# Patient Record
Sex: Male | Born: 1980 | Race: White | Hispanic: No | Smoking: Former smoker
Health system: Southern US, Community
[De-identification: ages and names within clinical notes are randomized; demographics above are authoritative.]

## PROBLEM LIST (undated history)

## (undated) ENCOUNTER — Ambulatory Visit (HOSPITAL_COMMUNITY): Admission: EM | Payer: BC Managed Care – PPO | Source: Home / Self Care

## (undated) ENCOUNTER — Ambulatory Visit (HOSPITAL_COMMUNITY): Admission: EM | Payer: BC Managed Care – PPO

## (undated) DIAGNOSIS — I10 Essential (primary) hypertension: Secondary | ICD-10-CM

## (undated) DIAGNOSIS — K259 Gastric ulcer, unspecified as acute or chronic, without hemorrhage or perforation: Secondary | ICD-10-CM

## (undated) DIAGNOSIS — J45909 Unspecified asthma, uncomplicated: Secondary | ICD-10-CM

## (undated) DIAGNOSIS — B1920 Unspecified viral hepatitis C without hepatic coma: Secondary | ICD-10-CM

---

## 2002-08-26 ENCOUNTER — Emergency Department (HOSPITAL_COMMUNITY): Admission: EM | Admit: 2002-08-26 | Discharge: 2002-08-26 | Payer: Self-pay | Admitting: Emergency Medicine

## 2003-06-06 ENCOUNTER — Emergency Department (HOSPITAL_COMMUNITY): Admission: AD | Admit: 2003-06-06 | Discharge: 2003-06-06 | Payer: Self-pay | Admitting: Family Medicine

## 2003-12-02 ENCOUNTER — Emergency Department (HOSPITAL_COMMUNITY): Admission: EM | Admit: 2003-12-02 | Discharge: 2003-12-02 | Payer: Self-pay | Admitting: *Deleted

## 2004-01-09 ENCOUNTER — Emergency Department (HOSPITAL_COMMUNITY): Admission: EM | Admit: 2004-01-09 | Discharge: 2004-01-10 | Payer: Self-pay

## 2004-01-12 ENCOUNTER — Emergency Department (HOSPITAL_COMMUNITY): Admission: EM | Admit: 2004-01-12 | Discharge: 2004-01-12 | Payer: Self-pay | Admitting: Emergency Medicine
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
## 2007-02-13 ENCOUNTER — Emergency Department (HOSPITAL_COMMUNITY): Admission: EM | Admit: 2007-02-13 | Discharge: 2007-02-13 | Payer: Self-pay | Admitting: Emergency Medicine

## 2009-10-03 ENCOUNTER — Emergency Department (HOSPITAL_BASED_OUTPATIENT_CLINIC_OR_DEPARTMENT_OTHER): Admission: EM | Admit: 2009-10-03 | Discharge: 2009-10-03 | Payer: Self-pay | Admitting: Emergency Medicine

## 2010-04-16 ENCOUNTER — Encounter: Payer: Self-pay | Admitting: Gastroenterology

## 2010-06-11 LAB — DIFFERENTIAL
Basophils Absolute: 0 10*3/uL (ref 0.0–0.1)
Basophils Relative: 0 % (ref 0–1)
Eosinophils Absolute: 0.1 10*3/uL (ref 0.0–0.7)
Eosinophils Relative: 1 % (ref 0–5)
Lymphocytes Relative: 13 % (ref 12–46)
Lymphs Abs: 1.5 10*3/uL (ref 0.7–4.0)
Monocytes Absolute: 0.7 10*3/uL (ref 0.1–1.0)
Monocytes Relative: 5 % (ref 3–12)
Neutro Abs: 9.9 10*3/uL — ABNORMAL HIGH (ref 1.7–7.7)
Neutrophils Relative %: 81 % — ABNORMAL HIGH (ref 43–77)

## 2010-06-11 LAB — COMPREHENSIVE METABOLIC PANEL
ALT: 10 U/L (ref 0–53)
AST: 19 U/L (ref 0–37)
Albumin: 4.2 g/dL (ref 3.5–5.2)
Alkaline Phosphatase: 76 U/L (ref 39–117)
BUN: 13 mg/dL (ref 6–23)
CO2: 28 mEq/L (ref 19–32)
Calcium: 9.1 mg/dL (ref 8.4–10.5)
Chloride: 106 mEq/L (ref 96–112)
Creatinine, Ser: 0.9 mg/dL (ref 0.4–1.5)
GFR calc Af Amer: >60 mL/min (ref >60)
GFR calc non Af Amer: >60 mL/min (ref >60)
Glucose, Bld: 108 mg/dL — ABNORMAL HIGH (ref 70–99)
Potassium: 4.5 mEq/L (ref 3.5–5.1)
Sodium: 143 mEq/L (ref 135–145)
Total Bilirubin: 0.9 mg/dL (ref 0.3–1.2)
Total Protein: 7.6 g/dL (ref 6.0–8.3)

## 2010-06-11 LAB — CBC
HCT: 47.3 % (ref 39.0–52.0)
Hemoglobin: 15.8 g/dL (ref 13.0–17.0)
MCH: 30.7 pg (ref 26.0–34.0)
MCHC: 33.4 g/dL (ref 30.0–36.0)
MCV: 91.9 fL (ref 78.0–100.0)
Platelets: 266 10*3/uL (ref 150–400)
RBC: 5.15 MIL/uL (ref 4.22–5.81)
RDW: 12.9 % (ref 11.5–15.5)
WBC: 12.2 10*3/uL — ABNORMAL HIGH (ref 4.0–10.5)

## 2010-06-11 LAB — URINALYSIS, ROUTINE W REFLEX MICROSCOPIC
Bilirubin Urine: NEGATIVE
Glucose, UA: NEGATIVE mg/dL
Hgb urine dipstick: NEGATIVE
Ketones, ur: 15 mg/dL — AB
Leukocytes, UA: NEGATIVE
Nitrite: NEGATIVE
Protein, ur: 30 mg/dL — AB
Specific Gravity, Urine: 1.028 (ref 1.005–1.030)
Urobilinogen, UA: 0.2 mg/dL (ref 0.0–1.0)
pH: 6 (ref 5.0–8.0)

## 2010-06-11 LAB — URINE MICROSCOPIC-ADD ON

## 2010-06-11 LAB — LIPASE, BLOOD: Lipase: 113 U/L (ref 23–300)

## 2011-01-02 LAB — COMPREHENSIVE METABOLIC PANEL
ALT: 16
AST: 33
Albumin: 4.4
Alkaline Phosphatase: 52
BUN: 10
CO2: 24
Calcium: 9.3
Chloride: 102
Creatinine, Ser: 0.84
GFR calc Af Amer: >60
GFR calc non Af Amer: >60
Glucose, Bld: 95
Potassium: 4
Sodium: 135
Total Bilirubin: 1.3 — ABNORMAL HIGH
Total Protein: 7.4

## 2011-01-02 LAB — POCT CARDIAC MARKERS
CKMB, poc: <1 — ABNORMAL LOW
Myoglobin, poc: 81.5
Operator id: 146091
Troponin i, poc: <0.05

## 2011-01-02 LAB — DIFFERENTIAL
Basophils Absolute: 0.1
Basophils Relative: 1
Eosinophils Absolute: 0.1 — ABNORMAL LOW
Eosinophils Relative: 1
Lymphocytes Relative: 17
Lymphs Abs: 1.7
Monocytes Absolute: 0.7
Monocytes Relative: 7
Neutro Abs: 7.5
Neutrophils Relative %: 75

## 2011-01-02 LAB — CBC
HCT: 44.2
Hemoglobin: 15.6
MCHC: 35.3
MCV: 94.1
Platelets: 294
RBC: 4.69
RDW: 12.2
WBC: 10

## 2011-01-02 LAB — LIPASE, BLOOD: Lipase: 22

## 2011-01-02 LAB — ETHANOL: Alcohol, Ethyl (B): <5

## 2012-08-17 ENCOUNTER — Emergency Department (HOSPITAL_COMMUNITY): Payer: Self-pay

## 2012-08-17 ENCOUNTER — Encounter (HOSPITAL_COMMUNITY): Payer: Self-pay | Admitting: *Deleted

## 2012-08-17 ENCOUNTER — Emergency Department (HOSPITAL_COMMUNITY)
Admission: EM | Admit: 2012-08-17 | Discharge: 2012-08-17 | Disposition: A | Discharge status: Home / Self Care | Payer: Self-pay | Attending: Emergency Medicine | Admitting: Emergency Medicine

## 2012-08-17 DIAGNOSIS — Y939 Activity, unspecified: Secondary | ICD-10-CM | POA: Insufficient documentation

## 2012-08-17 DIAGNOSIS — F17200 Nicotine dependence, unspecified, uncomplicated: Secondary | ICD-10-CM | POA: Insufficient documentation

## 2012-08-17 DIAGNOSIS — S8990XA Unspecified injury of unspecified lower leg, initial encounter: Secondary | ICD-10-CM | POA: Insufficient documentation

## 2012-08-17 DIAGNOSIS — Y929 Unspecified place or not applicable: Secondary | ICD-10-CM | POA: Insufficient documentation

## 2012-08-17 DIAGNOSIS — M25571 Pain in right ankle and joints of right foot: Secondary | ICD-10-CM

## 2012-08-17 MED ORDER — HYDROCODONE-ACETAMINOPHEN 5-325 MG PO TABS: 1.0000 | ORAL_TABLET | ORAL | Status: DC | PRN

## 2012-08-17 MED ORDER — OXYCODONE-ACETAMINOPHEN 5-325 MG PO TABS
2.0000 | ORAL_TABLET | Freq: Once | ORAL | Status: AC
  Administered 2012-08-17: 2 via ORAL
  Filled 2012-08-17: qty 2
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
## 2012-08-17 NOTE — Progress Notes (Signed)
Orthopedic Tech Progress Note Patient Details:  Jared [name] 01-22-1981 [id number] Ace wrap applied to Right ankle. Crutches fit for patient height and comfort.  Ortho Devices Type of Ortho Device: Ace wrap;Crutches Ortho Device/Splint Interventions: Application   Asia R Thompson 08/17/2012, 12:34 PM

## 2012-08-20 NOTE — ED Provider Notes (Signed)
Medical screening examination/treatment/procedure(s) were performed by non-physician practitioner and as supervising physician I was immediately available for consultation/collaboration.   Carleene Cooper III, MD 08/20/12 [phone]

## 2013-12-10 ENCOUNTER — Emergency Department (HOSPITAL_COMMUNITY)
Admission: EM | Admit: 2013-12-10 | Discharge: 2013-12-10 | Disposition: A | Discharge status: Home / Self Care | Payer: No Typology Code available for payment source | Attending: Emergency Medicine | Admitting: Emergency Medicine

## 2013-12-10 ENCOUNTER — Encounter (HOSPITAL_COMMUNITY): Payer: Self-pay | Admitting: Emergency Medicine

## 2013-12-10 DIAGNOSIS — K089 Disorder of teeth and supporting structures, unspecified: Secondary | ICD-10-CM | POA: Insufficient documentation

## 2013-12-10 DIAGNOSIS — Z791 Long term (current) use of non-steroidal anti-inflammatories (NSAID): Secondary | ICD-10-CM | POA: Insufficient documentation

## 2013-12-10 DIAGNOSIS — K047 Periapical abscess without sinus: Secondary | ICD-10-CM | POA: Insufficient documentation

## 2013-12-10 DIAGNOSIS — F17200 Nicotine dependence, unspecified, uncomplicated: Secondary | ICD-10-CM | POA: Insufficient documentation

## 2013-12-10 DIAGNOSIS — R110 Nausea: Secondary | ICD-10-CM | POA: Insufficient documentation

## 2013-12-10 HISTORY — DX: Unspecified viral hepatitis C without hepatic coma: B19.20

## 2013-12-10 HISTORY — DX: Unspecified asthma, uncomplicated: J45.909

## 2013-12-10 MED ORDER — ONDANSETRON 4 MG PO TBDP
8.0000 mg | ORAL_TABLET | Freq: Once | ORAL | Status: AC
  Administered 2013-12-10: 8 mg via ORAL
  Filled 2013-12-10: qty 2

## 2013-12-10 MED ORDER — OXYCODONE HCL 5 MG PO TABS: 5.0000 mg | ORAL_TABLET | Freq: Four times a day (QID) | ORAL | Status: DC | PRN

## 2013-12-10 MED ORDER — LORAZEPAM 2 MG/ML IJ SOLN
1.0000 mg | Freq: Once | INTRAMUSCULAR | Status: AC
  Administered 2013-12-10: 1 mg via INTRAMUSCULAR
  Filled 2013-12-10: qty 1

## 2013-12-10 MED ORDER — CLINDAMYCIN HCL 150 MG PO CAPS: 300.0000 mg | ORAL_CAPSULE | Freq: Three times a day (TID) | ORAL | Status: DC

## 2013-12-10 MED ORDER — HYDROMORPHONE HCL 1 MG/ML IJ SOLN
1.0000 mg | Freq: Once | INTRAMUSCULAR | Status: AC
Start: 2013-12-10 — End: 2013-12-10
  Administered 2013-12-10: 1 mg via INTRAMUSCULAR
  Filled 2013-12-10: qty 1

## 2013-12-10 MED ORDER — OXYCODONE-ACETAMINOPHEN 5-325 MG PO TABS: 1.0000 | ORAL_TABLET | Freq: Four times a day (QID) | ORAL | Status: DC | PRN
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
## 2013-12-10 NOTE — Discharge Instructions (Signed)
Dental Abscess A dental abscess is a collection of infected fluid (pus) from a bacterial infection in the inner part of the tooth (pulp). It usually occurs at the end of the tooth's root.  CAUSES   Severe tooth decay.  Trauma to the tooth that allows bacteria to enter into the pulp, such as a broken or chipped tooth. SYMPTOMS   Severe pain in and around the infected tooth.  Swelling and redness around the abscessed tooth or in the mouth or face.  Tenderness.  Pus drainage.  Bad breath.  Bitter taste in the mouth.  Difficulty swallowing.  Difficulty opening the mouth.  Nausea.  Vomiting.  Chills.  Swollen neck glands. DIAGNOSIS   A medical and dental history will be taken.  An examination will be performed by tapping on the abscessed tooth.  X-rays may be taken of the tooth to identify the abscess. TREATMENT The goal of treatment is to eliminate the infection. You may be prescribed antibiotic medicine to stop the infection from spreading. A root canal may be performed to save the tooth. If the tooth cannot be saved, it may be pulled (extracted) and the abscess may be drained.  HOME CARE INSTRUCTIONS  Only take over-the-counter or prescription medicines for pain, fever, or discomfort as directed by your caregiver.  Rinse your mouth (gargle) often with salt water ( tsp salt in 8 oz [250 ml] of warm water) to relieve pain or swelling.  Do not drive after taking pain medicine (narcotics).  Do not apply heat to the outside of your face.  Return to your dentist for further treatment as directed. SEEK MEDICAL CARE IF:  Your pain is not helped by medicine.  Your pain is getting worse instead of better. SEEK IMMEDIATE MEDICAL CARE IF:  You have a fever or persistent symptoms for more than 2-3 days.  You have a fever and your symptoms suddenly get worse.  You have chills or a very bad headache.  You have problems breathing or swallowing.  You have trouble  opening your mouth.  You have swelling in the neck or around the eye. Document Released: 03/12/2005 Document Revised: 12/05/2011 Document Reviewed: 06/20/2010 [facility] Patient Information 2015 [place], [place]. This information is not intended to replace advice given to you by your health care provider. Make sure you discuss any questions you have with your health care provider.  Return to the emergency room for fever, change in vision, redness to the face that rapidly spreads towards the eye, nausea or vomiting, difficulty swallowing or shortness of breath.  You may follow with the dentist listed above. Alternatively,  you may also contact Onalee Hua and Nuala Alpha who run a low-cost dental clinic at their phone number is [phone]. You may also call [phone]  Dental Assistance If the dentist on-call cannot see you, please use the resources below:   Patients with Medicaid: [facility] [phone] [address], [phone] [address] [phone]  If unable to pay, or uninsured, contact [facility] [phone]) or [facility] [phone] in [place], [phone] in [facility]) to become qualified for the adult dental clinic  Other Low-Cost Community Dental Services: [facility]- [address] Natasha Bence [place], [place], [address]    [phone], Ext. [phone]    2nd and 4th Thursday of the month at 6:30am    10 clients each day by appointment, can sometimes see walk-in     patients if someone does not show for an appointment [facility]  Center- [address] Ether Griffins [place], [place], [address]    [phone] [facility] [address], [place], [place], [address]    [phone]  [facility] Department- [phone] [facility] Department- [phone] [facility] [phone]

## 2013-12-10 NOTE — ED Notes (Signed)
Patient states abscessed tooth left lower broken tooth.   Patient states he has been doubling up on the pain medication he got from [facility] on 12/06/2013.

## 2013-12-11 NOTE — ED Provider Notes (Signed)
Medical screening examination/treatment/procedure(s) were performed by non-physician practitioner and as supervising physician I was immediately available for consultation/collaboration.  Khadeeja Elden, MD 12/11/13 0711 

## 2013-12-12 ENCOUNTER — Emergency Department (HOSPITAL_COMMUNITY)
Admission: EM | Admit: 2013-12-12 | Discharge: 2013-12-12 | Disposition: A | Discharge status: Home / Self Care | Payer: No Typology Code available for payment source | Attending: Emergency Medicine | Admitting: Emergency Medicine

## 2013-12-12 ENCOUNTER — Encounter (HOSPITAL_COMMUNITY): Payer: Self-pay | Admitting: Emergency Medicine

## 2013-12-12 DIAGNOSIS — F17200 Nicotine dependence, unspecified, uncomplicated: Secondary | ICD-10-CM | POA: Insufficient documentation

## 2013-12-12 DIAGNOSIS — Z8619 Personal history of other infectious and parasitic diseases: Secondary | ICD-10-CM | POA: Insufficient documentation

## 2013-12-12 DIAGNOSIS — M5430 Sciatica, unspecified side: Secondary | ICD-10-CM | POA: Insufficient documentation

## 2013-12-12 DIAGNOSIS — J45909 Unspecified asthma, uncomplicated: Secondary | ICD-10-CM | POA: Insufficient documentation

## 2013-12-12 DIAGNOSIS — M549 Dorsalgia, unspecified: Secondary | ICD-10-CM | POA: Insufficient documentation

## 2013-12-12 DIAGNOSIS — Z792 Long term (current) use of antibiotics: Secondary | ICD-10-CM | POA: Insufficient documentation

## 2013-12-12 DIAGNOSIS — M5440 Lumbago with sciatica, unspecified side: Secondary | ICD-10-CM

## 2013-12-12 DIAGNOSIS — Z79899 Other long term (current) drug therapy: Secondary | ICD-10-CM | POA: Insufficient documentation

## 2013-12-12 DIAGNOSIS — Z87828 Personal history of other (healed) physical injury and trauma: Secondary | ICD-10-CM | POA: Insufficient documentation

## 2013-12-12 MED ORDER — IBUPROFEN 400 MG PO TABS: 800.0000 mg | ORAL_TABLET | Freq: Once | ORAL | Status: DC

## 2013-12-12 MED ORDER — KETOROLAC TROMETHAMINE 60 MG/2ML IM SOLN
60.0000 mg | Freq: Once | INTRAMUSCULAR | Status: AC
  Administered 2013-12-12: 60 mg via INTRAMUSCULAR
  Filled 2013-12-12: qty 2

## 2013-12-12 MED ORDER — METHOCARBAMOL 500 MG PO TABS: 500.0000 mg | ORAL_TABLET | Freq: Two times a day (BID) | ORAL | Status: DC

## 2013-12-12 MED ORDER — IBUPROFEN 800 MG PO TABS: 800.0000 mg | ORAL_TABLET | Freq: Three times a day (TID) | ORAL | Status: DC

## 2013-12-12 NOTE — Discharge Instructions (Signed)
Followup with your primary care physician for your back pain. Refer to resource guide below to find primary care physician. Followup with orthopedics.  Sciatica Sciatica is pain, weakness, numbness, or tingling along the path of the sciatic nerve. The nerve starts in the lower back and runs down the back of each leg. The nerve controls the muscles in the lower leg and in the back of the knee, while also providing sensation to the back of the thigh, lower leg, and the sole of your foot. Sciatica is a symptom of another medical condition. For instance, nerve damage or certain conditions, such as a herniated disk or bone spur on the spine, pinch or put pressure on the sciatic nerve. This causes the pain, weakness, or other sensations normally associated with sciatica. Generally, sciatica only affects one side of the body. CAUSES   Herniated or slipped disc.  Degenerative disk disease.  A pain disorder involving the narrow muscle in the buttocks (piriformis syndrome).  Pelvic injury or fracture.  Pregnancy.  Tumor (rare). SYMPTOMS  Symptoms can vary from mild to very severe. The symptoms usually travel from the low back to the buttocks and down the back of the leg. Symptoms can include:  Mild tingling or dull aches in the lower back, leg, or hip.  Numbness in the back of the calf or sole of the foot.  Burning sensations in the lower back, leg, or hip.  Sharp pains in the lower back, leg, or hip.  Leg weakness.  Severe back pain inhibiting movement. These symptoms may get worse with coughing, sneezing, laughing, or prolonged sitting or standing. Also, being overweight may worsen symptoms. DIAGNOSIS  Your caregiver will perform a physical exam to look for common symptoms of sciatica. He or she may ask you to do certain movements or activities that would trigger sciatic nerve pain. Other tests may be performed to find the cause of the sciatica. These may include:  Blood  tests.  X-rays.  Imaging tests, such as an MRI or CT scan. TREATMENT  Treatment is directed at the cause of the sciatic pain. Sometimes, treatment is not necessary and the pain and discomfort goes away on its own. If treatment is needed, your caregiver may suggest:  Over-the-counter medicines to relieve pain.  Prescription medicines, such as anti-inflammatory medicine, muscle relaxants, or narcotics.  Applying heat or ice to the painful area.  Steroid injections to lessen pain, irritation, and inflammation around the nerve.  Reducing activity during periods of pain.  Exercising and stretching to strengthen your abdomen and improve flexibility of your spine. Your caregiver may suggest losing weight if the extra weight makes the back pain worse.  Physical therapy.  Surgery to eliminate what is pressing or pinching the nerve, such as a bone spur or part of a herniated disk. HOME CARE INSTRUCTIONS   Only take over-the-counter or prescription medicines for pain or discomfort as directed by your caregiver.  Apply ice to the affected area for 20 minutes, 3-4 times a day for the first 48-72 hours. Then try heat in the same way.  Exercise, stretch, or perform your usual activities if these do not aggravate your pain.  Attend physical therapy sessions as directed by your caregiver.  Keep all follow-up appointments as directed by your caregiver.  Do not wear high heels or shoes that do not provide proper support.  Check your mattress to see if it is too soft. A firm mattress may lessen your pain and discomfort. SEEK IMMEDIATE MEDICAL  CARE IF:   You lose control of your bowel or bladder (incontinence).  You have increasing weakness in the lower back, pelvis, buttocks, or legs.  You have redness or swelling of your back.  You have a burning sensation when you urinate.  You have pain that gets worse when you lie down or awakens you at night.  Your pain is worse than you have  experienced in the past.  Your pain is lasting longer than 4 weeks.  You are suddenly losing weight without reason. MAKE SURE YOU:  Understand these instructions.  Will watch your condition.  Will get help right away if you are not doing well or get worse. Document Released: 03/06/2001 Document Revised: 09/11/2011 Document Reviewed: 07/22/2011 [facility] Patient Information 2015 [place], [place]. This information is not intended to replace advice given to you by your health care provider. Make sure you discuss any questions you have with your health care provider.   Emergency Department Resource Guide 1) Find a Doctor and Pay Out of Pocket Although you won't have to find out who is covered by your insurance plan, it is a good idea to ask around and get recommendations. You will then need to call the office and see if the doctor you have chosen will accept you as a new patient and what types of options they offer for patients who are self-pay. Some doctors offer discounts or will set up payment plans for their patients who do not have insurance, but you will need to ask so you aren't surprised when you get to your appointment.  2) Contact Your Local Health Department Not all health departments have doctors that can see patients for sick visits, but many do, so it is worth a call to see if yours does. If you don't know where your local health department is, you can check in your phone book. The CDC also has a tool to help you locate your state's health department, and many state websites also have listings of all of their local health departments.  3) Find a Walk-in Clinic If your illness is not likely to be very severe or complicated, you may want to try a walk in clinic. These are popping up all over the country in pharmacies, drugstores, and shopping centers. They're usually staffed by nurse practitioners or physician assistants that have been trained to treat common illnesses and complaints.  They're usually fairly quick and inexpensive. However, if you have serious medical issues or chronic medical problems, these are probably not your best option.  No Primary Care Doctor: - Call Health Connect at  [phone] - they can help you locate a primary care doctor that  accepts your insurance, provides certain services, etc. - Physician Referral Service- [phone]  Chronic Pain Problems: Organization         Address  Phone   Notes  [facility]  [phone] Patients need to be referred by their primary care doctor.   Medication Assistance: Organization         Address  Phone   Notes  [facility] Medication [facility] [address] [place] [place], [place] [address] [phone] --Must be a resident of [facility] -- Must have NO insurance coverage whatsoever (no Medicaid/ Medicare, etc.) -- The pt. MUST have a primary care doctor that directs their care regularly and follows them in the community   [facility]  [phone]   [employer]  [phone]    Agencies that  provide inexpensive medical care: Organization         Address  Phone   Notes  [facility]  [phone]   [facility]    [phone]   [facility] [address] [place], [place] [address] [phone]   [facility] [address] [place] [phone]   Planned Parenthood    [phone]   [facility]    [phone]   [facility]  [address], [place] Phone:  [phone], Fax:  [phone] Hours of Operation:  9 am - 6 pm, M-F.  Also accepts Medicaid/Medicare and self-pay.  [facility]  [address], [place] Phone: [phone], Fax: [phone]. Hours of Operation:  8:30 am - 5:30 pm, M-F.  Also accepts Medicaid and self-pay.  [facility] [place] [address] [place]  Phone: [phone]   [facility] [address] Natasha Bence [place], [place] [phone], Ext. [phone] Mondays & Thursdays: 7-9 AM.  First 15 patients are seen on a first come, first serve basis.    Medicaid-accepting [facility] Providers:  Organization         Address  Phone   Notes  [facility] [address],  [phone] Also accepts self-pay patients.  [facility] [address] Laurell Josephs [place], [place]  [phone]   [facility] [address], [place] [phone]   [facility] [address], [place] [phone]   Renaye Rakers [address], [place]   [phone] Only accepts [place] Access [place] patients after they have their name applied to their card.   Self-Pay (no insurance) in [facility]:  Organization         Address  Phone   Notes  Sickle Cell Patients, [facility] Internal Medicine [address] [place], [place] [phone]   [facility] [address] [place], [place] [phone]   [facility] [place]  [address], [place] [phone]   [facility]/Dr. Osei-Bonsu  [address] [place] or [address], [place] [phone] Phone number for both [place] and [place] locations is the same.  Urgent Medical and [facility] [address] [place] [phone]   [facility] [address], [place] or [address] [phone]   [facility] [address], [place] [phone], phone; [phone], fax Sees patients 1st and 3rd Saturday of every month.  Must not qualify for public or private insurance (i.e. Medicaid, Medicare, [place] Health Choice, Veterans' Benefits)  Household income should be no more than 200% of the poverty level The clinic cannot  treat you if you are pregnant or think you are pregnant  Sexually transmitted diseases are not treated at the clinic.    Dental Care: Organization         Address  Phone  Notes  [facility] Department of [facility] [address] [place], [place] [phone] Accepts children up to age 73 who are enrolled in [place] or [place] Health Choice; pregnant women with a Medicaid card; and children who have applied for Medicaid or [place] Health  Choice, but were declined, whose parents can pay a reduced fee at time of service.  [facility] Department of [facility]  [address], [place] [phone] Accepts children up to age 65 who are enrolled in [place] or [place] Health Choice; pregnant women with a Medicaid card; and children who have applied for Medicaid or [place] Health Choice, but were declined, whose parents can pay a reduced fee at time of service.  [facility]  [address] [place], [place] [phone] Patients are seen by appointment only. Walk-ins are not accepted. [facility] will see patients 72 years of age and older. Monday - Tuesday (8am-5pm) Most Wednesdays (8:30-5pm) $30 per visit, cash only  [facility] Access [facility]  [address], [facility] [phone] Patients are seen by appointment only. Walk-ins are not accepted. [facility] will see patients 34 years of age and older. One Wednesday Evening (Monthly: Volunteer Based).  $30 per visit, cash only  [employer]  [phone] for adults; Children under age 39, call Graduate Pediatric Dentistry at [phone]. Children aged 56-14, please call [phone] to request a pediatric application.  Dental services are provided in all areas of dental care including fillings, crowns and bridges, complete and partial dentures, implants, gum treatment, root canals, and extractions. Preventive care is also provided.  Treatment is provided to both adults and children. Patients are selected via a lottery and there is often a waiting list.   [facility] [address], [place]  [phone] [email or web address]   [facility] [address] [place], [place] [phone], Ext. [phone] Second and Fourth Thursday of each month, opens at 6:30 AM; Clinic ends at 9 AM.  Patients are seen on a first-come first-served basis, and a limited number are seen during each clinic.   [facility]  [address] Ether Griffins [place], [place] [phone]   Eligibility Requirements You must have lived in [place], [place], or [place] counties for at least the last three months.   You cannot be eligible for state or federal sponsored [place], including [employer], [place], or [employer].   You generally cannot be eligible for healthcare insurance through your employer.    How to apply: Eligibility screenings are held every Tuesday and Wednesday afternoon from 1:00 pm until 4:00 pm. You do not need an appointment for the interview!  [facility] [address], [place], [place] [phone]   [facility] Health Department  [phone]   [facility] Health Department  [phone]   [facility]  [phone]    Behavioral Health Resources in the Community: Intensive Outpatient Programs Organization         Address  Phone  Notes  [facility] [address] [place], [place] [phone]   [facility] Outpatient [address] [place], [place] [phone]   [facility] [address], [place], [place]  [phone]   [facility] Mental Health [address]  [place], [place] [phone] or [phone]   Substance Abuse Resources Organization         Address  Phone  Notes  [facility]  [phone]   [facility]   [phone]   [facility] [place]  [phone]   Floydene Flock  [phone]   Residential & Outpatient Substance Abuse Program  [phone]  Psychological Services Organization         Address  Phone  Notes  [facility] Health  [phone]   [facility]  [phone]   [facility] Mental Health [phone], [place] [phone] or [phone]    Mobile Crisis Teams Organization         Address  Phone  Notes  [facility], Mobile Crisis Care Unit  [phone]   [facility]  [address] [place], [place] [phone]   Doristine Locks [address] [place] [phone]    Self-Help/Support Groups Organization         Address  Phone             Notes  [facility] [place] - variety of support groups  336- [phone] Call for more information  Narcotics Anonymous (NA), [facility] [address], [employer] [place]  2 meetings at this location   Statistician         Address  Phone  Notes  [facility] [address],    [place]  [phone]   [facility]  [address] [place] [address], [place], [place] [phone]   [facility] [address] [place], [place] [phone] Admissions: 8am-3pm M-F  [facility] [address]    [place], [place] [phone]   [facility] [address] [place], [place], [place] [phone]   [facility] [address]  [place], [place] [phone]   [facility] - Intensive Outpatient [address] Laurell Josephs [address], [place], [place] [phone]   [facility] ([facility] [address] [place]  [place], [place] [phone] or [phone]   [facility] ([facility]) [address] [place], [place] [phone] Accepts Medicaid  [facility] [address],  [place] [phone] Substance  Abuse/Addiction Treatment   [facility] Organization         Address  Phone  Notes  [facility]  [phone]   Angie Fava, PhD [address] Ervin Knack [place], [place]   [phone] or [phone]   [facility] Behavioral   [address] [place], [place] [phone]   [facility] [address], [place], [place] [phone] Insurance/Medicaid/sponsorship through [facility] and Families [address]                                    [place], [place] [phone] Therapy/tele-psych/case  [facility] [address], [place] [phone]    Dr. Lolly Mustache  [phone]   [facility]  [employer] [facility] Dept. 1) [address], [place] 2) [address] [place] 3)  [address], [place] [phone]  [phone]   [facility] Child Abuse Hotline (321)082-4447 or [phone] (After Hours)
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
## 2013-12-12 NOTE — ED Notes (Signed)
Case manager at bed side to give PT letter for match program. Pt instructed to go to [facility] on [address].

## 2013-12-13 NOTE — ED Provider Notes (Signed)
Medical screening examination/treatment/procedure(s) were performed by non-physician practitioner and as supervising physician I was immediately available for consultation/collaboration.   EKG Interpretation None        Candyce Churn III, MD 12/13/13 1535

## 2013-12-18 ENCOUNTER — Emergency Department (HOSPITAL_COMMUNITY)
Admission: EM | Admit: 2013-12-18 | Discharge: 2013-12-18 | Disposition: A | Discharge status: Home / Self Care | Payer: Self-pay | Attending: Emergency Medicine | Admitting: Emergency Medicine

## 2013-12-18 ENCOUNTER — Emergency Department (HOSPITAL_COMMUNITY): Payer: No Typology Code available for payment source

## 2013-12-18 ENCOUNTER — Encounter (HOSPITAL_COMMUNITY): Payer: Self-pay | Admitting: Emergency Medicine

## 2013-12-18 DIAGNOSIS — J45909 Unspecified asthma, uncomplicated: Secondary | ICD-10-CM | POA: Insufficient documentation

## 2013-12-18 DIAGNOSIS — R748 Abnormal levels of other serum enzymes: Secondary | ICD-10-CM | POA: Insufficient documentation

## 2013-12-18 DIAGNOSIS — R1084 Generalized abdominal pain: Secondary | ICD-10-CM | POA: Insufficient documentation

## 2013-12-18 DIAGNOSIS — Z8619 Personal history of other infectious and parasitic diseases: Secondary | ICD-10-CM | POA: Insufficient documentation

## 2013-12-18 DIAGNOSIS — R112 Nausea with vomiting, unspecified: Secondary | ICD-10-CM | POA: Insufficient documentation

## 2013-12-18 DIAGNOSIS — Z791 Long term (current) use of non-steroidal anti-inflammatories (NSAID): Secondary | ICD-10-CM | POA: Insufficient documentation

## 2013-12-18 DIAGNOSIS — R197 Diarrhea, unspecified: Secondary | ICD-10-CM | POA: Insufficient documentation

## 2013-12-18 DIAGNOSIS — Z87891 Personal history of nicotine dependence: Secondary | ICD-10-CM | POA: Insufficient documentation

## 2013-12-18 DIAGNOSIS — Z792 Long term (current) use of antibiotics: Secondary | ICD-10-CM | POA: Insufficient documentation

## 2013-12-18 DIAGNOSIS — Z79899 Other long term (current) drug therapy: Secondary | ICD-10-CM | POA: Insufficient documentation

## 2013-12-18 DIAGNOSIS — K044 Acute apical periodontitis of pulpal origin: Secondary | ICD-10-CM | POA: Insufficient documentation

## 2013-12-18 HISTORY — DX: Gastric ulcer, unspecified as acute or chronic, without hemorrhage or perforation: K25.9

## 2013-12-18 LAB — COMPREHENSIVE METABOLIC PANEL
ALT: 48 U/L (ref 0–53)
AST: 32 U/L (ref 0–37)
Albumin: 4.5 g/dL (ref 3.5–5.2)
Alkaline Phosphatase: 71 U/L (ref 39–117)
Anion gap: 13 (ref 5–15)
BUN: 15 mg/dL (ref 6–23)
CALCIUM: 9.8 mg/dL (ref 8.4–10.5)
CO2: 27 mEq/L (ref 19–32)
Chloride: 102 mEq/L (ref 96–112)
Creatinine, Ser: 0.81 mg/dL (ref 0.50–1.35)
GFR calc Af Amer: >90 mL/min (ref >90)
GFR calc non Af Amer: >90 mL/min (ref >90)
Glucose, Bld: 103 mg/dL — ABNORMAL HIGH (ref 70–99)
Potassium: 4.5 mEq/L (ref 3.7–5.3)
SODIUM: 142 meq/L (ref 137–147)
TOTAL PROTEIN: 8.3 g/dL (ref 6.0–8.3)
Total Bilirubin: 0.5 mg/dL (ref 0.3–1.2)

## 2013-12-18 LAB — CBC WITH DIFFERENTIAL/PLATELET
BASOS ABS: 0 10*3/uL (ref 0.0–0.1)
BASOS PCT: 0 % (ref 0–1)
EOS ABS: 0.1 10*3/uL (ref 0.0–0.7)
Eosinophils Relative: 1 % (ref 0–5)
HCT: 45.4 % (ref 39.0–52.0)
Hemoglobin: 16.2 g/dL (ref 13.0–17.0)
Lymphocytes Relative: 20 % (ref 12–46)
Lymphs Abs: 1.9 10*3/uL (ref 0.7–4.0)
MCH: 31 pg (ref 26.0–34.0)
MCHC: 35.7 g/dL (ref 30.0–36.0)
MCV: 87 fL (ref 78.0–100.0)
Monocytes Absolute: 0.6 10*3/uL (ref 0.1–1.0)
Monocytes Relative: 6 % (ref 3–12)
Neutro Abs: 7.1 10*3/uL (ref 1.7–7.7)
Neutrophils Relative %: 73 % (ref 43–77)
PLATELETS: 281 10*3/uL (ref 150–400)
RBC: 5.22 MIL/uL (ref 4.22–5.81)
RDW: 12.2 % (ref 11.5–15.5)
WBC: 9.7 10*3/uL (ref 4.0–10.5)

## 2013-12-18 LAB — URINALYSIS, ROUTINE W REFLEX MICROSCOPIC
Bilirubin Urine: NEGATIVE
GLUCOSE, UA: NEGATIVE mg/dL
Hgb urine dipstick: NEGATIVE
Ketones, ur: 40 mg/dL — AB
LEUKOCYTES UA: NEGATIVE
Nitrite: NEGATIVE
PROTEIN: NEGATIVE mg/dL
Specific Gravity, Urine: 1.02 (ref 1.005–1.030)
Urobilinogen, UA: 0.2 mg/dL (ref 0.0–1.0)
pH: 7.5 (ref 5.0–8.0)

## 2013-12-18 LAB — LIPASE, BLOOD: Lipase: 62 U/L — ABNORMAL HIGH (ref 11–59)

## 2013-12-18 MED ORDER — IOHEXOL 300 MG/ML  SOLN
100.0000 mL | Freq: Once | INTRAMUSCULAR | Status: AC | PRN
  Administered 2013-12-18: 100 mL via INTRAVENOUS

## 2013-12-18 MED ORDER — ONDANSETRON 8 MG PO TBDP: 8.0000 mg | ORAL_TABLET | Freq: Three times a day (TID) | ORAL | Status: DC | PRN

## 2013-12-18 MED ORDER — METOCLOPRAMIDE HCL 10 MG PO TABS: 10.0000 mg | ORAL_TABLET | Freq: Four times a day (QID) | ORAL | Status: DC

## 2013-12-18 MED ORDER — HYDROMORPHONE HCL 1 MG/ML IJ SOLN
1.0000 mg | Freq: Once | INTRAMUSCULAR | Status: AC
  Administered 2013-12-18: 1 mg via INTRAVENOUS
  Filled 2013-12-18: qty 1

## 2013-12-18 MED ORDER — ONDANSETRON HCL 4 MG/2ML IJ SOLN
4.0000 mg | Freq: Once | INTRAMUSCULAR | Status: AC
Start: 2013-12-18 — End: 2013-12-18
  Administered 2013-12-18: 4 mg via INTRAVENOUS
  Filled 2013-12-18: qty 2

## 2013-12-18 MED ORDER — SODIUM CHLORIDE 0.9 % IV BOLUS (SEPSIS)
1000.0000 mL | Freq: Once | INTRAVENOUS | Status: AC
  Administered 2013-12-18: 1000 mL via INTRAVENOUS

## 2013-12-18 MED ORDER — PROMETHAZINE HCL 25 MG/ML IJ SOLN
25.0000 mg | Freq: Once | INTRAMUSCULAR | Status: AC
  Administered 2013-12-18: 25 mg via INTRAVENOUS
  Filled 2013-12-18: qty 1

## 2013-12-18 MED ORDER — IOHEXOL 300 MG/ML  SOLN
50.0000 mL | Freq: Once | INTRAMUSCULAR | Status: AC | PRN
  Administered 2013-12-18: 50 mL via ORAL

## 2013-12-18 NOTE — ED Notes (Signed)
MD at bedside. 

## 2013-12-18 NOTE — Progress Notes (Signed)
[facility] [employer],   Provided pt with a list of primary care resources and [facility] Card application to help patient establish primary care.

## 2013-12-18 NOTE — ED Notes (Signed)
Pt c/o generalized abdominal pain x 4 days and n/v/d x 2 days.  Pain score 9/10.  Pt reports he has had the pain previously and was told it was "Hep C flaring up or a liver issue."  Pt sts he has not taken anything to relieve pain.

## 2013-12-18 NOTE — ED Notes (Signed)
Patient transported to CT 

## 2013-12-18 NOTE — Discharge Instructions (Signed)
Abdominal Pain Many things can cause abdominal pain. Usually, abdominal pain is not caused by a disease and will improve without treatment. It can often be observed and treated at home. Your health care provider will do a physical exam and possibly order blood tests and X-rays to help determine the seriousness of your pain. However, in many cases, more time must pass before a clear cause of the pain can be found. Before that point, your health care provider may not know if you need more testing or further treatment. HOME CARE INSTRUCTIONS  Monitor your abdominal pain for any changes. The following actions may help to alleviate any discomfort you are experiencing:  Only take over-the-counter or prescription medicines as directed by your health care provider.  Do not take laxatives unless directed to do so by your health care provider.  Try a clear liquid diet (broth, tea, or water) as directed by your health care provider. Slowly move to a bland diet as tolerated. SEEK MEDICAL CARE IF:  You have unexplained abdominal pain.  You have abdominal pain associated with nausea or diarrhea.  You have pain when you urinate or have a bowel movement.  You experience abdominal pain that wakes you in the night.  You have abdominal pain that is worsened or improved by eating food.  You have abdominal pain that is worsened with eating fatty foods.  You have a fever. SEEK IMMEDIATE MEDICAL CARE IF:   Your pain does not go away within 2 hours.  You keep throwing up (vomiting).  Your pain is felt only in portions of the abdomen, such as the right side or the left lower portion of the abdomen.  You pass bloody or black tarry stools. MAKE SURE YOU:  Understand these instructions.   Will watch your condition.   Will get help right away if you are not doing well or get worse.  Document Released: 12/20/2004 Document Revised: 03/17/2013 Document Reviewed: 11/19/2012 [facility] Patient Information  2015 [place], [place]. This information is not intended to replace advice given to you by your health care provider. Make sure you discuss any questions you have with your health care provider. Clear Liquid Diet A clear liquid diet is a short-term diet. It is made up of liquids or solids that become liquids at room temperature. You should be able to see through the liquid. WHAT CAN I HAVE? You may have any of the following if your doctor says they are okay:  Vegetable juices or fruit juices that do not have pulp.  Coffee.  Tea.  Soda.  Clear broth or strained broth-based soups.  High-protein gelatins and flavored gelatins.  Sugar and honey.  Ices or frozen ice pops that do not have milk. If you are not sure whether you can have a certain liquid, ask your doctor. You may also ask your doctor about other clear liquid options. Document Released: 02/23/2008 Document Revised: 03/17/2013 Document Reviewed: 02/06/2013 [facility] Patient Information 2015 [place], [place]. This information is not intended to replace advice given to you by your health care provider. Make sure you discuss any questions you have with your health care provider.

## 2013-12-18 NOTE — ED Provider Notes (Addendum)
CSN: [id number]     Arrival date & time 12/18/13  1191 History   First MD Initiated Contact with Patient 12/18/13 [phone]     Chief Complaint  Patient presents with  . Abdominal Pain  . Emesis  . Diarrhea     (Consider location/radiation/quality/duration/timing/severity/associated sxs/prior Treatment) HPI 33 y.o. Male with abdominal pain, nausea, vomiting diarrhea for four days.  S/p root canal 4 days and on clindamycin.  States pain started first and points to diffuse upper abdomen.  States vomiting everything he puts in his mouth for two days.  Emesis is food or clear.  Loose stools,non bloody.  Denies fever, chills, or painful urination but endorses frequent urination. Patient denies any previous surgeryies or similar symptoms.  Denies alcohol or tobacco use.  Past Medical History  Diagnosis Date  . Asthma   . Hepatitis C    History reviewed. No pertinent past surgical history. History reviewed. No pertinent family history. History  Substance Use Topics  . Smoking status: Former Smoker    Types: Cigarettes  . Smokeless tobacco: Not on file  . Alcohol Use: No    Review of Systems  All other systems reviewed and are negative.     Allergies  Tylenol  Home Medications   Prior to Admission medications   Medication Sig Start Date End Date Taking? Authorizing Provider  clindamycin (CLEOCIN) 150 MG capsule Take 2 capsules (300 mg total) by mouth 3 (three) times daily. May dispense as  capsules 12/10/13   Mora Bellman, PA-C  ibuprofen (ADVIL,MOTRIN) 800 MG tablet Take 1 tablet (800 mg total) by mouth 3 (three) times daily. 12/12/13   Monte Fantasia, PA-C  methocarbamol (ROBAXIN) 500 MG tablet Take 1 tablet (500 mg total) by mouth 2 (two) times daily. 12/12/13   Monte Fantasia, PA-C  oxyCODONE (ROXICODONE) 5 MG immediate release tablet Take 1 tablet (5 mg total) by mouth every 6 (six) hours as needed for severe pain. 12/10/13   Ramon Dredge Merrell, PA-C   BP 141/79  Pulse 60   Temp(Src) 97.9 F (36.6 C) (Oral)  Resp 18  SpO2 98% Physical Exam  Nursing note and vitals reviewed. Constitutional: He is oriented to person, place, and time. He appears well-developed and well-nourished.  HENT:  Head: Normocephalic and atraumatic.  Right Ear: External ear normal.  Left Ear: External ear normal.  Nose: Nose normal.  Mouth/Throat: Oropharynx is clear and moist.  Eyes: Conjunctivae and EOM are normal. Pupils are equal, round, and reactive to light.  Neck: Normal range of motion. Neck supple.  Cardiovascular: Normal rate, regular rhythm, normal heart sounds and intact distal pulses.   Pulmonary/Chest: Effort normal and breath sounds normal. No respiratory distress. He has no wheezes. He exhibits no tenderness.  Abdominal: Soft. Bowel sounds are normal. He exhibits no distension and no mass. There is tenderness. There is no guarding.    Musculoskeletal: Normal range of motion.  Neurological: He is alert and oriented to person, place, and time. He has normal reflexes. He exhibits normal muscle tone. Coordination normal.  Skin: Skin is warm and dry.  Psychiatric: He has a normal mood and affect. His behavior is normal. Judgment and thought content normal.    ED Course  Procedures (including critical care time) Labs Review Labs Reviewed  CBC WITH DIFFERENTIAL  COMPREHENSIVE METABOLIC PANEL  LIPASE, BLOOD  URINALYSIS, ROUTINE W REFLEX MICROSCOPIC    Imaging Review No results found.   EKG Interpretation   Date/Time:  Friday December 18 2013 08:41:58 EDT Ventricular Rate:  62 PR Interval:  115 QRS Duration: 88 QT Interval:  402 QTC Calculation: 408 R Axis:   79 Text Interpretation:  Normal sinus rhythm No significant change since last  tracing 13 February 2007 Confirmed by Nolton Denis MD, Duwayne Heck [phone]) on  12/18/2013 9:02:44 AM      MDM   Final diagnoses:  Generalized abdominal pain    33 y.o. Male with diffuse abdominal pain nausea vomiting and  diarrhea.  Patient with multiple recent clindamycin rounds for dental infection and percocet prescriptions.  Patient taking fluids well here without vomiting.  Labs normal except mildly elevated lipase.  Patient with ct without acute changes.  Discussed findings with patient and family.  Plan reglan, zofran and referral for follow up with gi.   The patient appears reasonably screened and/or stabilized for discharge and I doubt any other medical condition or other [facility] requiring further screening, evaluation, or treatment in the ED at this time prior to discharge.   Hilario Quarry, MD 12/18/13 1143  Hilario Quarry, MD 12/18/13 1146

## 2014-01-20 ENCOUNTER — Ambulatory Visit: Payer: Self-pay | Admitting: Internal Medicine

## 2014-06-04 ENCOUNTER — Ambulatory Visit (HOSPITAL_COMMUNITY): Admission: AD | Admit: 2014-06-04 | Payer: Self-pay | Source: Home / Self Care | Admitting: Psychiatry

## 2014-06-04 ENCOUNTER — Emergency Department (HOSPITAL_COMMUNITY)
Admission: EM | Admit: 2014-06-04 | Discharge: 2014-06-05 | Disposition: A | Discharge status: Home / Self Care | Payer: Self-pay | Attending: Emergency Medicine | Admitting: Emergency Medicine

## 2014-06-04 ENCOUNTER — Encounter (HOSPITAL_COMMUNITY): Payer: Self-pay | Admitting: Emergency Medicine

## 2014-06-04 DIAGNOSIS — F1310 Sedative, hypnotic or anxiolytic abuse, uncomplicated: Secondary | ICD-10-CM | POA: Insufficient documentation

## 2014-06-04 DIAGNOSIS — Z79899 Other long term (current) drug therapy: Secondary | ICD-10-CM | POA: Insufficient documentation

## 2014-06-04 DIAGNOSIS — Z8619 Personal history of other infectious and parasitic diseases: Secondary | ICD-10-CM | POA: Insufficient documentation

## 2014-06-04 DIAGNOSIS — M791 Myalgia: Secondary | ICD-10-CM | POA: Insufficient documentation

## 2014-06-04 DIAGNOSIS — F1110 Opioid abuse, uncomplicated: Secondary | ICD-10-CM | POA: Insufficient documentation

## 2014-06-04 DIAGNOSIS — Z8719 Personal history of other diseases of the digestive system: Secondary | ICD-10-CM | POA: Insufficient documentation

## 2014-06-04 DIAGNOSIS — J45909 Unspecified asthma, uncomplicated: Secondary | ICD-10-CM | POA: Insufficient documentation

## 2014-06-04 DIAGNOSIS — Z792 Long term (current) use of antibiotics: Secondary | ICD-10-CM | POA: Insufficient documentation

## 2014-06-04 DIAGNOSIS — Z87891 Personal history of nicotine dependence: Secondary | ICD-10-CM | POA: Insufficient documentation

## 2014-06-04 DIAGNOSIS — F1210 Cannabis abuse, uncomplicated: Secondary | ICD-10-CM | POA: Insufficient documentation

## 2014-06-04 DIAGNOSIS — F1510 Other stimulant abuse, uncomplicated: Secondary | ICD-10-CM | POA: Insufficient documentation

## 2014-06-04 DIAGNOSIS — F419 Anxiety disorder, unspecified: Secondary | ICD-10-CM | POA: Insufficient documentation

## 2014-06-04 NOTE — ED Notes (Addendum)
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 Last used earlier yesterday.

## 2014-06-05 LAB — RAPID URINE DRUG SCREEN, HOSP PERFORMED
Amphetamines: POSITIVE — AB
Barbiturates: NOT DETECTED
Benzodiazepines: POSITIVE — AB
Cocaine: NOT DETECTED
OPIATES: NOT DETECTED
Tetrahydrocannabinol: POSITIVE — AB

## 2014-06-05 LAB — CBC
HEMATOCRIT: 39.4 % (ref 39.0–52.0)
HEMOGLOBIN: 14 g/dL (ref 13.0–17.0)
MCH: 30.9 pg (ref 26.0–34.0)
MCHC: 35.5 g/dL (ref 30.0–36.0)
MCV: 87 fL (ref 78.0–100.0)
PLATELETS: 268 10*3/uL (ref 150–400)
RBC: 4.53 MIL/uL (ref 4.22–5.81)
RDW: 12.6 % (ref 11.5–15.5)
WBC: 10.5 10*3/uL (ref 4.0–10.5)

## 2014-06-05 LAB — COMPREHENSIVE METABOLIC PANEL
ALBUMIN: 4.7 g/dL (ref 3.5–5.2)
ALK PHOS: 56 U/L (ref 39–117)
ALT: 19 U/L (ref 0–53)
ANION GAP: 7 (ref 5–15)
AST: 20 U/L (ref 0–37)
BUN: 15 mg/dL (ref 6–23)
CHLORIDE: 101 mmol/L (ref 96–112)
CO2: 30 mmol/L (ref 19–32)
Calcium: 9.8 mg/dL (ref 8.4–10.5)
Creatinine, Ser: 0.64 mg/dL (ref 0.50–1.35)
GFR calc Af Amer: >90 mL/min (ref >90)
GFR calc non Af Amer: >90 mL/min (ref >90)
Glucose, Bld: 102 mg/dL — ABNORMAL HIGH (ref 70–99)
POTASSIUM: 3.9 mmol/L (ref 3.5–5.1)
Sodium: 138 mmol/L (ref 135–145)
Total Bilirubin: 0.3 mg/dL (ref 0.3–1.2)
Total Protein: 8.3 g/dL (ref 6.0–8.3)

## 2014-06-05 LAB — ETHANOL

## 2014-06-05 LAB — SALICYLATE LEVEL: Salicylate Lvl: <4 mg/dL (ref 2.8–20.0)

## 2014-06-05 LAB — ACETAMINOPHEN LEVEL

## 2014-06-05 MED ORDER — NICOTINE 21 MG/24HR TD PT24: 21.0000 mg | MEDICATED_PATCH | Freq: Every day | TRANSDERMAL | Status: DC

## 2014-06-05 MED ORDER — LORAZEPAM 2 MG/ML IJ SOLN
1.0000 mg | Freq: Once | INTRAMUSCULAR | Status: AC
  Administered 2014-06-05: 1 mg via INTRAVENOUS
  Filled 2014-06-05: qty 1

## 2014-06-05 MED ORDER — ZOLPIDEM TARTRATE 5 MG PO TABS: 5.0000 mg | ORAL_TABLET | Freq: Every evening | ORAL | Status: DC | PRN

## 2014-06-05 MED ORDER — IBUPROFEN 200 MG PO TABS
600.0000 mg | ORAL_TABLET | Freq: Once | ORAL | Status: AC
  Administered 2014-06-05: 600 mg via ORAL
  Filled 2014-06-05: qty 3

## 2014-06-05 MED ORDER — ONDANSETRON HCL 4 MG PO TABS: 4.0000 mg | ORAL_TABLET | Freq: Three times a day (TID) | ORAL | Status: DC | PRN

## 2014-06-05 MED ORDER — LORAZEPAM 1 MG PO TABS
0.0000 mg | ORAL_TABLET | Freq: Four times a day (QID) | ORAL | Status: DC
  Administered 2014-06-05: 1 mg via ORAL
  Filled 2014-06-05: qty 1

## 2014-06-05 MED ORDER — LORAZEPAM 1 MG PO TABS: 0.0000 mg | ORAL_TABLET | Freq: Two times a day (BID) | ORAL | Status: DC

## 2014-06-05 MED ORDER — ONDANSETRON 4 MG PO TBDP
4.0000 mg | ORAL_TABLET | Freq: Once | ORAL | Status: AC
  Administered 2014-06-05: 4 mg via ORAL
  Filled 2014-06-05: qty 1

## 2014-06-05 NOTE — ED Notes (Addendum)
Pt. Is accepted at [facility], tel. [phone]. Advised to be transported after 8am today, per Ala Dach of [facility], Dr. Read Drivers aware.

## 2014-06-05 NOTE — Discharge Instructions (Signed)
Polysubstance Abuse °When people abuse more than one drug or type of drug it is called polysubstance or polydrug abuse. For example, many smokers also drink alcohol. This is one form of polydrug abuse. Polydrug abuse also refers to the use of a drug to counteract an unpleasant effect produced by another drug. It may also be used to help with withdrawal from another drug. People who take stimulants may become agitated. Sometimes this agitation is countered with a tranquilizer. This helps protect against the unpleasant side effects. Polydrug abuse also refers to the use of different drugs at the same time.  °Anytime drug use is interfering with normal living activities, it has become abuse. This includes problems with family and friends. Psychological dependence has developed when your mind tells you that the drug is needed. This is usually followed by physical dependence which has developed when continuing increases of drug are required to get the same feeling or "high". This is known as addiction or chemical dependency. A person's risk is much higher if there is a history of chemical dependency in the family. °SIGNS OF CHEMICAL DEPENDENCY °· You have been told by friends or family that drugs have become a problem. °· You fight when using drugs. °· You are having blackouts (not remembering what you do while using). °· You feel sick from using drugs but continue using. °· You lie about use or amounts of drugs (chemicals) used. °· You need chemicals to get you going. °· You are suffering in work performance or in school because of drug use. °· You get sick from use of drugs but continue to use anyway. °· You need drugs to relate to people or feel comfortable in social situations. °· You use drugs to forget problems. °"Yes" answered to any of the above signs of chemical dependency indicates there are problems. The longer the use of drugs continues, the greater the problems will become. °If there is a family history of  drug or alcohol use, it is best not to experiment with these drugs. Continual use leads to tolerance. After tolerance develops more of the drug is needed to get the same feeling. This is followed by addiction. With addiction, drugs become the most important part of life. It becomes more important to take drugs than participate in the other usual activities of life. This includes relating to friends and family. Addiction is followed by dependency. Dependency is a condition where drugs are now needed not just to get high, but to feel normal. °Addiction cannot be cured but it can be stopped. This often requires outside help and the care of professionals. Treatment centers are listed in the yellow pages under: Cocaine, Narcotics, and Alcoholics Anonymous. Most hospitals and clinics can refer you to a specialized care center. Talk to your caregiver if you need help. °Document Released: 11/01/2004 Document Revised: 06/04/2011 Document Reviewed: 03/12/2005 °ExitCare® Patient Information ©2015 [employer]. This information is not intended to replace advice given to you by your health care provider. Make sure you discuss any questions you have with your health care provider. ° ° ° °Emergency Department Resource Guide °1) Find a Doctor and Pay Out of Pocket °Although you won't have to find out who is covered by your insurance plan, it is a good idea to ask around and get recommendations. You will then need to call the office and see if the doctor you have chosen will accept you as a new patient and what types of options they offer for patients who   are self-pay. Some doctors offer discounts or will set up payment plans for their patients who do not have insurance, but you will need to ask so you aren't surprised when you get to your appointment. ° °2) Contact Your Local Health Department °Not all health departments have doctors that can see patients for sick visits, but many do, so it is worth a call to see if yours does. If  you don't know where your local health department is, you can check in your phone book. The CDC also has a tool to help you locate your state's health department, and many state websites also have listings of all of their local health departments. ° °3) Find a Walk-in Clinic °If your illness is not likely to be very severe or complicated, you may want to try a walk in clinic. These are popping up all over the country in pharmacies, drugstores, and shopping centers. They're usually staffed by nurse practitioners or physician assistants that have been trained to treat common illnesses and complaints. They're usually fairly quick and inexpensive. However, if you have serious medical issues or chronic medical problems, these are probably not your best option. ° °No Primary Care Doctor: °- Call [facility] at  832-8000 - they can help you locate a primary care doctor that  accepts your insurance, provides certain services, etc. °- Physician Referral Service- 1-800-533-3463 ° °Chronic Pain Problems: °Organization         Address  Phone   Notes  °[facility]  (336) 297-2271 Patients need to be referred by their primary care doctor.  ° °Medication Assistance: °Organization         Address  Phone   Notes  °[place] [facility] [address] °[place], [place] [address] (336) 641-8030 --Must be a resident of [place] °-- Must have NO insurance coverage whatsoever (no Medicaid/ Medicare, etc.) °-- The pt. MUST have a primary care doctor that directs their care regularly and follows them in the community °  °[facility]  (866) 331-1348   °[employer]  (888) 892-1162   ° °Agencies that provide inexpensive medical care: °Organization         Address  Phone   Notes  °[facility]  (336) 832-8035   °[facility]    (336) 832-7272   °[facility] [address] °[place], [place] [address] (336) 832-4777   °[facility] [place] [address], °[place] (336) 271-4999   °[facility]    (336) 373-0678   °[facility]    (336) 272-1050   °[facility] ° [address], [place] Phone:  (336) 832-4444, Fax:  (336) 832-4440 Hours of Operation:  9 am - 6 pm, M-F.  Also accepts Medicaid/Medicare and self-pay.  °[facility] ° [address], [place] Phone: (336) 832-3150, Fax: (336) 832-3151. Hours of Operation:  8:30 am - 5:30 pm, M-F.  Also accepts Medicaid and self-pay.  °[facility] [place] [address], [place] Phone: (336) 878-6027   °[facility] [address], [place], [place] (336)723-1848, Ext. 123 Mondays & Thursdays: 7-9 AM.  First 15 patients are seen on a first come, first serve basis. °  ° °Medicaid-accepting [place] Providers: ° °Organization         Address  Phone   Notes  °[facility] [address], [place] (336) 641-2100 Also accepts self-pay patients.  °[facility] [address], [place] ° (336) 856-9996   °[facility] [address], [place] (336) 288-8857   °[facility] [address] [place] [address], [place] (336) 299-7000   °Veita Bland [address], [place]  ° (336) 373-1557 Only accepts [place] Access Medicaid patients after they have their name applied to their card.  ° °Self-Pay (no insurance) in [place]: ° °Organization         Address  Phone   Notes  °Sickle Cell Patients, [facility] [address], [place] (336) 832-1970   °[facility] [address], [place] (336) 832-4400   °[facility] ° [address], [place] (336) 992-4800   °[facility]/Dr. Osei-Bonsu ° [address] [place] [address], [place] or [address], [place] (336) 841-8500  Phone number for both [place] and [place] locations is the same.  °[facility] [address], [place] (336) 299-0000   °[facility] [address] [place] [address], [place] or [address] (336) 852-7530 °(336) 878-2260   °[facility] [address], [place] (336) 350-1642, phone; (336) 294-5005, fax Sees patients 1st and 3rd Saturday of every month.  Must not qualify for public or private insurance (i.e. Medicaid, Medicare, [place] Health Choice, Veterans' Benefits) • Household income should be no more than 200% of the poverty level •The clinic cannot treat you if you are pregnant or think you are pregnant • Sexually transmitted diseases are not treated at the clinic.  ° ° °Dental Care: °Organization         Address  Phone  Notes  °[place] [facility] [address], [place] (336) 641-6152 Accepts children up to age 21 who are enrolled in Medicaid or [place] Health Choice; pregnant women with a Medicaid card; and children who have applied for Medicaid or [place] Health Choice, but were declined, whose parents can pay a reduced fee at time of service.  °[facility] [place]  [address], [place] (336) 641-7733 Accepts children up to age 21 who are enrolled in Medicaid or [place] Health Choice; pregnant women with a Medicaid card; and children who have applied for Medicaid or [place] Health Choice, but were declined, whose parents can pay a reduced fee at time of service.  °[facility] ° [address],  (336) 641-4533 Patients are seen by appointment only. Walk-ins are not accepted. [facility] will see patients 18 years of age and older. °Monday - Tuesday (8am-5pm) °Most Wednesdays (8:30-5pm) °$30 per visit, cash only  °[facility] ° [address], [place] (336) 641-4533 Patients are seen by appointment  only. Walk-ins are not accepted. [facility] will see patients 18 years of age and older. °One Wednesday Evening (Monthly: Volunteer Based).  $30 per visit, cash only  °[facility]  (919) 537-3737 for adults; Children under age 4, call Graduate Pediatric Dentistry at (919) [phone]. Children aged 4-14, please call (919) [phone] to request a pediatric application. ° Dental services are provided in all areas of dental care including fillings, crowns and bridges, complete and partial dentures, implants, gum treatment, root canals,   and extractions. Preventive care is also provided. Treatment is provided to both adults and children. °Patients are selected via a lottery and there is often a waiting list. °  °[facility] [address], °[place] ° (336) 763-8833 [email or web address] °  °[facility] [address], [place], [place] (336)723-1848, Ext. 123 Second and Fourth Thursday of each month, opens at 6:30 AM; Clinic ends at 9 AM.  Patients are seen on a first-come first-served basis, and a limited number are seen during each clinic.  ° °[facility] ° [address], [place], [place] (336) 723-7904   Eligibility Requirements °You must have lived in [place], [place], or [place] counties for at least the last three months. °  You cannot be eligible for state or federal sponsored healthcare insurance, including Veterans Administration, Medicaid, or Medicare. °  You generally cannot be eligible for healthcare insurance through your employer.  °  How to apply: °Eligibility screenings are held every Tuesday and Wednesday afternoon from 1:00 pm until 4:00 pm. You do not need an appointment for the interview!  °[facility] [address], [place], [place] 336-631-2330   °[facility] [facility]  336-342-8273   °[place]  336-703-3100   ° [facility]  336-570-6415   ° °Behavioral Health  Resources in the Community: °Intensive Outpatient Programs °Organization         Address  Phone  Notes  °[facility] [address], [place], [place] 336-878-6098   °[facility] [address], [place], [place] 336-832-9800   °[facility] [address], [place], [place] ° 336-882-2125   °[facility] [address],  °[place], [place] 1-800-853-5163 or 336-641-4981   °Substance Abuse Resources °Organization         Address  Phone  Notes  °[facility]  336-882-2125   °[facility]  336-784-9470   °[facility]  336-285-9073   °[facility]  336-845-3988   °Residential & Outpatient Substance Abuse Program  1-800-659-3381   °Psychological Services °Organization         Address  Phone  Notes  °[facility]  336- 832-9600   °[facility]  336- 378-7881   °[facility] [address], [place] 1-800-853-5163 or 336-641-4981   ° °Mobile Crisis Teams °Organization         Address  Phone  Notes  °[facility], Mobile Crisis Care Unit  1-877-626-1772   °Assertive °[facility] ° [address] [place], [place] 336-834-9664   °Sharon DeEsch [address] °[place] 336-554-5454   ° °Self-Help/Support Groups °Organization         Address  Phone             Notes  °[facility] [place] - variety of support groups  336- 373-1402 Call for more information  °Narcotics Anonymous (NA), [facility] [address], °[place]  2 meetings at this location  ° °Residential Treatment Programs °Organization         Address  Phone  Notes  °[facility] [address],    °[place]   1-866-801-8205   °[facility] ° [address], [place],  704-293-8524   °[facility] [address], [place] 336-845-3988 Admissions: 8am-3pm M-F  °[facility] [address]    °[place], [place] 336-841-1104   °[facility] [address], [place], [place] 336-379-7146   °[facility] [address]  °[place], [place] 336-285-9073   °[facility] Intensive Outpatient [address], [place], [place] 336-852-3033   °[facility] [address]  °[place], [place] 1-877-615-2722 or 336-784-9470   °[facility] ([facility] [address] [place], [place] 336-227-7417 Accepts Medicaid  °[facility] [address]  °[place] 1-800-659-3381 Substance Abuse/Addiction Treatment  ° °[place] Behavioral Health Resources °Organization         Address  Phone  Notes  °[facility]  (888) 581-9988   °Julie Brannon, PhD [address] [place], [place]   (336) 349-5553 or (336) 951-0000   °[facility]   [address] °[place], [place] (336) 349-4454   °[facility] [address], [place], [place] (336) 342-8316 Insurance/Medicaid/sponsorship through [facility]  °[facility] [address]                                    [place], [place] (336) 342-8316 Therapy/tele-psych/case  °[facility] [address]  ° [place], [place] (336) 349-2233    °Dr. Arfeen  (336) 349-4544   °[facility] [place]  [employer] [place] [facility] 1) [address], [place] °2) [address], [place] °3)  [address], [place] (336) 349-3220 °(336) 342-7768 ° °(336) 342-8140   °[place] [facility] (336) 342-1394 or (336) 342-3537 (After Hours)    ° ° ° °

## 2014-06-05 NOTE — BH Assessment (Signed)
Received call from [name] at [facility] who said Pt has been accepted. He can be transported by [facility] after 0800. Please call RN report when Pt is ready to leave at [phone]. Notified Dr. Read Drivers and Merlene Morse of acceptance.  Harlin Rain Ria Comment, [facility] Triage Specialist [phone]

## 2014-06-05 NOTE — BHH Counselor (Signed)
Contacted the following facilities for placement:  [facility]- per [place], bed available. Faxed clinical information [facility]- per Misty [place], check back later today for bed availability   Harlin Rain Ria Comment, [facility] Triage Specialist [phone]

## 2014-06-05 NOTE — ED Notes (Addendum)
Pt. In burgundy scrubs. Pt. Has 2 belongings bags. Pt. Has gray nike air snickers, 2 pairs white socks, 1 blue jean, 1 brown belt, 1 wallet(no money, platinum debit card, ID card, 1 beige long johns, 1 key chain, 1 white t-shirt. No cell phone. Pt. Belongings locked up at the nurses station in the yellow zone.

## 2014-06-05 NOTE — ED Provider Notes (Signed)
CSN: [id number]     Arrival date & time 06/04/14  2239 History   First MD Initiated Contact with Patient 06/05/14 [phone]     Chief Complaint  Patient presents with  . detox     (Consider location/radiation/quality/duration/timing/severity/associated sxs/prior Treatment) HPI Comments: Patient is a 34 year old male with a history of gastric ulcer and hepatitis C who presents to the emergency department for psychiatric evaluation for detox. Patient states that he was arrested today on drug charges for paraphernalia. He was also questioned by police about some inappropriate text messages to the 82 year old daughter of his wife which patient states that he never sent. Patient states that his arrest was a wake up call for him needing to get help. He states he has never sought help for his drug abuse. He has been using Suboxone on and off for 3 years. He also reports use of IV heroin. Patient is a frequent Xanax user and reports taking 5 2mg  bars of Xanax per day. He had 2 2mg  bars of Xanax earlier yesterday. He denies any SI/HI. He does have a history of accidental overdose on heroin. He denies ever using amphetamines.  The history is provided by the patient. No language interpreter was used.    Past Medical History  Diagnosis Date  . Asthma   . Hepatitis C   . Gastric ulcer    History reviewed. No pertinent past surgical history. History reviewed. No pertinent family history. History  Substance Use Topics  . Smoking status: Former Smoker    Types: Cigarettes  . Smokeless tobacco: Not on file  . Alcohol Use: No    Review of Systems  Musculoskeletal: Positive for myalgias.  Psychiatric/Behavioral: Positive for behavioral problems. The patient is nervous/anxious.   All other systems reviewed and are negative.   Allergies  Tylenol  Home Medications   Prior to Admission medications   Medication Sig Start Date End Date Taking? Authorizing Provider  ALPRAZolam (XANAX XR) 2 MG 24 hr  tablet Take 2 mg by mouth 2 (two) times daily.   Yes Historical Provider, MD  buprenorphine-naloxone (SUBOXONE) 2-0.5 MG SUBL SL tablet Place 1 tablet under the tongue daily.   Yes Historical Provider, MD  clindamycin (CLEOCIN) 150 MG capsule Take 300 mg by mouth 3 (three) times daily.    Historical Provider, MD  ibuprofen (ADVIL,MOTRIN) 800 MG tablet Take 1 tablet (800 mg total) by mouth 3 (three) times daily. 12/12/13   Ladona Mow, PA-C  methocarbamol (ROBAXIN) 500 MG tablet Take 1 tablet (500 mg total) by mouth 2 (two) times daily. 12/12/13   Ladona Mow, PA-C  metoCLOPramide (REGLAN) 10 MG tablet Take 1 tablet (10 mg total) by mouth every 6 (six) hours. Patient not taking: Reported on 06/05/2014 12/18/13   Margarita Grizzle, MD  ondansetron (ZOFRAN ODT) 8 MG disintegrating tablet Take 1 tablet (8 mg total) by mouth every 8 (eight) hours as needed for nausea or vomiting. Patient not taking: Reported on 06/05/2014 12/18/13   Margarita Grizzle, MD  oxyCODONE (OXY IR/ROXICODONE) 5 MG immediate release tablet Take 5 mg by mouth every 6 (six) hours as needed for severe pain.    Historical Provider, MD   BP 146/93 mmHg  Pulse 54  Temp(Src) 97.7 F (36.5 C) (Oral)  Resp 14  SpO2 100%   Physical Exam  Constitutional: He is oriented to person, place, and time. He appears well-developed and well-nourished. No distress.  Nontoxic/nonseptic appearing  HENT:  Head: Normocephalic and atraumatic.  Eyes: Conjunctivae and  EOM are normal. No scleral icterus.  Pupils equal round and reactive to direct and consensual light; approximately 4mm in diameter  Neck: Normal range of motion.  Cardiovascular: Normal rate, regular rhythm and intact distal pulses.   Pulmonary/Chest: Effort normal and breath sounds normal. No respiratory distress. He has no wheezes. He has no rales.  Respirations even and unlabored  Musculoskeletal: Normal range of motion.  Neurological: He is alert and oriented to person, place, and time. No cranial  nerve deficit. He exhibits normal muscle tone. Coordination normal.  GCS 15. Speech is goal oriented. No focal neurologic deficits appreciated.  Skin: Skin is warm and dry. No rash noted. He is not diaphoretic. No erythema. No pallor.  Psychiatric: His mood appears anxious. His speech is rapid and/or pressured. He is agitated. Cognition and memory are normal. He expresses no homicidal and no suicidal ideation. He expresses no suicidal plans and no homicidal plans.  Patient tearful  Nursing note and vitals reviewed.   ED Course  Procedures (including critical care time) Labs Review Labs Reviewed  ACETAMINOPHEN LEVEL - Abnormal; Notable for the following:    Acetaminophen (Tylenol), Serum <10.0 (*)    All other components within normal limits  COMPREHENSIVE METABOLIC PANEL - Abnormal; Notable for the following:    Glucose, Bld 102 (*)    All other components within normal limits  URINE RAPID DRUG SCREEN (HOSP PERFORMED) - Abnormal; Notable for the following:    Benzodiazepines POSITIVE (*)    Amphetamines POSITIVE (*)    Tetrahydrocannabinol POSITIVE (*)    All other components within normal limits  CBC  ETHANOL  SALICYLATE LEVEL    Imaging Review No results found.   EKG Interpretation None      MDM   Final diagnoses:  Opioid abuse  Benzodiazepine abuse    34 year old male presents to the emergency department for psychiatric evaluation. Patient requesting detox from Xanax and Suboxone. He also has a history of IV heroin use. Patient denies use of amphetamines despite his positive amphetamine findings on UDS. Patient was hydrated in the ED with fluids after becoming pale with dilated pupils in the waiting room. Patient has a normal physical examination on my encounter. He has been hemodynamically stable over ED course. Patient placed on CIWA protocol. No seizure activity observed. Patient has been medically cleared. [facility] and inpatient treatment with  transfer after 8 AM.    Filed Vitals:   06/05/14 0300 06/05/14 0358 06/05/14 0400 06/05/14 0500  BP: 110/93 133/85 141/89 146/93  Pulse: 62 66 63 54  Temp:      TempSrc:      Resp: SpO2:  100%         Antony Madura, PA-C 06/05/14 0521  Paula Libra, MD 06/05/14 [phone]
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
## 2014-06-05 NOTE — ED Notes (Signed)
Pt became pale and pupils dilated while in waiting room.

## 2014-06-05 NOTE — ED Notes (Signed)
TelePsych on going.

## 2014-06-05 NOTE — BH Assessment (Signed)
Received notification of TTS consult request. Spoke with Antony Madura, PA-C who said Pt has a history of polysubstance abuse and is abusing Xanax. He was arrested recently on a drug paraphernalia charge. Tele-assessment will be initiated.  Harlin Rain Ria Comment, [facility] Triage Specialist [phone]

## 2015-05-28 ENCOUNTER — Emergency Department (HOSPITAL_COMMUNITY)
Admission: EM | Admit: 2015-05-28 | Discharge: 2015-05-28 | Disposition: A | Discharge status: Home / Self Care | Payer: No Typology Code available for payment source | Attending: Emergency Medicine | Admitting: Emergency Medicine

## 2015-05-28 ENCOUNTER — Encounter (HOSPITAL_COMMUNITY): Payer: Self-pay | Admitting: *Deleted

## 2015-05-28 DIAGNOSIS — T402X1A Poisoning by other opioids, accidental (unintentional), initial encounter: Secondary | ICD-10-CM | POA: Insufficient documentation

## 2015-05-28 DIAGNOSIS — Z5181 Encounter for therapeutic drug level monitoring: Secondary | ICD-10-CM

## 2015-05-28 DIAGNOSIS — Z87891 Personal history of nicotine dependence: Secondary | ICD-10-CM | POA: Insufficient documentation

## 2015-05-28 DIAGNOSIS — T40601A Poisoning by unspecified narcotics, accidental (unintentional), initial encounter: Secondary | ICD-10-CM

## 2015-05-28 DIAGNOSIS — Z79899 Other long term (current) drug therapy: Secondary | ICD-10-CM | POA: Insufficient documentation

## 2015-05-28 DIAGNOSIS — Y998 Other external cause status: Secondary | ICD-10-CM | POA: Insufficient documentation

## 2015-05-28 DIAGNOSIS — J45909 Unspecified asthma, uncomplicated: Secondary | ICD-10-CM | POA: Insufficient documentation

## 2015-05-28 DIAGNOSIS — Y9389 Activity, other specified: Secondary | ICD-10-CM | POA: Insufficient documentation

## 2015-05-28 DIAGNOSIS — R7889 Finding of other specified substances, not normally found in blood: Secondary | ICD-10-CM | POA: Insufficient documentation

## 2015-05-28 DIAGNOSIS — Y9289 Other specified places as the place of occurrence of the external cause: Secondary | ICD-10-CM | POA: Insufficient documentation

## 2015-05-28 DIAGNOSIS — Z8619 Personal history of other infectious and parasitic diseases: Secondary | ICD-10-CM | POA: Insufficient documentation

## 2015-05-28 DIAGNOSIS — Z8719 Personal history of other diseases of the digestive system: Secondary | ICD-10-CM | POA: Insufficient documentation

## 2015-05-28 MED ORDER — NALOXONE HCL 0.4 MG/ML IJ SOLN: 0.4000 mg | INTRAMUSCULAR | Status: DC | PRN

## 2015-05-28 MED ORDER — NALOXONE HCL 2 MG/2ML IJ SOSY
2.0000 mg | PREFILLED_SYRINGE | Freq: Once | INTRAMUSCULAR | Status: AC
  Administered 2015-05-28: 2 mg via INTRAVENOUS
  Filled 2015-05-28: qty 2

## 2015-05-28 MED ORDER — IBUPROFEN 800 MG PO TABS
800.0000 mg | ORAL_TABLET | Freq: Once | ORAL | Status: AC
  Administered 2015-05-28: 800 mg via ORAL
  Filled 2015-05-28: qty 1

## 2015-05-28 NOTE — ED Notes (Signed)
Pt recently released from prison.  Pt staying with mom.  Pt took took 2  of suboxine today but was supposed to take much less.  Pt reports took too much and didn't fully read the directions.  Pt denies SI or HI.  Pt states it was a mistake.  Pt reports just having gotten the new medication yesterday.  Pt alert but very groggy.  Periods of apnea.

## 2015-05-28 NOTE — ED Provider Notes (Signed)
CSN: [id number]     Arrival date & time 05/28/15  1826 History   First MD Initiated Contact with Patient 05/28/15 1842     Chief Complaint  Patient presents with  . Drug Overdose    accidental     (Consider location/radiation/quality/duration/timing/severity/associated sxs/prior Treatment) Patient is a 35 y.o. male presenting with Overdose. The history is provided by the patient.  Drug Overdose This is a new problem. The current episode started 12 to 24 hours ago. The problem occurs constantly. The problem has not changed since onset.Pertinent negatives include no chest pain, no abdominal pain and no shortness of breath. Nothing aggravates the symptoms. Nothing relieves the symptoms. He has tried nothing for the symptoms.    Past Medical History  Diagnosis Date  . Asthma   . Hepatitis C   . Gastric ulcer    History reviewed. No pertinent past surgical history. No family history on file. Social History  Substance Use Topics  . Smoking status: Former Smoker    Types: Cigarettes  . Smokeless tobacco: None  . Alcohol Use: No    Review of Systems  Respiratory: Negative for shortness of breath.   Cardiovascular: Negative for chest pain.  Gastrointestinal: Negative for abdominal pain.  All other systems reviewed and are negative.     Allergies  Tylenol  Home Medications   Prior to Admission medications   Medication Sig Start Date End Date Taking? Authorizing Provider  buprenorphine-naloxone (SUBOXONE) 8-2 MG SUBL SL tablet Place 0.5 tablets under the tongue 3 (three) times daily.   Yes Historical Provider, MD  Multiple Vitamin (MULTIVITAMIN WITH MINERALS) TABS tablet Take 1 tablet by mouth daily.   Yes Historical Provider, MD  Omega-3 Fatty Acids (OMEGA 3 PO) Take 1 capsule by mouth daily.   Yes Historical Provider, MD  QUEtiapine (SEROQUEL) 50 MG tablet Take 50 mg by mouth at bedtime.   Yes Historical Provider, MD  venlafaxine XR (EFFEXOR-XR) 37.5 MG 24 hr capsule Take  37.5 mg by mouth daily with breakfast.   Yes Historical Provider, MD  metoCLOPramide (REGLAN) 10 MG tablet Take 1 tablet (10 mg total) by mouth every 6 (six) hours. Patient not taking: Reported on 06/05/2014 12/18/13   Margarita Grizzle, MD  ondansetron (ZOFRAN ODT) 8 MG disintegrating tablet Take 1 tablet (8 mg total) by mouth every 8 (eight) hours as needed for nausea or vomiting. Patient not taking: Reported on 06/05/2014 12/18/13   Margarita Grizzle, MD   BP 128/76 mmHg  Pulse 92  Temp(Src) 98.2 F (36.8 C) (Oral)  Resp 18  Ht  (1.854 m)  Wt 240 lb (108.863 kg)  BMI 31.67 kg/m2  SpO2 100% Physical Exam  Constitutional: He is oriented to person, place, and time. He appears well-developed and well-nourished. No distress.  HENT:  Head: Normocephalic and atraumatic.  Eyes: Conjunctivae are normal.  Neck: Neck supple. No tracheal deviation present.  Cardiovascular: Normal rate, regular rhythm and normal heart sounds.   No murmur heard. Pulmonary/Chest: Effort normal and breath sounds normal. No respiratory distress. He has no wheezes. He has no rales.  Abdominal: Soft. He exhibits no distension.  Neurological: He is alert and oriented to person, place, and time. GCS eye subscore is 3. GCS verbal subscore is 5. GCS motor subscore is 6.  Skin: Skin is warm and dry.  Psychiatric: His affect is blunt. His speech is slurred. He is slowed and withdrawn.  Appears sleepy    ED Course  Procedures (including critical care time)  Labs Review Labs Reviewed - No data to display  Imaging Review No results found. I have personally reviewed and evaluated these images and lab results as part of my medical decision-making.   EKG Interpretation None      MDM   Final diagnoses:  Encounter for monitoring Suboxone maintenance therapy  Opiate overdose, accidental or unintentional, initial encounter    35 y.o. male presents with Increased sleepiness and some mildly depressed breathing from home  after starting Suboxone therapy today. He had recently been incarcerated and had been clean but has been using heroin for 16 years and was going to use yesterday so he got established at a drug abuse clinic. He was instructed to take 1.5 tablets but instead took 2 today. He was due to go up to 3 tablets tomorrow. In the afternoon his mother noticed that he was breathing shallow, falling asleep very easily and difficulty with speech occurred. EMS saw him and gave him 2 mg of Narcan intranasally with minimal response, he was brought here was given 2 doses of 2 mg IV Narcan and after a period of observation he maintained his airway appropriately, had no further slurring of speech, and was off of oxygen completely without desaturation. He was instructed to stop Suboxone completely and avoid other sedating medications until able to follow-up with his primary prescriber for medication reconciliation.    Lyndal Pulley, MD 05/28/15 2142

## 2015-05-28 NOTE — Discharge Instructions (Signed)
Drug Overdose °Drug overdose happens when you take too much of a drug. An overdose can occur with illegal drugs, prescription drugs, or over-the-counter (OTC) drugs. °The effects of drug overdose can be mild, dangerous, or even deadly. °CAUSES °Drug overdose may be caused by: °· Taking too much of a drug on purpose. °· Taking too much of a drug by accident. °· An error made by a health care provider who prescribes a drug. °· An error made by a pharmacist who fills the prescription order. °Drugs that commonly cause overdose include: °· Mental health drugs. °· Pain medicines. °· Illegal drugs. °· OTC cough and cold medicines. °· Heart medicines. °· Seizure medicines. °RISK FACTORS °Drug overdose is more likely in: °· Children. They may be attracted to colorful pills. Because of children's small size, even a small amount of a drug can be dangerous. °· Elderly people. They may be taking many different drugs. Elderly people may have difficulty reading labels or remembering when they last took their medicine. °The risk of drug overdose is also higher for someone who: °· Takes illegal drugs. °· Takes a drug and drinks alcohol. °· Has a mental health condition. °SYMPTOMS °Signs and symptoms of drug overdose depend on the drug and the amount that was taken. Common danger signs include: °· Behavior changes. °· Sleepiness. °· Slowed breathing. °· Nausea and vomiting. °· Seizures. °· Changes in eye pupil size (very large or very small). °If there are signs of very low blood pressure from a drug overdose (shock), emergency treatment is required. These signs include: °· Cold and clammy skin. °· Pale skin. °· Blue lips. °· Very slow breathing. °· Extreme sleepiness. °· Loss of consciousness. °DIAGNOSIS °Drug overdose may be diagnosed based on your symptoms. It is important that you tell your health care provider: °· All of the drugs that you have taken. °· When you took the drugs. °· Whether you were drinking alcohol. °Your health  care provider will do a physical exam. This exam may include: °· Checking and monitoring your heart rate and rhythm, your temperature, and your blood pressure (vital signs). °· Checking your breathing and oxygen level. °You may also have tests, including:  °· Urine tests to check for drugs in your system. °· Blood tests to check for: °¨ Drugs in your system. °¨ Signs of an imbalance of your blood minerals (electrolytes). °¨ Liver damage. °¨ Kidney damage. °TREATMENT °Supporting your vital signs and your breathing is the first step in treating a drug overdose. Treatment may also include: °· Receiving fluids and electrolytes through an IV tube. °· Having a breathing tube (endotracheal tube) inserted in your airway to help you breathe. °· Having a tube passed through your nose and into your stomach (nasogastric tube) to wash out your stomach. °· Medicines. You may get medicines to: °¨ Make you vomit. °¨ Absorb any medicine that is left in your digestive system (activated charcoal). °¨ Block or reverse the effect of the drug that caused the overdose. °· Having your blood filtered through an artificial kidney machine (hemodialysis). You may need this if your overdose is severe or if you have kidney failure. °· Having ongoing counseling and mental health support if you intentionally overdosed or used an illegal drug. °HOME CARE INSTRUCTIONS °· Take medicines only as directed by your health care provider. Always ask your health care provider to discuss the possible side effects of any new drug that you start taking. °· Keep a list of all of the drugs   that you take, including over-the-counter medicines. Bring this list with you to all of your medical visits.  Read the drug inserts that come with your medicines.  Do not use illegal drugs.  Do not drink alcohol when taking drugs.  Store all medicines in safety containers that are out of the reach of children.  Keep the phone number of your local poison control  center near your phone or on your cell phone.  Get help if you are struggling with alcohol or drug use.  Get help if you are struggling with depression or another mental health problem.  Keep all follow-up visits as directed by your health care provider. This is important. SEEK MEDICAL CARE IF:  Your symptoms return.  You develop any new signs or symptoms when you are taking medicines. SEEK IMMEDIATE MEDICAL CARE IF:  You think that you or someone else may have taken too much of a drug. The hotline of the [facility] is [phone].  You or someone else is having symptoms of a drug overdose.  You have serious thoughts about hurting yourself or others.  You have chest pain.  You have difficulty breathing.  You have a loss of consciousness. Drug overdose is an emergency. Do not wait to see if the symptoms will go away. Get medical help right away. Call your local emergency services (911 in the [place] Do not drive yourself to the hospital.   This information is not intended to replace advice given to you by your health care provider. Make sure you discuss any questions you have with your health care provider.   Document Released: 07/27/2014 Document Reviewed: 07/27/2014 Elsevier Interactive Patient Education 2016 [employer].  Accidental Overdose A drug overdose occurs when a chemical substance (drug or medication) is used in amounts large enough to overcome a person. This may result in severe illness or death. This is a type of poisoning. Accidental overdoses of medications or other substances come from a variety of reasons. When this happens accidentally, it is often because the person taking the substance does not know enough about what they have taken. Drugs which commonly cause overdose deaths are alcohol, psychotropic medications (medications which affect the mind), pain medications, illegal drugs (street drugs) such as cocaine and heroin, and multiple  drugs taken at the same time. It may result from careless behavior (such as over-indulging at a party). Other causes of overdose may include multiple drug use, a lapse in memory, or drug use after a period of no drug use.  Sometimes overdosing occurs because a person cannot remember if they have taken their medication.  A common unintentional overdose in young children involves multi-vitamins containing iron. Iron is a part of the hemoglobin molecule in blood. It is used to transport oxygen to living cells. When taken in small amounts, iron allows the body to restock hemoglobin. In large amounts, it causes problems in the body. If this overdose is not treated, it can lead to death. Never take medicines that show signs of tampering or do not seem quite right. Never take medicines in the dark or in poor lighting. Read the label and check each dose of medicine before you take it. When adults are poisoned, it happens most often through carelessness or lack of information. Taking medicines in the dark or taking medicine prescribed for someone else to treat the same type of problem is a dangerous practice. SYMPTOMS  Symptoms of overdose depend on the medication and amount taken. They  can vary from over-activity with stimulant over-dosage, to sleepiness from depressants such as alcohol, narcotics and tranquilizers. Confusion, dizziness, nausea and vomiting may be present. If problems are severe enough coma and death may result. DIAGNOSIS  Diagnosis and management are generally straightforward if the drug is known. Otherwise it is more difficult. At times, certain symptoms and signs exhibited by the patient, or blood tests, can reveal the drug in question.  TREATMENT  In an emergency department, most patients can be treated with supportive measures. Antidotes may be available if there has been an overdose of opioids or benzodiazepines. A rapid improvement will often occur if this is the cause of overdose. At home  or away from medical care:  There may be no immediate problems or warning signs in children.  Not everything works well in all cases of poisoning.  Take immediate action. Poisons may act quickly.  If you think someone has swallowed medicine or a household product, and the person is unconscious, having seizures (convulsions), or is not breathing, immediately call for an ambulance. IF a person is conscious and appears to be doing OK but has swallowed a poison:  Do not wait to see what effect the poison will have. Immediately call a poison control center (listed in the white pages of your telephone book under "Poison Control" or inside the front cover with other emergency numbers). Some poison control centers have TTY capability for the deaf. Check with your local center if you or someone in your family requires this service.  Keep the container so you can read the label on the product for ingredients.  Describe what, when, and how much was taken and the age and condition of the person poisoned. Inform them if the person is vomiting, choking, drowsy, shows a change in color or temperature of skin, is conscious or unconscious, or is convulsing.  Do not cause vomiting unless instructed by medical personnel. Do not induce vomiting or force liquids into a person who is convulsing, unconscious, or very drowsy. Stay calm and in control.   Activated charcoal also is sometimes used in certain types of poisoning and you may wish to add a supply to your emergency medicines. It is available without a prescription. Call a poison control center before using this medication. PREVENTION  Thousands of children die every year from unintentional poisoning. This may be from household chemicals, poisoning from carbon monoxide in a car, taking their parent's medications, or simply taking a few iron pills or vitamins with iron. Poisoning comes from unexpected sources.  Store medicines out of the sight and reach of  children, preferably in a locked cabinet. Do not keep medications in a food cabinet. Always store your medicines in a secure place. Get rid of expired medications.  If you have children living with you or have them as occasional guests, you should have child-resistant caps on your medicine containers. Keep everything out of reach. Child proof your home.  If you are called to the telephone or to answer the door while you are taking a medicine, take the container with you or put the medicine out of the reach of small children.  Do not take your medication in front of children. Do not tell your child how good a medication is and how good it is for them. They may get the idea it is more of a treat.  If you are an adult and have accidentally taken an overdose, you need to consider how this happened and what can  be done to prevent it from happening again. If this was from a street drug or alcohol, determine if there is a problem that needs addressing. If you are not sure a problems exists, it is easy to talk to a professional and ask them if they think you have a problem. It is better to handle this problem in this way before it happens again and has a much worse consequence.   This information is not intended to replace advice given to you by your health care provider. Make sure you discuss any questions you have with your health care provider.   Document Released: 05/26/2004 Document Revised: 04/02/2014 Document Reviewed: 08/30/2014 Elsevier Interactive Patient Education [employer]

## 2015-06-26 ENCOUNTER — Emergency Department (HOSPITAL_COMMUNITY)
Admission: EM | Admit: 2015-06-26 | Discharge: 2015-06-26 | Disposition: A | Discharge status: Home / Self Care | Payer: No Typology Code available for payment source | Attending: Emergency Medicine | Admitting: Emergency Medicine

## 2015-06-26 ENCOUNTER — Encounter (HOSPITAL_COMMUNITY): Payer: Self-pay

## 2015-06-26 DIAGNOSIS — J45909 Unspecified asthma, uncomplicated: Secondary | ICD-10-CM | POA: Insufficient documentation

## 2015-06-26 DIAGNOSIS — Z79899 Other long term (current) drug therapy: Secondary | ICD-10-CM | POA: Insufficient documentation

## 2015-06-26 DIAGNOSIS — B349 Viral infection, unspecified: Secondary | ICD-10-CM | POA: Insufficient documentation

## 2015-06-26 DIAGNOSIS — Z87891 Personal history of nicotine dependence: Secondary | ICD-10-CM | POA: Insufficient documentation

## 2015-06-26 DIAGNOSIS — Z8719 Personal history of other diseases of the digestive system: Secondary | ICD-10-CM | POA: Insufficient documentation

## 2015-06-26 LAB — URINALYSIS, ROUTINE W REFLEX MICROSCOPIC
Bilirubin Urine: NEGATIVE
Glucose, UA: NEGATIVE mg/dL
Hgb urine dipstick: NEGATIVE
KETONES UR: NEGATIVE mg/dL
LEUKOCYTES UA: NEGATIVE
NITRITE: NEGATIVE
PROTEIN: NEGATIVE mg/dL
Specific Gravity, Urine: 1.03 (ref 1.005–1.030)
pH: 6.5 (ref 5.0–8.0)

## 2015-06-26 LAB — COMPREHENSIVE METABOLIC PANEL
ALK PHOS: 60 U/L (ref 38–126)
ALT: 60 U/L (ref 17–63)
ANION GAP: 8 (ref 5–15)
AST: 47 U/L — ABNORMAL HIGH (ref 15–41)
Albumin: 4.6 g/dL (ref 3.5–5.0)
BILIRUBIN TOTAL: 0.9 mg/dL (ref 0.3–1.2)
BUN: 16 mg/dL (ref 6–20)
CALCIUM: 9.6 mg/dL (ref 8.9–10.3)
CO2: 27 mmol/L (ref 22–32)
Chloride: 105 mmol/L (ref 101–111)
Creatinine, Ser: 0.82 mg/dL (ref 0.61–1.24)
GFR calc Af Amer: >60 mL/min (ref >60)
Glucose, Bld: 111 mg/dL — ABNORMAL HIGH (ref 65–99)
Potassium: 3.7 mmol/L (ref 3.5–5.1)
Sodium: 140 mmol/L (ref 135–145)
TOTAL PROTEIN: 8.2 g/dL — AB (ref 6.5–8.1)

## 2015-06-26 LAB — CBC WITH DIFFERENTIAL/PLATELET
Basophils Absolute: 0 10*3/uL (ref 0.0–0.1)
Basophils Relative: 0 %
Eosinophils Absolute: 0.1 10*3/uL (ref 0.0–0.7)
Eosinophils Relative: 1 %
HEMATOCRIT: 43.6 % (ref 39.0–52.0)
HEMOGLOBIN: 15.8 g/dL (ref 13.0–17.0)
Lymphocytes Relative: 29 %
Lymphs Abs: 2.7 10*3/uL (ref 0.7–4.0)
MCH: 30.6 pg (ref 26.0–34.0)
MCHC: 36.2 g/dL — AB (ref 30.0–36.0)
MCV: 84.3 fL (ref 78.0–100.0)
MONOS PCT: 6 %
Monocytes Absolute: 0.6 10*3/uL (ref 0.1–1.0)
NEUTROS ABS: 5.8 10*3/uL (ref 1.7–7.7)
NEUTROS PCT: 64 %
Platelets: 314 10*3/uL (ref 150–400)
RBC: 5.17 MIL/uL (ref 4.22–5.81)
RDW: 12.7 % (ref 11.5–15.5)
WBC: 9.3 10*3/uL (ref 4.0–10.5)

## 2015-06-26 MED ORDER — ONDANSETRON 4 MG PO TBDP: 4.0000 mg | ORAL_TABLET | Freq: Three times a day (TID) | ORAL | Status: DC | PRN

## 2015-06-26 MED ORDER — DIPHENHYDRAMINE HCL 50 MG/ML IJ SOLN
12.5000 mg | Freq: Once | INTRAMUSCULAR | Status: AC
  Administered 2015-06-26: 12.5 mg via INTRAVENOUS
  Filled 2015-06-26: qty 1

## 2015-06-26 MED ORDER — METOCLOPRAMIDE HCL 5 MG/ML IJ SOLN
10.0000 mg | Freq: Once | INTRAMUSCULAR | Status: AC
  Administered 2015-06-26: 10 mg via INTRAVENOUS
  Filled 2015-06-26: qty 2

## 2015-06-26 MED ORDER — KETOROLAC TROMETHAMINE 30 MG/ML IJ SOLN
30.0000 mg | Freq: Once | INTRAMUSCULAR | Status: AC
  Administered 2015-06-26: 30 mg via INTRAVENOUS
  Filled 2015-06-26: qty 1

## 2015-06-26 MED ORDER — ONDANSETRON HCL 4 MG/2ML IJ SOLN
4.0000 mg | Freq: Once | INTRAMUSCULAR | Status: AC
  Administered 2015-06-26: 4 mg via INTRAVENOUS
  Filled 2015-06-26: qty 2

## 2015-06-26 MED ORDER — SODIUM CHLORIDE 0.9 % IV BOLUS (SEPSIS)
1000.0000 mL | Freq: Once | INTRAVENOUS | Status: AC
  Administered 2015-06-26: 1000 mL via INTRAVENOUS

## 2015-06-26 MED ORDER — NAPROXEN 500 MG PO TABS: 500.0000 mg | ORAL_TABLET | Freq: Two times a day (BID) | ORAL | Status: DC

## 2015-06-26 NOTE — ED Notes (Signed)
Patient given ginger ale to drink 

## 2015-06-26 NOTE — ED Provider Notes (Signed)
CSN: [id number]     Arrival date & time 06/26/15  0845 History   First MD Initiated Contact with Patient 06/26/15 [phone]     Chief Complaint  Patient presents with  . Dizziness  . Generalized Body Aches     HPI  Patient presents for evaluation of general illness. He reports 3-4 days of symptoms. Body aches, nausea. No vomiting. States she's feels weak and dizzy from not eating much. Has felt cold at times. Has not checked her temperature at home. Was not immunized for influenza. Has a history of hepatitis C. This was found with a follow-up for elevated liver enzymes. He has not undergone treatment. He's not had any recent jaundice. No dark urine or light stools. No abdominal pain. In particular, no right upper quadrant pain. No edema or abdominal distention.  Past Medical History  Diagnosis Date  . Asthma   . Hepatitis C   . Gastric ulcer    No past surgical history on file. No family history on file. Social History  Substance Use Topics  . Smoking status: Former Smoker    Types: Cigarettes  . Smokeless tobacco: None  . Alcohol Use: No    Review of Systems  Constitutional: Positive for chills, activity change, appetite change and fatigue. Negative for fever and diaphoresis.  HENT: Negative for mouth sores, sore throat and trouble swallowing.   Eyes: Negative for visual disturbance.  Respiratory: Negative for cough, chest tightness, shortness of breath and wheezing.   Cardiovascular: Negative for chest pain.  Gastrointestinal: Positive for nausea. Negative for vomiting, abdominal pain, diarrhea and abdominal distention.  Endocrine: Negative for polydipsia, polyphagia and polyuria.  Genitourinary: Negative for dysuria, frequency and hematuria.  Musculoskeletal: Positive for myalgias. Negative for gait problem.  Skin: Negative for color change, pallor and rash.  Neurological: Negative for dizziness, syncope, light-headedness and headaches.  Hematological: Does not bruise/bleed  easily.  Psychiatric/Behavioral: Negative for behavioral problems and confusion.      Allergies  Tylenol  Home Medications   Prior to Admission medications   Medication Sig Start Date End Date Taking? Authorizing Provider  Multiple Vitamin (MULTIVITAMIN WITH MINERALS) TABS tablet Take 1 tablet by mouth daily.   Yes Historical Provider, MD  Omega-3 Fatty Acids (OMEGA 3 PO) Take 1 capsule by mouth daily.   Yes Historical Provider, MD  omeprazole (PRILOSEC) 40 MG capsule TAKE ONE CAPSULE BY MOUTH EVERY MORNING daily 06/07/15  Yes Historical Provider, MD  QUEtiapine (SEROQUEL) 100 MG tablet Take 100 mg by mouth at bedtime.   Yes Historical Provider, MD  SUBOXONE 8-2 MG FILM TAKE ONE film UNDER THE TONGUE THREE DAILY 06/07/15  Yes Historical Provider, MD  naproxen (NAPROSYN) 500 MG tablet Take 1 tablet (500 mg total) by mouth 2 (two) times daily. 06/26/15   Rolland Porter, MD  ondansetron (ZOFRAN ODT) 4 MG disintegrating tablet Take 1 tablet (4 mg total) by mouth every 8 (eight) hours as needed for nausea. 06/26/15   Rolland Porter, MD   BP 155/99 mmHg  Pulse 71  Temp(Src) 97.9 F (36.6 C) (Oral)  Resp 17  SpO2 100% Physical Exam  Constitutional: He is oriented to person, place, and time. He appears well-developed and well-nourished. No distress.  Normal exam. He is not jaundiced. Conjunctiva not pale. No abdominal distention, pain, or ascites. No dependent edema. Her lungs. Afebrile. Well oxygenated.  HENT:  Head: Normocephalic.  Eyes: Conjunctivae are normal. Pupils are equal, round, and reactive to light. No scleral icterus.  Neck: Normal  range of motion. Neck supple. No thyromegaly present.  Cardiovascular: Normal rate and regular rhythm.  Exam reveals no gallop and no friction rub.   No murmur heard. Pulmonary/Chest: Effort normal and breath sounds normal. No respiratory distress. He has no wheezes. He has no rales.  Abdominal: Soft. Bowel sounds are normal. He exhibits no distension. There is  no tenderness. There is no rebound.  Musculoskeletal: Normal range of motion.  Neurological: He is alert and oriented to person, place, and time.  Skin: Skin is warm and dry. No rash noted.  Psychiatric: He has a normal mood and affect. His behavior is normal.    ED Course  Procedures (including critical care time) Labs Review Labs Reviewed  CBC WITH DIFFERENTIAL/PLATELET - Abnormal; Notable for the following:    MCHC 36.2 (*)    All other components within normal limits  COMPREHENSIVE METABOLIC PANEL - Abnormal; Notable for the following:    Glucose, Bld 111 (*)    Total Protein 8.2 (*)    AST 47 (*)    All other components within normal limits  URINALYSIS, ROUTINE W REFLEX MICROSCOPIC (NOT AT [facility]) - Abnormal; Notable for the following:    Color, Urine AMBER (*)    All other components within normal limits    Imaging Review No results found. I have personally reviewed and evaluated these images and lab results as part of my medical decision-making.   EKG Interpretation None      MDM   Final diagnoses:  Viral syndrome   Doubt this related to his hepatitis. We'll run his liver enzymes. Otherwise fluids, anti-inflammatories, antiemetics. We'll reassess.    Rolland Porter, MD 06/26/15 1100

## 2015-06-26 NOTE — Discharge Instructions (Signed)
Rest. Stay hydrated. Push fluids.  Zofran as needed for nausea.  Naproxen as needed for body aches.

## 2015-06-26 NOTE — ED Notes (Signed)
He reports recent dizziness, body aches and occasional fever for past few days of which he is concerned may be related to his dx of hepatitis c.  He is ambulatory and in no distress.

## 2015-06-26 NOTE — ED Notes (Signed)
Discharge instructions, follow up care, and rx x2 reviewed with patient. Patient verbalized understanding. 

## 2015-06-27 ENCOUNTER — Emergency Department (HOSPITAL_COMMUNITY)
Admission: EM | Admit: 2015-06-27 | Discharge: 2015-06-27 | Disposition: A | Discharge status: Home / Self Care | Payer: Self-pay | Attending: Emergency Medicine | Admitting: Emergency Medicine

## 2015-06-27 ENCOUNTER — Encounter (HOSPITAL_COMMUNITY): Payer: Self-pay | Admitting: Emergency Medicine

## 2015-06-27 ENCOUNTER — Emergency Department (HOSPITAL_COMMUNITY): Payer: Self-pay

## 2015-06-27 DIAGNOSIS — Z79899 Other long term (current) drug therapy: Secondary | ICD-10-CM | POA: Insufficient documentation

## 2015-06-27 DIAGNOSIS — Z8619 Personal history of other infectious and parasitic diseases: Secondary | ICD-10-CM | POA: Insufficient documentation

## 2015-06-27 DIAGNOSIS — J45909 Unspecified asthma, uncomplicated: Secondary | ICD-10-CM | POA: Insufficient documentation

## 2015-06-27 DIAGNOSIS — F419 Anxiety disorder, unspecified: Secondary | ICD-10-CM | POA: Insufficient documentation

## 2015-06-27 DIAGNOSIS — R61 Generalized hyperhidrosis: Secondary | ICD-10-CM | POA: Insufficient documentation

## 2015-06-27 DIAGNOSIS — Z8719 Personal history of other diseases of the digestive system: Secondary | ICD-10-CM | POA: Insufficient documentation

## 2015-06-27 DIAGNOSIS — R1084 Generalized abdominal pain: Secondary | ICD-10-CM

## 2015-06-27 DIAGNOSIS — Z791 Long term (current) use of non-steroidal anti-inflammatories (NSAID): Secondary | ICD-10-CM | POA: Insufficient documentation

## 2015-06-27 DIAGNOSIS — R6883 Chills (without fever): Secondary | ICD-10-CM | POA: Insufficient documentation

## 2015-06-27 DIAGNOSIS — R112 Nausea with vomiting, unspecified: Secondary | ICD-10-CM

## 2015-06-27 DIAGNOSIS — Z87891 Personal history of nicotine dependence: Secondary | ICD-10-CM | POA: Insufficient documentation

## 2015-06-27 LAB — COMPREHENSIVE METABOLIC PANEL
ALT: 51 U/L (ref 17–63)
ANION GAP: 10 (ref 5–15)
AST: 39 U/L (ref 15–41)
Albumin: 4.6 g/dL (ref 3.5–5.0)
Alkaline Phosphatase: 58 U/L (ref 38–126)
BILIRUBIN TOTAL: 0.4 mg/dL (ref 0.3–1.2)
BUN: 16 mg/dL (ref 6–20)
CO2: 24 mmol/L (ref 22–32)
Calcium: 9.7 mg/dL (ref 8.9–10.3)
Chloride: 107 mmol/L (ref 101–111)
Creatinine, Ser: 0.95 mg/dL (ref 0.61–1.24)
Glucose, Bld: 120 mg/dL — ABNORMAL HIGH (ref 65–99)
POTASSIUM: 4.2 mmol/L (ref 3.5–5.1)
Sodium: 141 mmol/L (ref 135–145)
TOTAL PROTEIN: 8.2 g/dL — AB (ref 6.5–8.1)

## 2015-06-27 LAB — CBC WITH DIFFERENTIAL/PLATELET
Basophils Absolute: 0 10*3/uL (ref 0.0–0.1)
Basophils Relative: 0 %
Eosinophils Absolute: 0 10*3/uL (ref 0.0–0.7)
Eosinophils Relative: 0 %
HEMATOCRIT: 43.3 % (ref 39.0–52.0)
Hemoglobin: 15.6 g/dL (ref 13.0–17.0)
LYMPHS PCT: 18 %
Lymphs Abs: 2.4 10*3/uL (ref 0.7–4.0)
MCH: 30.2 pg (ref 26.0–34.0)
MCHC: 36 g/dL (ref 30.0–36.0)
MCV: 83.8 fL (ref 78.0–100.0)
MONO ABS: 0.6 10*3/uL (ref 0.1–1.0)
MONOS PCT: 4 %
NEUTROS ABS: 10.1 10*3/uL — AB (ref 1.7–7.7)
Neutrophils Relative %: 78 %
Platelets: 347 10*3/uL (ref 150–400)
RBC: 5.17 MIL/uL (ref 4.22–5.81)
RDW: 12.6 % (ref 11.5–15.5)
WBC: 13.1 10*3/uL — ABNORMAL HIGH (ref 4.0–10.5)

## 2015-06-27 LAB — URINALYSIS, ROUTINE W REFLEX MICROSCOPIC
Bilirubin Urine: NEGATIVE
GLUCOSE, UA: NEGATIVE mg/dL
Hgb urine dipstick: NEGATIVE
KETONES UR: 40 mg/dL — AB
LEUKOCYTES UA: NEGATIVE
NITRITE: NEGATIVE
PROTEIN: NEGATIVE mg/dL
Specific Gravity, Urine: 1.024 (ref 1.005–1.030)
pH: 7.5 (ref 5.0–8.0)

## 2015-06-27 LAB — LIPASE, BLOOD: LIPASE: 34 U/L (ref 11–51)

## 2015-06-27 MED ORDER — SODIUM CHLORIDE 0.9 % IV BOLUS (SEPSIS)
1000.0000 mL | Freq: Once | INTRAVENOUS | Status: AC
  Administered 2015-06-27: 1000 mL via INTRAVENOUS

## 2015-06-27 MED ORDER — PROMETHAZINE HCL 25 MG RE SUPP: 25.0000 mg | Freq: Four times a day (QID) | RECTAL | Status: DC | PRN

## 2015-06-27 MED ORDER — IOHEXOL 300 MG/ML  SOLN
50.0000 mL | Freq: Once | INTRAMUSCULAR | Status: AC | PRN
  Administered 2015-06-27: 50 mL via ORAL

## 2015-06-27 MED ORDER — IOPAMIDOL (ISOVUE-300) INJECTION 61%
100.0000 mL | Freq: Once | INTRAVENOUS | Status: AC | PRN
  Administered 2015-06-27: 100 mL via INTRAVENOUS

## 2015-06-27 MED ORDER — METOCLOPRAMIDE HCL 5 MG/ML IJ SOLN
10.0000 mg | Freq: Once | INTRAMUSCULAR | Status: AC
  Administered 2015-06-27: 10 mg via INTRAVENOUS
  Filled 2015-06-27: qty 2

## 2015-06-27 MED ORDER — METOCLOPRAMIDE HCL 5 MG/ML IJ SOLN
20.0000 mg | Freq: Once | INTRAVENOUS | Status: DC
  Filled 2015-06-27: qty 4

## 2015-06-27 MED ORDER — FAMOTIDINE IN NACL 20-0.9 MG/50ML-% IV SOLN
20.0000 mg | Freq: Once | INTRAVENOUS | Status: AC
  Administered 2015-06-27: 20 mg via INTRAVENOUS
  Filled 2015-06-27: qty 50

## 2015-06-27 MED ORDER — ONDANSETRON 8 MG PO TBDP
8.0000 mg | ORAL_TABLET | Freq: Once | ORAL | Status: AC
  Administered 2015-06-27: 8 mg via ORAL
  Filled 2015-06-27: qty 1

## 2015-06-27 MED ORDER — KETOROLAC TROMETHAMINE 30 MG/ML IJ SOLN
30.0000 mg | Freq: Once | INTRAMUSCULAR | Status: AC
  Administered 2015-06-27: 30 mg via INTRAVENOUS
  Filled 2015-06-27: qty 1

## 2015-06-27 MED ORDER — FAMOTIDINE 20 MG PO TABS: 20.0000 mg | ORAL_TABLET | Freq: Two times a day (BID) | ORAL | Status: DC

## 2015-06-27 MED ORDER — HYDROMORPHONE HCL 1 MG/ML IJ SOLN
1.0000 mg | Freq: Once | INTRAMUSCULAR | Status: AC
  Administered 2015-06-27: 1 mg via INTRAVENOUS
  Filled 2015-06-27: qty 1

## 2015-06-27 NOTE — ED Notes (Signed)
Patient transported to CT 

## 2015-06-27 NOTE — ED Provider Notes (Signed)
CSN: [id number]     Arrival date & time 06/27/15  0802 History   First MD Initiated Contact with Patient 06/27/15 [phone]     Chief Complaint  Patient presents with  . Abdominal Pain  . Emesis  . Nausea   HPI Comments: 35 year old male who presents with N/V for the past 4-5 days. He was seen yesterday and treated for a viral type illness with fluids and anti-emetics due to generalized malaise and nausea. He presents 1 day later due to worsening symptoms. He has been taking Zofran as prescribed without relief. Today he states he has been vomiting all morning along with new abdominal pain. Reports associated chills and inability to tolerate PO. Denies headache, chest pain, SOB, diarrhea, dysuria. He has a hx of heroin abuse, currently on Suboxone. Last dose was this morning. Also has a history of Hep C and gastric ulcers.   Patient is a 35 y.o. male presenting with abdominal pain and vomiting.  Abdominal Pain Associated symptoms: chills, nausea and vomiting   Associated symptoms: no chest pain, no diarrhea, no dysuria, no fever and no shortness of breath   Emesis Associated symptoms: abdominal pain and chills   Associated symptoms: no diarrhea     Past Medical History  Diagnosis Date  . Asthma   . Hepatitis C   . Gastric ulcer    History reviewed. No pertinent past surgical history. History reviewed. No pertinent family history. Social History  Substance Use Topics  . Smoking status: Former Smoker    Types: Cigarettes  . Smokeless tobacco: None  . Alcohol Use: No    Review of Systems  Constitutional: Positive for chills and diaphoresis. Negative for fever.  Respiratory: Negative for shortness of breath.   Cardiovascular: Negative for chest pain.  Gastrointestinal: Positive for nausea, vomiting and abdominal pain. Negative for diarrhea.  Genitourinary: Negative for dysuria.  All other systems reviewed and are negative.  Allergies  Tylenol  Home Medications   Prior to  Admission medications   Medication Sig Start Date End Date Taking? Authorizing Provider  Multiple Vitamin (MULTIVITAMIN WITH MINERALS) TABS tablet Take 1 tablet by mouth daily.    Historical Provider, MD  naproxen (NAPROSYN) 500 MG tablet Take 1 tablet (500 mg total) by mouth 2 (two) times daily. 06/26/15   Rolland Porter, MD  Omega-3 Fatty Acids (OMEGA 3 PO) Take 1 capsule by mouth daily.    Historical Provider, MD  omeprazole (PRILOSEC) 40 MG capsule TAKE ONE CAPSULE BY MOUTH EVERY MORNING daily 06/07/15   Historical Provider, MD  ondansetron (ZOFRAN ODT) 4 MG disintegrating tablet Take 1 tablet (4 mg total) by mouth every 8 (eight) hours as needed for nausea. 06/26/15   Rolland Porter, MD  QUEtiapine (SEROQUEL) 100 MG tablet Take 100 mg by mouth at bedtime.    Historical Provider, MD  SUBOXONE 8-2 MG FILM TAKE ONE film UNDER THE TONGUE THREE DAILY 06/07/15   Historical Provider, MD   BP 157/83 mmHg  Pulse 83  Temp(Src) 98.3 F (36.8 C) (Oral)  Resp 20  SpO2 100%   Physical Exam  Constitutional: He is oriented to person, place, and time. He appears well-developed and well-nourished. He appears distressed.  Shaking in bed  HENT:  Head: Normocephalic and atraumatic.  Eyes: Conjunctivae are normal. Pupils are equal, round, and reactive to light. Right eye exhibits no discharge. Left eye exhibits no discharge. No scleral icterus.  Neck: Normal range of motion.  Cardiovascular: Normal rate and regular rhythm.  Exam reveals no gallop and no friction rub.   No murmur heard. Pulmonary/Chest: Effort normal. No respiratory distress. He has no wheezes. He has no rales. He exhibits no tenderness.  Abdominal: Soft. Bowel sounds are normal. He exhibits no distension and no mass. There is tenderness. There is no rebound and no guarding.  RLQ, RUQ, epigastric pain  Neurological: He is alert and oriented to person, place, and time.  Skin: Skin is warm. He is diaphoretic.  Psychiatric: His mood appears anxious.     ED Course  Procedures (including critical care time) Labs Review Labs Reviewed  CBC WITH DIFFERENTIAL/PLATELET - Abnormal; Notable for the following:    WBC 13.1 (*)    Neutro Abs 10.1 (*)    All other components within normal limits  COMPREHENSIVE METABOLIC PANEL - Abnormal; Notable for the following:    Glucose, Bld 120 (*)    Total Protein 8.2 (*)    All other components within normal limits  URINALYSIS, ROUTINE W REFLEX MICROSCOPIC (NOT AT [facility]) - Abnormal; Notable for the following:    Ketones, ur 40 (*)    All other components within normal limits  LIPASE, BLOOD    Imaging Review Ct Abdomen Pelvis W Contrast  06/27/2015  CLINICAL DATA:  Nausea, vomiting, right-sided abdominal pain for a few days. Right lower quadrant pain. EXAM: CT ABDOMEN AND PELVIS WITH CONTRAST TECHNIQUE: Multidetector CT imaging of the abdomen and pelvis was performed using the standard protocol following bolus administration of intravenous contrast. CONTRAST:  ISOVUE-300 IOPAMIDOL (ISOVUE-300) INJECTION 61% COMPARISON:  12/18/2013 FINDINGS: Lower chest: Lung bases are clear. No effusions. Heart is normal size. Hepatobiliary: No focal hepatic abnormality. Gallbladder unremarkable. Pancreas: Normal appearance Spleen: Normal appearance Adrenals/Urinary Tract: No adrenal abnormality. No focal renal abnormality. No stones or hydronephrosis. Urinary bladder is unremarkable. Stomach/Bowel: Appendix is normal. Stomach, large and small bowel grossly unremarkable. Vascular/Lymphatic: No evidence of aneurysm or adenopathy. Reproductive: Grossly unremarkable. Other: No free fluid or free air. Musculoskeletal: No acute bony abnormality or focal bone lesion. IMPRESSION: No acute findings in the abdomen or pelvis. Electronically Signed   By: Charlett Nose M.D.   On: 06/27/2015 09:48   I have personally reviewed and evaluated these images and lab results as part of my medical decision-making.   EKG Interpretation None       MDM   Final diagnoses:  Generalized abdominal pain  Non-intractable vomiting with nausea, vomiting of unspecified type   35 year old male presents with N/V, and new abdominal pain. Since pt was seen 24 hours ago with worsening symptoms, CT of abdomen was warranted. IVF started, Zofran and Dilaudid given. Pt complaining of some epigastric pain and irritation due to vomiting. Since he has a remote hx of gastric ulcers, will give Pepcid. He also takes a daily prilosec.  Labs show elevated WBC. CT scan of abdomen was unremarkable. After reevaluation, Pt appears much less anxious and diaphoretic but is still nauseous. Although he states he took his Suboxone this morning it's possible he is having some withdrawals as well. Pt informed he will not receive any more opiod pain meds here in the ED, however Toradol offered. Pt still complaining of nausea so Reglan given. Also there are ketones in urine so  will give more IVF.  Pt's mother is concerned that his symptoms may be coming from ulcers however, it's more likely to be gastritis since he has no blood in vomit or stool, and his VSS. Mother also raises concerns of  having to go home and still be vomiting. They are asking for a suppository which is reasonable since this is his 2nd visit in 24 hours. Pt feels improved after observation and/or treatment in ED.Marland Kitchen Patient and mother informed of clinical course, understand medical decision-making process, and agree with plan. Return precautions given.   Bethel Born, PA-C 06/27/15 1521  Raeford Razor, MD 06/29/15 [phone]

## 2015-06-27 NOTE — ED Notes (Addendum)
Was seen yesterday for same complaint of nausea, vomiting, and right sided abdominal pain for a few days. Pt states he's unable to keep down food or fluid. Denies diarrhea, fever. Pt appears pale in triage. Hx of gastric ulcer, denies any blood in vomit

## 2015-06-27 NOTE — ED Notes (Signed)
PA at bedside.

## 2015-06-27 NOTE — Discharge Instructions (Signed)
Drink plenty of fluids!  Insert suppository as needed for nausea and vomiting  Take Ibuprofen for your pain  Take Pepcid along with your prilosec to help with stomach irritation

## 2015-06-27 NOTE — ED Notes (Signed)
Discharge instructions, follow up care, and rx x2 reviewed with patient. Patient verbalized understanding. 

## 2016-02-16 ENCOUNTER — Emergency Department (HOSPITAL_COMMUNITY): Payer: Self-pay

## 2016-02-16 ENCOUNTER — Emergency Department (HOSPITAL_COMMUNITY)
Admission: EM | Admit: 2016-02-16 | Discharge: 2016-02-16 | Disposition: A | Discharge status: Home / Self Care | Payer: Self-pay | Attending: Emergency Medicine | Admitting: Emergency Medicine

## 2016-02-16 ENCOUNTER — Encounter (HOSPITAL_COMMUNITY): Payer: Self-pay | Admitting: Emergency Medicine

## 2016-02-16 DIAGNOSIS — Z23 Encounter for immunization: Secondary | ICD-10-CM | POA: Insufficient documentation

## 2016-02-16 DIAGNOSIS — Z79899 Other long term (current) drug therapy: Secondary | ICD-10-CM | POA: Insufficient documentation

## 2016-02-16 DIAGNOSIS — S61011A Laceration without foreign body of right thumb without damage to nail, initial encounter: Secondary | ICD-10-CM | POA: Insufficient documentation

## 2016-02-16 DIAGNOSIS — Z87891 Personal history of nicotine dependence: Secondary | ICD-10-CM | POA: Insufficient documentation

## 2016-02-16 DIAGNOSIS — Y939 Activity, unspecified: Secondary | ICD-10-CM | POA: Insufficient documentation

## 2016-02-16 DIAGNOSIS — Y929 Unspecified place or not applicable: Secondary | ICD-10-CM | POA: Insufficient documentation

## 2016-02-16 DIAGNOSIS — Y999 Unspecified external cause status: Secondary | ICD-10-CM | POA: Insufficient documentation

## 2016-02-16 DIAGNOSIS — J45909 Unspecified asthma, uncomplicated: Secondary | ICD-10-CM | POA: Insufficient documentation

## 2016-02-16 DIAGNOSIS — T148XXA Other injury of unspecified body region, initial encounter: Secondary | ICD-10-CM

## 2016-02-16 MED ORDER — TETANUS-DIPHTH-ACELL PERTUSSIS 5-2.5-18.5 LF-MCG/0.5 IM SUSP
0.5000 mL | Freq: Once | INTRAMUSCULAR | Status: AC
  Administered 2016-02-16: 0.5 mL via INTRAMUSCULAR
  Filled 2016-02-16: qty 0.5

## 2016-02-16 MED ORDER — LIDOCAINE HCL (PF) 1 % IJ SOLN
5.0000 mL | Freq: Once | INTRAMUSCULAR | Status: AC
  Administered 2016-02-16: 5 mL
  Filled 2016-02-16: qty 30
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
## 2017-01-08 ENCOUNTER — Encounter (HOSPITAL_COMMUNITY): Payer: Self-pay | Admitting: Emergency Medicine

## 2017-01-08 ENCOUNTER — Encounter (HOSPITAL_COMMUNITY): Payer: Self-pay

## 2017-01-08 ENCOUNTER — Emergency Department (HOSPITAL_COMMUNITY)
Admission: EM | Admit: 2017-01-08 | Discharge: 2017-01-08 | Disposition: A | Discharge status: Other Acute Inpatient Hospital | Payer: Self-pay | Attending: Emergency Medicine | Admitting: Emergency Medicine

## 2017-01-08 ENCOUNTER — Inpatient Hospital Stay (HOSPITAL_COMMUNITY)
Admission: AD | Admit: 2017-01-08 | Discharge: 2017-01-11 | DRG: 897 | Disposition: A | Discharge status: Home / Self Care | Payer: Federal, State, Local not specified - Other | Attending: Psychiatry | Admitting: Psychiatry

## 2017-01-08 DIAGNOSIS — F1120 Opioid dependence, uncomplicated: Secondary | ICD-10-CM | POA: Diagnosis not present

## 2017-01-08 DIAGNOSIS — Z79899 Other long term (current) drug therapy: Secondary | ICD-10-CM

## 2017-01-08 DIAGNOSIS — J45909 Unspecified asthma, uncomplicated: Secondary | ICD-10-CM | POA: Diagnosis present

## 2017-01-08 DIAGNOSIS — F1124 Opioid dependence with opioid-induced mood disorder: Principal | ICD-10-CM | POA: Diagnosis present

## 2017-01-08 DIAGNOSIS — T401X2A Poisoning by heroin, intentional self-harm, initial encounter: Secondary | ICD-10-CM

## 2017-01-08 DIAGNOSIS — R45851 Suicidal ideations: Secondary | ICD-10-CM | POA: Insufficient documentation

## 2017-01-08 DIAGNOSIS — G4700 Insomnia, unspecified: Secondary | ICD-10-CM | POA: Diagnosis present

## 2017-01-08 DIAGNOSIS — Z87891 Personal history of nicotine dependence: Secondary | ICD-10-CM

## 2017-01-08 DIAGNOSIS — Z818 Family history of other mental and behavioral disorders: Secondary | ICD-10-CM

## 2017-01-08 DIAGNOSIS — F1994 Other psychoactive substance use, unspecified with psychoactive substance-induced mood disorder: Secondary | ICD-10-CM

## 2017-01-08 DIAGNOSIS — Z886 Allergy status to analgesic agent status: Secondary | ICD-10-CM

## 2017-01-08 DIAGNOSIS — Z915 Personal history of self-harm: Secondary | ICD-10-CM

## 2017-01-08 DIAGNOSIS — F1920 Other psychoactive substance dependence, uncomplicated: Secondary | ICD-10-CM | POA: Insufficient documentation

## 2017-01-08 DIAGNOSIS — Z23 Encounter for immunization: Secondary | ICD-10-CM

## 2017-01-08 DIAGNOSIS — B1920 Unspecified viral hepatitis C without hepatic coma: Secondary | ICD-10-CM | POA: Diagnosis present

## 2017-01-08 DIAGNOSIS — I10 Essential (primary) hypertension: Secondary | ICD-10-CM | POA: Diagnosis present

## 2017-01-08 DIAGNOSIS — F419 Anxiety disorder, unspecified: Secondary | ICD-10-CM | POA: Diagnosis present

## 2017-01-08 DIAGNOSIS — F39 Unspecified mood [affective] disorder: Secondary | ICD-10-CM | POA: Diagnosis not present

## 2017-01-08 DIAGNOSIS — F1590 Other stimulant use, unspecified, uncomplicated: Secondary | ICD-10-CM | POA: Diagnosis present

## 2017-01-08 DIAGNOSIS — Z8711 Personal history of peptic ulcer disease: Secondary | ICD-10-CM

## 2017-01-08 DIAGNOSIS — T1491XA Suicide attempt, initial encounter: Secondary | ICD-10-CM | POA: Diagnosis not present

## 2017-01-08 HISTORY — DX: Essential (primary) hypertension: I10

## 2017-01-08 LAB — COMPREHENSIVE METABOLIC PANEL
ALBUMIN: 4.7 g/dL (ref 3.5–5.0)
ALK PHOS: 64 U/L (ref 38–126)
ALT: 121 U/L — AB (ref 17–63)
AST: 63 U/L — AB (ref 15–41)
Anion gap: 10 (ref 5–15)
BILIRUBIN TOTAL: 0.4 mg/dL (ref 0.3–1.2)
BUN: 14 mg/dL (ref 6–20)
CO2: 31 mmol/L (ref 22–32)
Calcium: 9.5 mg/dL (ref 8.9–10.3)
Chloride: 98 mmol/L — ABNORMAL LOW (ref 101–111)
Creatinine, Ser: 1.05 mg/dL (ref 0.61–1.24)
GFR calc Af Amer: >60 mL/min (ref >60)
GFR calc non Af Amer: >60 mL/min (ref >60)
Glucose, Bld: 89 mg/dL (ref 65–99)
Potassium: 3.7 mmol/L (ref 3.5–5.1)
SODIUM: 139 mmol/L (ref 135–145)
Total Protein: 8.3 g/dL — ABNORMAL HIGH (ref 6.5–8.1)

## 2017-01-08 LAB — RAPID URINE DRUG SCREEN, HOSP PERFORMED
AMPHETAMINES: POSITIVE — AB
BENZODIAZEPINES: POSITIVE — AB
Barbiturates: NOT DETECTED
Cocaine: NOT DETECTED
OPIATES: POSITIVE — AB
Tetrahydrocannabinol: NOT DETECTED

## 2017-01-08 LAB — CBC
HCT: 48.4 % (ref 39.0–52.0)
Hemoglobin: 17 g/dL (ref 13.0–17.0)
MCH: 32.4 pg (ref 26.0–34.0)
MCHC: 35.1 g/dL (ref 30.0–36.0)
MCV: 92.2 fL (ref 78.0–100.0)
Platelets: 263 10*3/uL (ref 150–400)
RBC: 5.25 MIL/uL (ref 4.22–5.81)
RDW: 13.1 % (ref 11.5–15.5)
WBC: 13.7 10*3/uL — AB (ref 4.0–10.5)

## 2017-01-08 LAB — CBG MONITORING, ED: Glucose-Capillary: 103 mg/dL — ABNORMAL HIGH (ref 65–99)

## 2017-01-08 LAB — ACETAMINOPHEN LEVEL: Acetaminophen (Tylenol), Serum: <10 ug/mL — ABNORMAL LOW (ref 10–30)

## 2017-01-08 LAB — SALICYLATE LEVEL: Salicylate Lvl: <7 mg/dL (ref 2.8–30.0)

## 2017-01-08 LAB — ETHANOL

## 2017-01-08 MED ORDER — NAPROXEN 500 MG PO TABS
500.0000 mg | ORAL_TABLET | Freq: Two times a day (BID) | ORAL | Status: DC | PRN
  Administered 2017-01-09 – 2017-01-11 (×2): 500 mg via ORAL
  Filled 2017-01-08 (×2): qty 1

## 2017-01-08 MED ORDER — NAPROXEN 500 MG PO TABS: 500.0000 mg | ORAL_TABLET | Freq: Two times a day (BID) | ORAL | Status: DC | PRN

## 2017-01-08 MED ORDER — METHOCARBAMOL 500 MG PO TABS: 500.0000 mg | ORAL_TABLET | Freq: Three times a day (TID) | ORAL | Status: DC | PRN

## 2017-01-08 MED ORDER — TRAZODONE HCL 50 MG PO TABS
50.0000 mg | ORAL_TABLET | Freq: Every evening | ORAL | Status: DC | PRN
  Administered 2017-01-08: 50 mg via ORAL
  Filled 2017-01-08: qty 1
  Filled 2017-01-08: qty 7
  Filled 2017-01-08: qty 1

## 2017-01-08 MED ORDER — CLONIDINE HCL 0.1 MG PO TABS: 0.1000 mg | ORAL_TABLET | ORAL | Status: DC

## 2017-01-08 MED ORDER — SODIUM CHLORIDE 0.9 % IV BOLUS (SEPSIS)
1000.0000 mL | Freq: Once | INTRAVENOUS | Status: DC
  Administered 2017-01-08: 1000 mL via INTRAVENOUS

## 2017-01-08 MED ORDER — THIAMINE HCL 100 MG/ML IJ SOLN: 100.0000 mg | Freq: Once | INTRAMUSCULAR | Status: DC

## 2017-01-08 MED ORDER — CLONIDINE HCL 0.1 MG PO TABS
0.1000 mg | ORAL_TABLET | Freq: Four times a day (QID) | ORAL | Status: AC
  Administered 2017-01-08 – 2017-01-09 (×4): 0.1 mg via ORAL
  Filled 2017-01-08 (×7): qty 1

## 2017-01-08 MED ORDER — INFLUENZA VAC SPLIT QUAD 0.5 ML IM SUSY
0.5000 mL | PREFILLED_SYRINGE | INTRAMUSCULAR | Status: AC
  Administered 2017-01-09: 0.5 mL via INTRAMUSCULAR
  Filled 2017-01-08: qty 0.5

## 2017-01-08 MED ORDER — HYDROXYZINE HCL 25 MG PO TABS
25.0000 mg | ORAL_TABLET | Freq: Four times a day (QID) | ORAL | Status: DC | PRN
  Administered 2017-01-09 – 2017-01-10 (×2): 25 mg via ORAL
  Filled 2017-01-08: qty 1
  Filled 2017-01-08: qty 10
  Filled 2017-01-08: qty 1

## 2017-01-08 MED ORDER — ONDANSETRON 4 MG PO TBDP: 4.0000 mg | ORAL_TABLET | Freq: Four times a day (QID) | ORAL | Status: DC | PRN

## 2017-01-08 MED ORDER — ADULT MULTIVITAMIN W/MINERALS CH
1.0000 | ORAL_TABLET | Freq: Every day | ORAL | Status: DC
  Administered 2017-01-08: 1 via ORAL
  Filled 2017-01-08: qty 1

## 2017-01-08 MED ORDER — CLONIDINE HCL 0.1 MG PO TABS: 0.1000 mg | ORAL_TABLET | Freq: Every day | ORAL | Status: DC

## 2017-01-08 MED ORDER — CLONIDINE HCL 0.1 MG PO TABS
0.1000 mg | ORAL_TABLET | Freq: Two times a day (BID) | ORAL | Status: DC
  Administered 2017-01-08: 0.1 mg via ORAL
  Filled 2017-01-08: qty 1

## 2017-01-08 MED ORDER — GABAPENTIN 300 MG PO CAPS
300.0000 mg | ORAL_CAPSULE | Freq: Three times a day (TID) | ORAL | Status: DC
  Administered 2017-01-08 – 2017-01-11 (×9): 300 mg via ORAL
  Filled 2017-01-08 (×9): qty 1
  Filled 2017-01-08 (×2): qty 21
  Filled 2017-01-08 (×4): qty 1
  Filled 2017-01-08: qty 21

## 2017-01-08 MED ORDER — VITAMIN B-1 100 MG PO TABS
100.0000 mg | ORAL_TABLET | Freq: Every day | ORAL | Status: DC
  Administered 2017-01-09 – 2017-01-11 (×3): 100 mg via ORAL
  Filled 2017-01-08 (×5): qty 1

## 2017-01-08 MED ORDER — MAGNESIUM HYDROXIDE 400 MG/5ML PO SUSP: 30.0000 mL | Freq: Every day | ORAL | Status: DC | PRN

## 2017-01-08 MED ORDER — SODIUM CHLORIDE 0.9 % IV SOLN: INTRAVENOUS | Status: DC

## 2017-01-08 MED ORDER — HYDROXYZINE HCL 25 MG PO TABS: 25.0000 mg | ORAL_TABLET | Freq: Four times a day (QID) | ORAL | Status: DC | PRN

## 2017-01-08 MED ORDER — ALPRAZOLAM 1 MG PO TABS: 2.0000 mg | ORAL_TABLET | Freq: Three times a day (TID) | ORAL | Status: DC | PRN

## 2017-01-08 MED ORDER — PNEUMOCOCCAL VAC POLYVALENT 25 MCG/0.5ML IJ INJ
0.5000 mL | INJECTION | INTRAMUSCULAR | Status: AC
  Administered 2017-01-09: 0.5 mL via INTRAMUSCULAR

## 2017-01-08 MED ORDER — SODIUM CHLORIDE 0.9 % IV BOLUS (SEPSIS)
1000.0000 mL | Freq: Once | INTRAVENOUS | Status: AC
  Administered 2017-01-08: 1000 mL via INTRAVENOUS

## 2017-01-08 MED ORDER — LORAZEPAM 1 MG PO TABS
1.0000 mg | ORAL_TABLET | Freq: Two times a day (BID) | ORAL | Status: AC
  Administered 2017-01-10 (×2): 1 mg via ORAL
  Filled 2017-01-08 (×2): qty 1

## 2017-01-08 MED ORDER — LOPERAMIDE HCL 2 MG PO CAPS: 2.0000 mg | ORAL_CAPSULE | ORAL | Status: DC | PRN

## 2017-01-08 MED ORDER — CLONIDINE HCL 0.1 MG PO TABS
0.1000 mg | ORAL_TABLET | Freq: Four times a day (QID) | ORAL | Status: DC
  Administered 2017-01-08: 0.1 mg via ORAL
  Filled 2017-01-08: qty 1

## 2017-01-08 MED ORDER — ONDANSETRON HCL 4 MG/2ML IJ SOLN
4.0000 mg | Freq: Once | INTRAMUSCULAR | Status: AC
  Administered 2017-01-08: 4 mg via INTRAVENOUS
  Filled 2017-01-08: qty 2

## 2017-01-08 MED ORDER — LORAZEPAM 1 MG PO TABS
1.0000 mg | ORAL_TABLET | Freq: Four times a day (QID) | ORAL | Status: AC | PRN
  Administered 2017-01-11: 1 mg via ORAL
  Filled 2017-01-08: qty 1

## 2017-01-08 MED ORDER — GABAPENTIN 300 MG PO CAPS
300.0000 mg | ORAL_CAPSULE | Freq: Three times a day (TID) | ORAL | Status: DC
  Administered 2017-01-08: 300 mg via ORAL
  Filled 2017-01-08: qty 1

## 2017-01-08 MED ORDER — DICYCLOMINE HCL 20 MG PO TABS: 20.0000 mg | ORAL_TABLET | Freq: Four times a day (QID) | ORAL | Status: DC | PRN

## 2017-01-08 MED ORDER — CLONIDINE HCL 0.1 MG PO TABS
0.1000 mg | ORAL_TABLET | Freq: Every day | ORAL | Status: DC
  Filled 2017-01-08 (×2): qty 1

## 2017-01-08 MED ORDER — LORAZEPAM 1 MG PO TABS
1.0000 mg | ORAL_TABLET | Freq: Four times a day (QID) | ORAL | Status: AC
  Administered 2017-01-08 (×2): 1 mg via ORAL
  Filled 2017-01-08 (×2): qty 1

## 2017-01-08 MED ORDER — ONDANSETRON 4 MG PO TBDP: 4.0000 mg | ORAL_TABLET | Freq: Three times a day (TID) | ORAL | Status: DC | PRN

## 2017-01-08 MED ORDER — TRAZODONE HCL 50 MG PO TABS: 50.0000 mg | ORAL_TABLET | Freq: Every evening | ORAL | Status: DC | PRN

## 2017-01-08 MED ORDER — LORAZEPAM 1 MG PO TABS
1.0000 mg | ORAL_TABLET | Freq: Three times a day (TID) | ORAL | Status: AC
  Administered 2017-01-09 (×3): 1 mg via ORAL
  Filled 2017-01-08 (×4): qty 1

## 2017-01-08 MED ORDER — CLONIDINE HCL 0.1 MG PO TABS
0.1000 mg | ORAL_TABLET | ORAL | Status: DC
  Administered 2017-01-10 – 2017-01-11 (×2): 0.1 mg via ORAL
  Filled 2017-01-08 (×4): qty 1

## 2017-01-08 MED ORDER — ALUM & MAG HYDROXIDE-SIMETH 200-200-20 MG/5ML PO SUSP: 30.0000 mL | ORAL | Status: DC | PRN

## 2017-01-08 MED ORDER — LORAZEPAM 1 MG PO TABS
1.0000 mg | ORAL_TABLET | Freq: Every day | ORAL | Status: AC
  Administered 2017-01-11: 1 mg via ORAL
  Filled 2017-01-08: qty 1

## 2017-01-08 MED ORDER — PANTOPRAZOLE SODIUM 40 MG PO TBEC
40.0000 mg | DELAYED_RELEASE_TABLET | Freq: Every day | ORAL | Status: DC
  Administered 2017-01-09 – 2017-01-11 (×3): 40 mg via ORAL
  Filled 2017-01-08: qty 7
  Filled 2017-01-08 (×4): qty 1

## 2017-01-08 MED ORDER — LORAZEPAM 1 MG PO TABS
1.0000 mg | ORAL_TABLET | ORAL | Status: DC | PRN
  Administered 2017-01-08: 1 mg via ORAL
  Filled 2017-01-08: qty 1

## 2017-01-08 MED ORDER — CARIPRAZINE HCL 1.5 MG PO CAPS
1.5000 mg | ORAL_CAPSULE | Freq: Two times a day (BID) | ORAL | Status: DC | PRN
  Filled 2017-01-08: qty 1

## 2017-01-08 MED ORDER — PANTOPRAZOLE SODIUM 40 MG PO TBEC
40.0000 mg | DELAYED_RELEASE_TABLET | Freq: Every day | ORAL | Status: DC
  Administered 2017-01-08: 40 mg via ORAL
  Filled 2017-01-08: qty 1

## 2017-01-08 MED ORDER — ADULT MULTIVITAMIN W/MINERALS CH
1.0000 | ORAL_TABLET | Freq: Every day | ORAL | Status: DC
  Administered 2017-01-09 – 2017-01-11 (×3): 1 via ORAL
  Filled 2017-01-08 (×3): qty 1
  Filled 2017-01-08: qty 7
  Filled 2017-01-08: qty 1

## 2017-01-08 NOTE — ED Provider Notes (Signed)
TIME SEEN: 3:46 AM  CHIEF COMPLAINT: intentional overdose  HPI: Pt is a 36 y.o. male with history of hepatitis C and previous IV drug abuse who presents to the emergency department after an intentional overdose tonight. Patient reports that he injected an unknown amount of heroin at an unknown time tonight in an attempt to kill himself.  He states that his girlfriend recently "lost her baby" and he was worried that she was going to leave him. He denies ever having a suicide attempt before but does state that this was a suicide attempt tonight. No HI or hallucinations. He denies any other coingestions other than alcohol today. He did report taking 2 Aleve earlier today. He has no current medical complaints other than nausea. EMS reports on first responders arrival patient was cyanotic and had agonal respirations. He was given 2 mg of intranasal Narcan. On EMS arrival, they placed an IV and gave him 2 mg of IV Narcan and patient became hemodynamically stable.  ROS: See HPI Constitutional: no fever  Eyes: no drainage  ENT: no runny nose   Cardiovascular:  no chest pain  Resp: no SOB  GI: no vomiting GU: no dysuria Integumentary: no rash  Allergy: no hives  Musculoskeletal: no leg swelling  Neurological: no slurred speech ROS otherwise negative  PAST MEDICAL HISTORY/PAST SURGICAL HISTORY:  Past Medical History:  Diagnosis Date  . Asthma   . Gastric ulcer   . Hepatitis C     MEDICATIONS:  Prior to Admission medications   Medication Sig Start Date End Date Taking? Authorizing Provider  famotidine (PEPCID) 20 MG tablet Take 1 tablet (20 mg total) by mouth 2 (two) times daily. 06/27/15   Bethel Born, PA-C  Multiple Vitamin (MULTIVITAMIN WITH MINERALS) TABS tablet Take 1 tablet by mouth daily.    [provider]  naproxen (NAPROSYN) 500 MG tablet Take 1 tablet (500 mg total) by mouth 2 (two) times daily. 06/26/15   Rolland Porter, MD  Omega-3 Fatty Acids (OMEGA 3 PO) Take 1 capsule  by mouth daily.    [provider]  omeprazole (PRILOSEC) 40 MG capsule TAKE ONE CAPSULE BY MOUTH EVERY MORNING daily 06/07/15   [provider]  ondansetron (ZOFRAN ODT) 4 MG disintegrating tablet Take 1 tablet (4 mg total) by mouth every 8 (eight) hours as needed for nausea. 06/26/15   Rolland Porter, MD  promethazine (PHENERGAN) 25 MG suppository Place 1 suppository (25 mg total) rectally every 6 (six) hours as needed for nausea or vomiting. 06/27/15   Bethel Born, PA-C  QUEtiapine (SEROQUEL) 100 MG tablet Take 100 mg by mouth at bedtime.    [provider]  SUBOXONE 8-2 MG FILM TAKE ONE film UNDER THE TONGUE THREE DAILY 06/07/15   [provider]    ALLERGIES:  Allergies  Allergen Reactions  . Tylenol [Acetaminophen] Rash    SOCIAL HISTORY:  Social History  Substance Use Topics  . Smoking status: Former Smoker    Types: Cigarettes  . Smokeless tobacco: Never Used  . Alcohol use No    FAMILY HISTORY: No family history on file.  EXAM: BP (!) 129/91 (BP Location: Left Arm)   Pulse 84   Temp 97.6 F (36.4 C) (Oral)   Resp 18   SpO2 98%  CONSTITUTIONAL: Alert and oriented and responds appropriately to questions. Well-appearing; well-nourished HEAD: Normocephalic EYES: Conjunctivae clear, pupils appear equal, EOMI ENT: normal nose; moist mucous membranes NECK: Supple, no meningismus, no nuchal rigidity, no LAD  CARD: RRR; S1 and S2 appreciated; no murmurs, no clicks, no rubs, no gallops RESP: Normal chest excursion without splinting or tachypnea; breath sounds clear and equal bilaterally; no wheezes, no rhonchi, no rales, no hypoxia or respiratory distress, speaking full sentences ABD/GI: Normal bowel sounds; non-distended; soft, non-tender, no rebound, no guarding, no peritoneal signs, no hepatosplenomegaly BACK:  The back appears normal and is non-tender to palpation, there is no CVA tenderness EXT: Normal ROM in all joints; non-tender to  palpation; no edema; normal capillary refill; no cyanosis, no calf tenderness or swelling    SKIN: Normal color for age and race; warm; no rash, diaphoretic NEURO: Moves all extremities equally PSYCH: Endorses suicidal ideation. No HI or hallucinations. Tearful.  MEDICAL DECISION MAKING: Pt here with intentional overdose. He agrees to stay voluntarily at this time. We will monitor him closely.  ED PROGRESS: Pt's labs have mild leukocytosis which is likely reactive. Mild elevation of AST and ALT in the setting of hepatitis C. Tylenol, salicylate, ethanol levels negative. Drug screen pending.  6:00 AM  Pt remains hemodynamically stable and not on oxygen. At this time he is medically cleared. Awaiting TTS evaluation and disposition. I feel he needs inpatient psychiatric treatment. This time he is here voluntarily. Initial IVC paperwork has been taken out by [facility] police department.  I have completed my portion of the IVC papers.   6:20 AM  D/w Elmore Guise with behavioral health. They have recommended inpatient treatment and I agree. No current beds available at [facility]. They will seek placement. Patient is under IVC.  6:40 AM  Pt's drug screen positive for opiates, benzodiazepines and amphetamines. This time he has been medically cleared. Home medications have been reordered. Patient awaiting psych placement.  I reviewed all nursing notes, vitals, pertinent previous records, EKGs, lab and urine results, imaging (as available).   EKG Interpretation  Date/Time:  Tuesday January 08 2017 03:47:47 EDT Ventricular Rate:  80 PR Interval:    QRS Duration: 110 QT Interval:  392 QTC Calculation: 453 R Axis:   57 Text Interpretation:  Sinus rhythm Borderline short PR interval Probable left atrial enlargement Left ventricular hypertrophy No significant change since last tracing Confirmed by Elba Schaber, Baxter Hire [phone]) on 01/08/2017 4:09:41 AM          [name], Layla Maw, DO 01/08/17 6213

## 2017-01-08 NOTE — Progress Notes (Signed)
Recreation Therapy Notes  Animal-Assisted Activity (AAA) Program Checklist/Progress Notes Patient Eligibility Criteria Checklist & Daily Group note for Rec TxIntervention  Date: 10.16.2018 Time: 2:45pm Location: [address]   AAA/T Program Assumption of Risk Form signed by Patient/ or Parent Legal Guardian Yes  Patient is free of allergies or sever asthma Yes  Patient reports no fear of animals Yes  Patient reports no history of cruelty to animals Yes  Patient understands his/her participation is voluntary Yes  Behavioral Response: Did not attend.   Marykay Lex Gizell Danser, LRT/CTRS        Charnae Lill L 01/08/2017 2:58 PM
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
## 2017-01-08 NOTE — ED Triage Notes (Signed)
Pt coming from home with intentional heroin overdose. 2mg  Narcan nasal and 2mg  Narcan IV given by EMS prior to arrival. Pt also reports drinking alcohol.   HR 104 Resp 18 100% RA 138/90 CBG 190

## 2017-01-08 NOTE — BH Assessment (Signed)
[facility] Assessment Progress Note  Per Thedore Mins, MD, this pt requires psychiatric hospitalization.  Malva Limes, RN, [facility] has assigned pt to [facility] Rm 301-2; they will be ready to receive pt at 13:00.  Pt presents under IVC initiated by pt's mother, and upheld by [facility], and IVC documents have been faxed to [facility].  Pt's nurse, Morrie Sheldon, has been notified, and agrees to call report to [phone].  Pt is to be transported via [employer].   Doylene Canning, MA Triage Specialist [phone]

## 2017-01-08 NOTE — Progress Notes (Signed)
Assumed care of patient from admitting nurse Heather RN. Spoke with patient 1:1 who is anxious, jittery, slightly tremulous and noticeably sweaty. CIWA is a "7" and COWS is a "6." VS obtained - 100/52, pulse 50 - and reviewed with [place], NP. Clonidine held, ativan and neurontin given per orders. Patient cooperative, receptive to care. Denies Si and remain safe at present. Currently in dining room with peers.

## 2017-01-08 NOTE — ED Notes (Signed)
Peer support at bedside 
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
## 2017-01-08 NOTE — B[facility] Counselor (Signed)
Per Donell Sievert, PA-C: Patient meets criteria for inpatient treatment.   Per [facility] Tori, RN: no available beds at this time.  TTS to seek placement.    [facility] EDP, Ward, MD notified at [phone] and Charge RN [place], [phone] .

## 2017-01-08 NOTE — ED Notes (Signed)
Patient denies pain and is resting comfortably.  

## 2017-01-08 NOTE — BHH Group Notes (Signed)
Adult Psychoeducational Group Note  Date:  01/08/2017 Time:  9:10 PM  Group Topic/Focus:  Wrap-Up Group:   The focus of this group is to help patients review their daily goal of treatment and discuss progress on daily workbooks.  Participation Level:  Active  Participation Quality:  Appropriate  Affect:  Appropriate  Cognitive:  Alert and Appropriate  Insight: Appropriate and Good  Engagement in Group:  Defensive and Distracting  Modes of Intervention:  Discussion  Additional Comments:  Pt stated that he would rate his day a 2 because he felt like the nurse at [facility] lied to him. Pt stated that one positive trait about himself is he is strong willed and determined. No SI/HI.    Berlin Hun A 01/08/2017, 9:10 PM
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
## 2017-01-08 NOTE — Progress Notes (Signed)
Jared [name] is a 36 year male being admitted involuntarily to [facility] from [facility].  He came in via EMS for intentional OD on heroin.  He reported drinking 12 pack of beer per day and smoking meth daily.  He reported stressors such as conflict with ex-wife, girlfriend lost baby 2 days ago, substance and ongoing anxiety.  He denied HI or A/V hallucinations.  He has medical history of HTN, Hep C, ulcers and asthma.  During [facility] admission, he denies SI/HI or A/V hallucinations.  He will contract for safety on the unit.  He was very drowsy and had to be woken up multiple times to finish admission process.  He denies any pain or discomfort and appears to be in no physical distress.  Oriented him to the unit.  Admission paperwork completed and signed.  Belongings searched and secured in locker # 37.  Skin assessment completed and noted abrasion to left wrist and puncture areas on L/R antecubital area.  Q 15 minute checks initiated for safety.  We will monitor the progress towards his goals.

## 2017-01-08 NOTE — ED Notes (Signed)
[employer] on unit to transport patient to [facility] per MD order. Patient ambulatory off unit in police custody. Patient belongings returned to patient.

## 2017-01-08 NOTE — ED Notes (Signed)
Visitor at bedside.

## 2017-01-08 NOTE — Tx Team (Signed)
Initial Treatment Plan 01/08/2017 3:11 PM [name] [id number]    PATIENT STRESSORS: Financial difficulties Legal issue Substance abuse Other: Girlfriend lost baby 2 days ago (at 5 months)   PATIENT STRENGTHS: Wellsite geologist fund of knowledge Work skills   PATIENT IDENTIFIED PROBLEMS: Depression  Suicidal ideation  Substance abuse  "Thinking get like it should be"  "Desire to keep being the person I was and not use drugs"             DISCHARGE CRITERIA:  Improved stabilization in mood, thinking, and/or behavior Verbal commitment to aftercare and medication compliance Withdrawal symptoms are absent or subacute and managed without 24-hour nursing intervention  PRELIMINARY DISCHARGE PLAN: Outpatient therapy Medication management  PATIENT/FAMILY INVOLVEMENT: This treatment plan has been presented to and reviewed with the patient, Jared [name].  The patient and family have been given the opportunity to ask questions and make suggestions.  Levin Bacon, RN 01/08/2017, 3:11 PM

## 2017-01-08 NOTE — Patient Outreach (Signed)
ED Peer Support Specialist Patient Intake (Complete at intake & 30-60 Day Follow-up)  Name: Jared [name]  MRN: [id number]  Age: 36 y.o.   Date of Admission: 01/08/2017  Intake: Initial Comments:      Primary Reason Admitted: Heroin overdose, suicidal ideations, Methamphetamine use   Lab values: Alcohol/ETOH: Negative Positive UDS? Yes Amphetamines: Yes Barbiturates: No Benzodiazepines: Yes Cocaine: No Opiates: Yes Cannabinoids: No  Demographic information: Gender: Male Ethnicity: White Marital Status: Separated Insurance Status: Uninsured/Self-pay Control and instrumentation engineer (Work Engineer, agricultural, Sales executive, etc.: No Lives with: Parent Living situation: House/Apartment  Reported Patient History: Patient reported health conditions: Depression, Anxiety disorders, Bipolar disorder (suicidal ideations) Patient aware of HIV and hepatitis status: Yes (comment) (Postive Hepatitis C)  In past year, has patient visited ED for any reason? Yes  Number of ED visits: 1  Reason(s) for visit: Hand issues  In past year, has patient been hospitalized for any reason? No  Number of hospitalizations:    Reason(s) for hospitalization:    In past year, has patient been arrested? Yes  Number of arrests: 2  Reason(s) for arrest: shoplifting, failing to appear  In past year, has patient been incarcerated? Yes  Number of incarcerations: 1  Reason(s) for incarceration: incarerated for 5 days  In past year, has patient received medication-assisted treatment? Yes, Office-based Opioid Treatment (OBOT) (Subxone from [facility] in [place])  In past year, patient received the following treatments:    In past year, has patient received any harm reduction services? No  Did this include any of the following?    In past year, has patient received care from a mental health provider for diagnosis other than SUD? Yes (Medication treatment with a doctor for major  depressive disorder order)  In past year, is this first time patient has overdosed? Yes  Number of past overdoses: 1  In past year, is this first time patient has been hospitalized for an overdose? Yes  Number of hospitalizations for overdose(s):    Is patient currently receiving treatment for a mental health diagnosis? Yes  Patient reports experiencing difficulty participating in SUD treatment: No    Most important reason(s) for this difficulty?    Has patient received prior services for treatment? Yes ([facility] for detox/long-term treatment back in 2016)  In past, patient has received services from following agencies:    Plan of Care:  Suggested follow up at these agencies/treatment centers:  (Patient is being transferred to Inpatient [facility]. Plan to help patient with long-term substance use treatment after he is discharged from [facility] inpatient. )  Other information:    Bartholomew Boards, CPSS  01/08/2017 11:41 AM

## 2017-01-09 DIAGNOSIS — R5383 Other fatigue: Secondary | ICD-10-CM

## 2017-01-09 DIAGNOSIS — T1491XA Suicide attempt, initial encounter: Secondary | ICD-10-CM

## 2017-01-09 DIAGNOSIS — R4587 Impulsiveness: Secondary | ICD-10-CM

## 2017-01-09 DIAGNOSIS — R4582 Worries: Secondary | ICD-10-CM

## 2017-01-09 DIAGNOSIS — R450 Nervousness: Secondary | ICD-10-CM

## 2017-01-09 DIAGNOSIS — F419 Anxiety disorder, unspecified: Secondary | ICD-10-CM

## 2017-01-09 DIAGNOSIS — F1120 Opioid dependence, uncomplicated: Secondary | ICD-10-CM

## 2017-01-09 DIAGNOSIS — G4700 Insomnia, unspecified: Secondary | ICD-10-CM

## 2017-01-09 DIAGNOSIS — T401X2A Poisoning by heroin, intentional self-harm, initial encounter: Secondary | ICD-10-CM

## 2017-01-09 DIAGNOSIS — R5381 Other malaise: Secondary | ICD-10-CM

## 2017-01-09 DIAGNOSIS — F1994 Other psychoactive substance use, unspecified with psychoactive substance-induced mood disorder: Secondary | ICD-10-CM

## 2017-01-09 MED ORDER — CARIPRAZINE HCL 1.5 MG PO CAPS
1.0000 | ORAL_CAPSULE | Freq: Two times a day (BID) | ORAL | Status: DC
  Administered 2017-01-09 – 2017-01-11 (×4): 1.5 mg via ORAL
  Filled 2017-01-09: qty 14
  Filled 2017-01-09 (×4): qty 1
  Filled 2017-01-09: qty 14
  Filled 2017-01-09 (×2): qty 1

## 2017-01-09 NOTE — Progress Notes (Signed)
Patient ID: Jared [name], male   DOB: 11-23-1980, 36 y.o.   MRN: [id number]  Pt currently presents with a flat affect and guarded, superficial behavior. Pt remains in bed tonight. Responds to Clinical research associate after multiple prompts. Forwards little. Pt states "I just want to sleep." Refuses medication.   Pt's labs and vitals were monitored throughout the night. Pt given a 1:1 about emotional and mental status. Pt supported and encouraged to express concerns and questions.   Pt's safety ensured with 15 minute and environmental checks. Pt currently denies SI/HI and A/V hallucinations. Pt verbally agrees to seek staff if SI/HI or A/VH occurs and to consult with staff before acting on any harmful thoughts. Will continue POC.

## 2017-01-09 NOTE — Tx Team (Signed)
Interdisciplinary Treatment and Diagnostic Plan Update  01/09/2017 Time of Session: 0830AM THAYNE OKIN MRN: [id number]  Principal Diagnosis: MDD Secondary Diagnoses: Active Problems:   Polysubstance dependence including opioid type drug, continuous use (HCC)   Current Medications:  Current Facility-Administered Medications  Medication Dose Route Frequency Provider Last Rate Last Dose  . alum & mag hydroxide-simeth (MAALOX/MYLANTA) 200-200-20 MG/5ML suspension 30 mL  30 mL Oral Q4H PRN Laveda Abbe, NP      . cloNIDine (CATAPRES) tablet 0.1 mg  0.1 mg Oral QID Laveda Abbe, NP   0.1 mg at 01/09/17 0835   Followed by  . [START ON 01/10/2017] cloNIDine (CATAPRES) tablet 0.1 mg  0.1 mg Oral BH-qamhs Laveda Abbe, NP       Followed by  . [START ON 01/12/2017] cloNIDine (CATAPRES) tablet 0.1 mg  0.1 mg Oral QAC breakfast Laveda Abbe, NP      . dicyclomine (BENTYL) tablet 20 mg  20 mg Oral Q6H PRN Laveda Abbe, NP      . gabapentin (NEURONTIN) capsule 300 mg  300 mg Oral TID Laveda Abbe, NP   300 mg at 01/09/17 5188  . hydrOXYzine (ATARAX/VISTARIL) tablet 25 mg  25 mg Oral Q6H PRN Laveda Abbe, NP      . loperamide (IMODIUM) capsule 2-4 mg  2-4 mg Oral PRN Laveda Abbe, NP      . LORazepam (ATIVAN) tablet 1 mg  1 mg Oral Q6H PRN Armandina Stammer I, NP      . LORazepam (ATIVAN) tablet 1 mg  1 mg Oral TID Armandina Stammer I, NP   1 mg at 01/09/17 4166   Followed by  . [START ON 01/10/2017] LORazepam (ATIVAN) tablet 1 mg  1 mg Oral BID Armandina Stammer I, NP       Followed by  . [START ON 01/11/2017] LORazepam (ATIVAN) tablet 1 mg  1 mg Oral Daily Nwoko, Agnes I, NP      . magnesium hydroxide (MILK OF MAGNESIA) suspension 30 mL  30 mL Oral Daily PRN Laveda Abbe, NP      . methocarbamol (ROBAXIN) tablet 500 mg  500 mg Oral Q8H PRN Laveda Abbe, NP      . multivitamin with minerals tablet 1 tablet  1 tablet  Oral Daily Laveda Abbe, NP   1 tablet at 01/09/17 0630  . naproxen (NAPROSYN) tablet 500 mg  500 mg Oral BID PRN Laveda Abbe, NP   500 mg at 01/09/17 0834  . ondansetron (ZOFRAN-ODT) disintegrating tablet 4 mg  4 mg Oral Q6H PRN Laveda Abbe, NP      . pantoprazole (PROTONIX) EC tablet 40 mg  40 mg Oral Daily Laveda Abbe, NP   40 mg at 01/09/17 0835  . thiamine (B-1) injection 100 mg  100 mg Intramuscular Once Nwoko, Nicole Kindred I, NP      . thiamine (VITAMIN B-1) tablet 100 mg  100 mg Oral Daily Armandina Stammer I, NP   100 mg at 01/09/17 0834  . traZODone (DESYREL) tablet 50 mg  50 mg Oral QHS PRN Laveda Abbe, NP   50 mg at 01/08/17 2111   PTA Medications: Prescriptions Prior to Admission  Medication Sig Dispense Refill Last Dose  . alprazolam (XANAX) 2 MG tablet Take 2 mg by mouth 3 (three) times daily as needed.  0 01/07/2017 at Unknown time  . Cariprazine HCl (VRAYLAR) 1.5 MG CAPS Take 1 capsule by  mouth 2 (two) times daily as needed (mood).   01/07/2017 at Unknown time  . cloNIDine (CATAPRES) 0.1 MG tablet Take 1 tablet by mouth 2 (two) times daily.  0 01/07/2017 at Unknown time    Patient Stressors: Financial difficulties Legal issue Substance abuse Other: Girlfriend lost baby 2 days ago (at 5 months)  Patient Strengths: Wellsite geologist fund of knowledge Work skills  Treatment Modalities: Medication Management, Group therapy, Case management,  1 to 1 session with clinician, Psychoeducation, Recreational therapy.   Physician Treatment Plan for Primary Diagnosis: MDD  Medication Management: Evaluate patient's response, side effects, and tolerance of medication regimen.  Therapeutic Interventions: 1 to 1 sessions, Unit Group sessions and Medication administration.  Evaluation of Outcomes: Progressing  Physician Treatment Plan for Secondary Diagnosis: Active Problems:   Polysubstance dependence including opioid type drug,  continuous use (HCC)     Medication Management: Evaluate patient's response, side effects, and tolerance of medication regimen.  Therapeutic Interventions: 1 to 1 sessions, Unit Group sessions and Medication administration.  Evaluation of Outcomes: Progressing   RN Treatment Plan for Primary Diagnosis: MDD Long Term Goal(s): Knowledge of disease and therapeutic regimen to maintain health will improve  Short Term Goals: Ability to remain free from injury will improve, Ability to demonstrate self-control, Ability to verbalize feelings will improve and Ability to disclose and discuss suicidal ideas  Medication Management: RN will administer medications as ordered by provider, will assess and evaluate patient's response and provide education to patient for prescribed medication. RN will report any adverse and/or side effects to prescribing provider.  Therapeutic Interventions: 1 on 1 counseling sessions, Psychoeducation, Medication administration, Evaluate responses to treatment, Monitor vital signs and CBGs as ordered, Perform/monitor CIWA, COWS, AIMS and Fall Risk screenings as ordered, Perform wound care treatments as ordered.  Evaluation of Outcomes: Progressing   LCSW Treatment Plan for Primary Diagnosis: MDD Long Term Goal(s): Safe transition to appropriate next level of care at discharge, Engage patient in therapeutic group addressing interpersonal concerns.  Short Term Goals: Engage patient in aftercare planning with referrals and resources, Facilitate patient progression through stages of change regarding substance use diagnoses and concerns and Identify triggers associated with mental health/substance abuse issues  Therapeutic Interventions: Assess for all discharge needs, 1 to 1 time with Social worker, Explore available resources and support systems, Assess for adequacy in community support network, Educate family and significant other(s) on suicide prevention, Complete  Psychosocial Assessment, Interpersonal group therapy.  Evaluation of Outcomes: Progressing   Progress in Treatment: Attending groups: Yes. Participating in groups: Yes. Taking medication as prescribed: Yes. Toleration medication: Yes. Family/Significant other contact made: No, will contact:  pt's mother for collateral contact and to complete SPE. Patient understands diagnosis: Yes. Discussing patient identified problems/goals with staff: Yes. Medical problems stabilized or resolved: Yes. Denies suicidal/homicidal ideation: Yes. Issues/concerns per patient self-inventory: No. Other: n/a   New problem(s) identified: No, Describe:  n/a  New Short Term/Long Term Goal(s): detox/medication management for mood stabilization; elimination of SI thoughts; Development of comprehensive mental wellness/sobriety plan.   Discharge Plan or Barriers: CSW assessing. Pt has several upcoming court dates and is not able to immediately pursue inpatient treatment however he was interested in getting resources for 58month or longer treatment options. He plans to return home; follow-up with [facility]/Dr. Omelia Blackwater at discharge.   Reason for Continuation of Hospitalization: Anxiety Depression Medication stabilization Withdrawal symptoms  Estimated Length of Stay: Monday, 01/14/17  Attendees: Patient: 01/09/2017 10:28 AM  Physician: Dr.  Rainville MD; Dr. Jama Flavorsobos MD 01/09/2017 10:28 AM  Nursing: Luanna Coleoni, Jan RN 01/09/2017 10:28 AM  RN Care Manager: Onnie BoerJennifer Clark CM 01/09/2017 10:28 AM  Social Worker: Trula SladeHeather Smart, LCSW 01/09/2017 10:28 AM  Recreational Therapist: x 01/09/2017 10:28 AM  Other: Armandina StammerAgnes Nwoko NP; Reola Calkinsravis Money NP;  01/09/2017 10:28 AM  Other:  01/09/2017 10:28 AM  Other: 01/09/2017 10:28 AM    Scribe for Treatment Team: Ledell PeoplesHeather N Smart, LCSW 01/09/2017 10:28 AM

## 2017-01-09 NOTE — Progress Notes (Signed)
Pt is new to the unit earlier this afternoon.  He reports that he was having a difficult time when he came on the unit, but now that he is settled in, he is doing ok.  He reports that his withdrawal symptoms are mild to moderate at this time.  He denies HI/AVH.  He contracts for safety.  Pt is pleasant and cooperative with Clinical research associate.  He makes his needs known and requested a sleep aid which he was given at bedtime.  Support and encouragement offered.  Discharge plans are in process.  Safety maintained with q15 minute checks.
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
## 2017-01-09 NOTE — Progress Notes (Signed)
Recreation Therapy Notes  Date:  01/09/17  Time: 0930 Location: [address] Dayroom  Group Topic: Stress Management  Goal Area(s) Addresses:  Patient will verbalize importance of using healthy stress management.  Patient will identify positive emotions associated with healthy stress management.   Intervention: Stress Management  Activity :  Meditation.  LRT introduced the stress management technique of meditation.  LRT played a meditation from the Calm app to allow patients to focus on the forgiveness of others.  Patients followed along as LRT played meditation.  Education:  Stress Management, Discharge Planning.   Education Outcome: Acknowledges edcuation/In group clarification offered/Needs additional education  Clinical Observations/Feedback: Pt did not attend group.    Filimon Miranda, LRT/CTRS         Deland Slocumb A 01/09/2017 12:20 PM 

## 2017-01-09 NOTE — [facility] Suicide Risk Assessment (Signed)
[facility] INPATIENT:  Family/Significant Other Suicide Prevention Education  Suicide Prevention Education:  Education Completed; Leone Brand (pts' mother) [phone] has been identified by the patient as the family member/significant other with whom the patient will be residing, and identified as the person(s) who will aid the patient in the event of a mental health crisis (suicidal ideations/suicide attempt).  With written consent from the patient, the family member/significant other has been provided the following suicide prevention education, prior to the and/or following the discharge of the patient.  The suicide prevention education provided includes the following:  Suicide risk factors  Suicide prevention and interventions  National Suicide Hotline telephone number  [facility] assessment telephone number  [facility] Emergency Assistance 911  [facility] and/or Residential Mobile Crisis Unit telephone number  Request made of family/significant other to:  Remove weapons (e.g., guns, rifles, knives), all items previously/currently identified as safety concern.    Remove drugs/medications (over-the-counter, prescriptions, illicit drugs), all items previously/currently identified as a safety concern.  The family member/significant other verbalizes understanding of the suicide prevention education information provided.  The family member/significant other agrees to remove the items of safety concern listed above.  SPE and aftercare reviewed with pt's mother. She has no concerns about patient returning home at discharge. Pt's mother provided his attorney's fax number. "He had a court date yesterday (10/16) and the attorney needs his hospital note faxed." Pt provided consent for this contact. Pt has no access to guns/weapons.   Khy Pitre N Smart LCSW 01/09/2017, 10:35 AM

## 2017-01-09 NOTE — [facility] Group Notes (Signed)
LCSW Group Therapy Note   01/09/2017 1:15pm   Type of Therapy and Topic:  Group Therapy:  Overcoming Obstacles   Participation Level:  Did Not Attend-pt invited. Chose to remain in bed.    Description of Group:    In this group patients will be encouraged to explore what they see as obstacles to their own wellness and recovery. They will be guided to discuss their thoughts, feelings, and behaviors related to these obstacles. The group will process together ways to cope with barriers, with attention given to specific choices patients can make. Each patient will be challenged to identify changes they are motivated to make in order to overcome their obstacles. This group will be process-oriented, with patients participating in exploration of their own experiences as well as giving and receiving support and challenge from other group members.   Therapeutic Goals: 1. Patient will identify personal and current obstacles as they relate to admission. 2. Patient will identify barriers that currently interfere with their wellness or overcoming obstacles.  3. Patient will identify feelings, thought process and behaviors related to these barriers. 4. Patient will identify two changes they are willing to make to overcome these obstacles:      Summary of Patient Progress      Therapeutic Modalities:   Cognitive Behavioral Therapy Solution Focused Therapy Motivational Interviewing Relapse Prevention Therapy  Ledell Peoples Smart, LCSW 01/09/2017 3:01 PM

## 2017-01-09 NOTE — Progress Notes (Signed)
Pt's mother Leone Brand [phone]) called CSW and informed he that pt has been calling her and his girlfriend who lives with pt's mother, requesting that they bring him his heroin and suboxone "so he can sell it." They told him no and his mother states he became angry and upset. She is concerned that he will call his exwife "angela" who also has access to drugs and if she visits, "she will definitely bring him drugs." CSW notified pts' RN/charge RN Roni of above who is requesting that MD place visitation restriction on patient. Also, Pt's mother DID NOT want patient to know about the above conversation due to his mood lability and agitation.   Trula Slade, MSW, LCSW Clinical Social Worker 01/09/2017 4:03 PM

## 2017-01-09 NOTE — Progress Notes (Signed)
Patient has had multiple somatic complaints this evening.  Patient as been agitated and irritable this shift.  It was reported by social work that patient requested family to bring suboxone and heroin on the unit.  Patient was placed on unit restriction and became very angry with staff.   Assess patient for safety, offer medications as prescribed engage patient in 1:1 staff talks.   Continue to monitor as prescribed.

## 2017-01-10 DIAGNOSIS — F1920 Other psychoactive substance dependence, uncomplicated: Secondary | ICD-10-CM

## 2017-01-10 DIAGNOSIS — F39 Unspecified mood [affective] disorder: Secondary | ICD-10-CM

## 2017-01-10 DIAGNOSIS — Z87891 Personal history of nicotine dependence: Secondary | ICD-10-CM

## 2017-01-10 NOTE — Progress Notes (Signed)
DAR NOTE: Pt present with flat affect and depressed mood in the unit. Pt has been isolating himself, stays in the room most of the time. Pt denies physical pain, complained of anxiety, took all his meds as scheduled. As per self inventory, pt had a good night sleep, good appetite, normal energy, and good concentration. Pt rate depression at 0, hopeless ness at 1, and anxiety at 2. Pt's safety ensured with 15 minute and environmental checks. Pt currently denies SI/HI and A/V hallucinations. Pt verbally agrees to seek staff if SI/HI or A/VH occurs and to consult with staff before acting on these thoughts. Will continue POC.

## 2017-01-10 NOTE — Progress Notes (Signed)
[facility]  01/10/2017 4:34 PM Elissa LovettJeremiah D Zarazua  MRN:  [id number]  Subjective: Jared [name] reports, "I don't know how I'm doing. I'm wondering when I getting discharged from here. I got some agenda at home, bill to pay, work for the money to pay bills, see my son, handle legal stuff. I have been doing all that is required of me. I'm taking the medicines. I'm attending every group. I'm more than complying to the rules. Then, why was I stripped of my right to have visitors. There was some stupid information from the outside to here saying that some visitor will be bringing in some contraband (drug stuff) for me to sell here. My mother & my girlfriend are my only visitors here. They do not use or deal drugs. I cannot get any shit done from sitting around here. I'm really sad & depressed because of these nonsense (tearful)".  Objective: Jared [name] is seen, chart reviewed. His case discussed with the treatment team. He is alert, oriented x 4. He presents sad due to the above stated reasons. He denies any SIHI, AVH, delusional thoughts or paranoia. He does not appear to be responding to any internal stimuli. Staff continues to provide support. He is tolerating his treatment regimen.  Principal Problem: <principal problem not specified>  Diagnosis:   Patient Active Problem List   Diagnosis Date Noted  . Polysubstance dependence including opioid type drug, continuous use (HCC) [F11.20, F19.20] 01/08/2017  . Substance induced mood disorder (HCC) [F19.94] 01/08/2017   Total Time spent with patient: 25 minutes  Past Psychiatric History: Polysubstance use disorder.  Past Medical History:  Past Medical History:  Diagnosis Date  . Asthma   . Gastric ulcer   . Hepatitis C   . Hypertension    History reviewed. No pertinent surgical history. Family History: History reviewed. No pertinent family history.  Family Psychiatric  History: See H&P  Social History:  History  Alcohol Use No      History  Drug Use  . Types: IV    Comment: herion    Social History   Social History  . Marital status: Single    Spouse name: N/A  . Number of children: N/A  . Years of education: N/A   Social History Main Topics  . Smoking status: Former Smoker    Types: Cigarettes  . Smokeless tobacco: Never Used  . Alcohol use No  . Drug use: Yes    Types: IV     Comment: herion  . Sexual activity: Not Asked   Other Topics Concern  . None   Social History Narrative  . None   Additional Social History:   Sleep: Good  Appetite:  Fair  Current Medications: Current Facility-Administered Medications  Medication Dose Route Frequency Provider Last Rate Last Dose  . alum & mag hydroxide-simeth (MAALOX/MYLANTA) 200-200-20 MG/5ML suspension 30 mL  30 mL Oral Q4H PRN Laveda AbbeParks, Laurie Britton, NP      . Cariprazine HCl CAPS 1.5 mg  1 capsule Oral BID AC Asahel Risden I, NP   1.5 mg at 01/10/17 [id number]  . cloNIDine (CATAPRES) tablet 0.1 mg  0.1 mg Oral BH-qamhs Laveda AbbeParks, Laurie Britton, NP   0.1 mg at 01/10/17 [id number]   Followed by  . [START ON 01/12/2017] cloNIDine (CATAPRES) tablet 0.1 mg  0.1 mg Oral QAC breakfast Laveda AbbeParks, Laurie Britton, NP      . dicyclomine (BENTYL) tablet 20 mg  20 mg Oral Q6H PRN Laveda AbbeParks, Laurie Britton, NP      .  gabapentin (NEURONTIN) capsule 300 mg  300 mg Oral TID Laveda Abbe, NP   300 mg at 01/10/17 1103  . hydrOXYzine (ATARAX/VISTARIL) tablet 25 mg  25 mg Oral Q6H PRN Laveda Abbe, NP   25 mg at 01/10/17 1103  . loperamide (IMODIUM) capsule 2-4 mg  2-4 mg Oral PRN Laveda Abbe, NP      . LORazepam (ATIVAN) tablet 1 mg  1 mg Oral Q6H PRN Armandina Stammer I, NP      . LORazepam (ATIVAN) tablet 1 mg  1 mg Oral BID Armandina Stammer I, NP   1 mg at 01/10/17 0813   Followed by  . [START ON 01/11/2017] LORazepam (ATIVAN) tablet 1 mg  1 mg Oral Daily Mackenize Delgadillo I, NP      . magnesium hydroxide (MILK OF MAGNESIA) suspension 30 mL  30 mL Oral Daily PRN Laveda Abbe, NP      . methocarbamol (ROBAXIN) tablet 500 mg  500 mg Oral Q8H PRN Laveda Abbe, NP      . multivitamin with minerals tablet 1 tablet  1 tablet Oral Daily Laveda Abbe, NP   1 tablet at 01/10/17 0813  . naproxen (NAPROSYN) tablet 500 mg  500 mg Oral BID PRN Laveda Abbe, NP   500 mg at 01/09/17 0834  . ondansetron (ZOFRAN-ODT) disintegrating tablet 4 mg  4 mg Oral Q6H PRN Laveda Abbe, NP      . pantoprazole (PROTONIX) EC tablet 40 mg  40 mg Oral Daily Laveda Abbe, NP   40 mg at 01/10/17 0813  . thiamine (B-1) injection 100 mg  100 mg Intramuscular Once Averill Winters, Nicole Kindred I, NP      . thiamine (VITAMIN B-1) tablet 100 mg  100 mg Oral Daily Shahil Speegle I, NP   100 mg at 01/10/17 0813  . traZODone (DESYREL) tablet 50 mg  50 mg Oral QHS PRN Laveda Abbe, NP   50 mg at 01/08/17 2111   Lab Results: No results found for this or any previous visit (from the past 48 hour(s)).  Blood Alcohol level:  Lab Results  Component Value Date   ETH <10 01/08/2017   ETH <5 06/05/2014   Metabolic Disorder Labs: No results found for: HGBA1C, MPG No results found for: PROLACTIN No results found for: CHOL, TRIG, HDL, CHOLHDL, VLDL, LDLCALC  Physical Findings: AIMS: Facial and Oral Movements Muscles of Facial Expression: None, normal Lips and Perioral Area: None, normal Jaw: None, normal Tongue: None, normal,Extremity Movements Upper (arms, wrists, hands, fingers): None, normal Lower (legs, knees, ankles, toes): None, normal, Trunk Movements Neck, shoulders, hips: None, normal, Overall Severity Severity of abnormal movements (highest score from questions above): None, normal Incapacitation due to abnormal movements: None, normal Patient's awareness of abnormal movements (rate only patient's report): No Awareness, Dental Status Current problems with teeth and/or dentures?: No Does patient usually wear dentures?: No  CIWA:  CIWA-Ar Total:  3 COWS:  COWS Total Score: 5  Musculoskeletal: Strength & Muscle Tone: within normal limits Gait & Station: normal Patient leans: N/A  Psychiatric Specialty Exam: Physical Exam  ROS  Blood pressure 124/81, pulse 71, temperature 98.7 F (37.1 C), temperature source Oral, resp. rate 16, height 5' 9.69" (1.77 m), weight 84.4 kg (186 lb), SpO2 100 %.Body mass index is 26.93 kg/m.  General Appearance: Disheveled  Eye Contact:  Good  Speech:  Clear and Coherent  Volume:  Normal  Mood:  Anxious and Dysphoric,  sad, crying spells  Affect:  Congruent and Constricted, tearful  Thought Process:  Coherent  Orientation:  Full (Time, Place, and Person)  Thought Content:  Logical  Suicidal Thoughts:  No  Homicidal Thoughts:  No  Memory:  Immediate;   Good Recent;   Good Remote;   Good  Judgement:  Poor  Insight:  Lacking  Psychomotor Activity:  Normal  Concentration:  Concentration: Fair  Recall:  [employer] of Knowledge:  Fair  Language:  Fair  Akathisia:  No  Handed:    AIMS (if indicated):     Assets:  Desire for Improvement Social Support  ADL's:  Intact  Cognition:  WNL  Sleep: 6.5       Treatment Plan Summary: Daily contact with patient to assess and evaluate symptoms and progress in treatment   Will continue today 11/10/16 plan as below except where it is noted.  -Continue the Clonidine detox protocols as recommended.  -Continue the Ativan detox regimen for Benzodiazepine detox.  -Continue Gabapentin 300 mg tid for agitation/substance withdrawal symptoms.  -Continue the Cariprazine 1.5 bid for mood control.  -Continue Trazodone 50 mg prn for sleep.  - Continue 15 minutes observation for safety concerns  - Encouraged to participate in milieu therapy and group therapy  counseling sessions and also work with coping skills  -  Develop treatment plan to decrease risk of relapse upon discharge and to reduce the need for readmission.  -  Psycho-social education  regarding relapse prevention and self care.  - Health care follow up as needed for medical problems.  - Restart home medications where appropriate.  Sanjuana Kava, NP, PMHNP, FNP-BC. 01/10/2017, 4:34 PM

## 2017-01-10 NOTE — Progress Notes (Signed)
Patient ID: Jared [name], male   DOB: 09-24-1980, 36 y.o.   MRN: [id number]  Pt currently presents with a flat affect and guarded behavior. Pt forwards little to writer, remains in bed. Pt states "I'm doing fine." Pt reports good sleep with current medication regimen. Pt maintains that he does not need any nighttime medications. Pt denies wanting to speak to any of his phone calls tonight.  Pt provided with medications per providers orders. Pt's labs and vitals were monitored throughout the night. Pt given a 1:1 about emotional and mental status. Pt supported and encouraged to express concerns and questions. Pt educated on medications.  Pt's safety ensured with 15 minute and environmental checks. Pt currently denies SI/HI and A/V hallucinations. Pt verbally agrees to seek staff if SI/HI or A/VH occurs and to consult with staff before acting on any harmful thoughts. Will continue POC.

## 2017-01-10 NOTE — [facility] Group Notes (Signed)
LCSW Group Therapy Note  01/10/2017 1:15pm  Type of Therapy and Topic:  Group Therapy: Avoiding Self-Sabotaging and Enabling Behaviors  Participation Level:  Did Not Attend-invited. Chose to remain in bed.    Description of Group:   In this group, patients will learn how to identify obstacles, self-sabotaging and enabling behaviors, as well as: what are they, why do we do them and what needs these behaviors meet. Discuss unhealthy relationships and how to have positive healthy boundaries with those that sabotage and enable. Explore aspects of self-sabotage and enabling in yourself and how to limit these self-destructive behaviors in everyday life.   Therapeutic Goals: 1. Patient will identify one obstacle that relates to self-sabotage and enabling behaviors 2. Patient will identify one personal self-sabotaging or enabling behavior they did prior to admission 3. Patient will state a plan to change the above identified behavior 4. Patient will demonstrate ability to communicate their needs through discussion and/or role play.   Summary of Patient Progress:     Therapeutic Modalities:   Cognitive Behavioral Therapy Person-Centered Therapy Motivational Interviewing   Scheryl Sanborn N Smart, LCSW 01/10/2017 11:27 AM  

## 2017-01-10 NOTE — [facility] Group Notes (Signed)

## 2017-01-11 MED ORDER — HYDROXYZINE HCL 25 MG PO TABS: 25.0000 mg | ORAL_TABLET | Freq: Four times a day (QID) | ORAL | 0 refills | Status: DC | PRN

## 2017-01-11 MED ORDER — GABAPENTIN 300 MG PO CAPS: 300.0000 mg | ORAL_CAPSULE | Freq: Three times a day (TID) | ORAL | 0 refills | Status: DC

## 2017-01-11 MED ORDER — ADULT MULTIVITAMIN W/MINERALS CH: 1.0000 | ORAL_TABLET | Freq: Every day | ORAL | Status: DC

## 2017-01-11 MED ORDER — OMEPRAZOLE 40 MG PO CPDR: DELAYED_RELEASE_CAPSULE | ORAL | 0 refills | Status: DC

## 2017-01-11 MED ORDER — TRAZODONE HCL 50 MG PO TABS: 50.0000 mg | ORAL_TABLET | Freq: Every evening | ORAL | 0 refills | Status: DC | PRN

## 2017-01-11 MED ORDER — CARIPRAZINE HCL 1.5 MG PO CAPS: 1.0000 | ORAL_CAPSULE | Freq: Two times a day (BID) | ORAL | 0 refills | Status: DC

## 2017-01-11 NOTE — [facility] Suicide Risk Assessment (Signed)
[facility] Discharge Suicide Risk Assessment   Principal Problem: depression Discharge Diagnoses:  Patient Active Problem List   Diagnosis Date Noted  . Polysubstance dependence including opioid type drug, continuous use (HCC) [F11.20, F19.20] 01/08/2017  . Substance induced mood disorder (HCC) [F19.94] 01/08/2017    Total Time spent with patient: 30 minutes  Musculoskeletal: Strength & Muscle Tone: within normal limits Gait & Station: normal Patient leans: N/A  Psychiatric Specialty Exam: Review of Systems  Constitutional: Negative for chills and fever.  Respiratory: Negative for cough.   Cardiovascular: Negative for chest pain.  Gastrointestinal: Negative for heartburn, nausea and vomiting.  Skin: Negative for rash.    Blood pressure 138/85, pulse 78, temperature 97.9 F (36.6 C), resp. rate 16, height 5' 9.69" (1.77 m), weight 84.4 kg (186 lb), SpO2 100 %.Body mass index is 26.93 kg/m.  General Appearance: Casual  Eye Contact::  Fair  Speech:  Clear and Coherent409  Volume:  Normal  Mood:  Depressed  Affect:  Appropriate, Congruent, Constricted and Tearful  Thought Process:  Coherent and Goal Directed  Orientation:  Full (Time, Place, and Person)  Thought Content:  Logical  Suicidal Thoughts:  No  Homicidal Thoughts:  No  Memory:  Immediate;   Fair Recent;   Fair Remote;   Fair  Judgement:  Fair  Insight:  Fair  Psychomotor Activity:  Negative and Normal  Concentration:  Good  Recall:  Good  Fund of Knowledge:Fair  Language: Fair  Akathisia:  No  Handed:    AIMS (if indicated):     Assets:  Communication Skills Desire for Improvement  Sleep:  Number of Hours: 6.75  Cognition: WNL  ADL's:  Intact   Mental Status Per Nursing Assessment::   On Admission:  NA  Demographic Factors:  Male and Unemployed  Loss Factors: Decrease in vocational status, Legal issues and Financial problems/change in socioeconomic status  Historical Factors: Prior suicide attempts  and Impulsivity  Risk Reduction Factors:   Responsible for children under 68 years of age, Sense of responsibility to family and Positive social support  Continued Clinical Symptoms:  Depression:   Comorbid alcohol abuse/dependence Impulsivity  Cognitive Features That Contribute To Risk:  None    Suicide Risk:  Mild:  Suicidal ideation of limited frequency, intensity, duration, and specificity.  There are no identifiable plans, no associated intent, mild dysphoria and related symptoms, good self-control (both objective and subjective assessment), few other risk factors, and identifiable protective factors, including available and accessible social support.  Follow-up Information    [facility] [place] Follow up on 01/21/2017.   Why:  Hospital follow-up with Dr. Omelia Blackwater on this date at 2:00PM. Thank you.  Contact information: [address] [place] [address] [phone]        [facility] Follow up on 01/21/2017.   Why:  Screening for possible admission on Monday 01/21/17 at 7:45AM. Please bring proof of [facility] residency, clothing, and 30 days worth of medications. Thank you.  Contact information: [address] [place] [address] [phone]          Subjective data: Pt is a 36 y/o M with depression who presents after suicide attempt via heroin overdose. Pt was restarted on home medications and allowed to detox at [facility]. He showed improvement of mood symptoms and insight regarding his outpatient follow up to address substance abuse. Today upon evaluation, he denies SI/HI/AH/VH. He is tearful in regards to feeling guilty about suicide attempt as he feels  a sense of responsibility towards his daughter. He expresses motivation to seek treatment and to resolve his current legal concerns. He feels that a treatment program greater than 90 days would be ideal for him, but he is accepting if he would need to start at a lower level of care. He  is future-oriented [facility]. He is able to articulate a well-developed safety plan [facility] or contacting emergency services should he feel unable to maintain his own safety. He agrees to continue his current treatment regimen outside the hospital until outpatient follow up. He had no further questions, comments, or concerns.    Plan Of Care/Follow-up recommendations:   -Medications: Continue Vraylar 1.5mg  BID - Outpatient follow up as per above Activity:  as tolerated Diet:  normal Tests:  N/A Other:  see above for DC treatment plan  Micheal Likenshristopher T [place] Inthavong, MD 01/11/2017, 10:56 AM

## 2017-01-11 NOTE — Discharge Summary (Signed)
Physician Discharge Summary Note  Patient:  Jared [name] is an 36 y.o., male  MRN:  [id number]  DOB:  [birth year]  Patient phone:  [phone] (home)   Patient address:   [address]   [place] [id number],   Total Time spent with patient: Greater than 30 minutes  Date of Admission:  01/08/2017 Date of Discharge: 01-11-17  Reason for Admission: Heroin overdose.  Principal Problem: Polysubstance dependence including opioid-type drugs.  Discharge Diagnoses: Patient Active Problem List   Diagnosis Date Noted  . Polysubstance dependence including opioid type drug, continuous use (HCC) [F11.20, F19.20] 01/08/2017  . Substance induced mood disorder [facility](HCC) [F19.94] 01/08/2017   Past Psychiatric History: Polysubstance dependence including opioid type drugs  Past Medical History:  Past Medical History:  Diagnosis Date  . Asthma   . Gastric ulcer   . Hepatitis C   . Hypertension    History reviewed. No pertinent surgical history. Family History: History reviewed. No pertinent family history.  Family Psychiatric  History: See H&P  Social History:  History  Alcohol Use No     History  Drug Use  . Types: IV    Comment: herion    Social History   Social History  . Marital status: Single    Spouse name: N/A  . Number of children: N/A  . Years of education: N/A   Social History Main Topics  . Smoking status: Former Smoker    Types: Cigarettes  . Smokeless tobacco: Never Used  . Alcohol use No  . Drug use: Yes    Types: IV     Comment: herion  . Sexual activity: Not Asked   Other Topics Concern  . None   Social History Narrative  . None   Hospital Course: Pt is a 36 y/o M who was admitted on involuntary hold after suicide attempt via overdose of heroin. 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 Pt sought out  supply of heroin, attempted a test dose, and then overdosed, but he was found by family and emergency services were able to administer Narcan. Upon evaluation, pt reports he is depressed and guilty, but he denies SI/HI/AH/VH today. He reports that he is feeling motivated to get help as he has an upcoming court date when he will be required to likely seek substance treatment in December. Of note, nursing staff heard from pt's family that he was asking for heroin to be brought in to him, and as a result, visitor restrictions were placed. Pt was confronted about this an he replied, "Well yeah, of course I'm asking for xanax and stuff to get brought in here, but I didn't mean it." Pt was redirected to focus on his own health during [facility] and to utilize this time for improvement of mood symptoms and detoxification. Pt also gave history of drinking at least 12 beers daily prior to hospitalization. He agrees to be restarted on previous regimen of Vraylar, gabapentin, and ativan.   Jared [name] [facility] with his UDS positive for Amphetamine, opioid & Benzodiazepine. Reports indicated hx of polysubstance abuse/dependence. Also chart review reports indicated pending drug related legal charges. He was admitted for drug detoxification as well as mood stabilization treatments. He presented on admission with substance withdrawal symptoms.  After evaluation of his symptoms, Jared [name] received both  clonidine & Ativan detoxification treatment protocols to re-stabilized his systems of drug intoxications. He was also enrolled in group counseling sessions being offered & held on this unit. He participated & learned coping skills that should help him after discharge to cope better and manage his substance abuse issues to maintain sobriety.  Besides the detoxification protocols, Jared [name] was also medicated & discharged on; Cariprazine 1.5 mg for mood control, Gabapentin 300 mg for agitation, Hydroxyzine  25 mg prn for anxiety & Trazodone 50 mg for insomnia. He presented other health issues that required treatment and or monitoring. He was resumed on his home medications for those health issues. He tolerated his treatment regimen without any significant adverse effects and or reactions reported.    Jared [name] has successfully completed his detoxifcation treatments. He has asked to be discharged to his home with family. He says he is not interested in any long term substance abuse treatments at this time. He says he has a lot of  drug related chargse & a court date on 03-07-17, of which his judgement will be to go to a rehabilitation drug treatment & will use that opportunity to do drug treatment then. And for now, he just has a lot going on, bills to pay, care for his son & legal charges to take care & would need to go home to take care of these things.    He was then recommended for medication management and routine psychiatric care on an outpatient basis as noted below. He is provided with all the necessary information needed to make this appointment without problems. He received a 7 days worth, supply samples of his [facility] discharge medications. Upon discharge, Jared [name] adamantly denies any suicidal, homicidal ideations,delusional thought, auditory, visual hallucinations or substance withdrawal symptoms. He left [facility] with all personal belongs in no apparent distress. Transportation per mother.  Physical Findings: AIMS: Facial and Oral Movements Muscles of Facial Expression: None, normal Lips and Perioral Area: None, normal Jaw: None, normal Tongue: None, normal,Extremity Movements Upper (arms, wrists, hands, fingers): None, normal Lower (legs, knees, ankles, toes): None, normal, Trunk Movements Neck, shoulders, hips: None, normal, Overall Severity Severity of abnormal movements (highest score from questions above): None, normal Incapacitation due to abnormal movements: None, normal Patient's awareness  of abnormal movements (rate only patient's report): No Awareness, Dental Status Current problems with teeth and/or dentures?: No Does patient usually wear dentures?: No  CIWA:  CIWA-Ar Total: 0 COWS:  COWS Total Score: 0  Musculoskeletal: Strength & Muscle Tone: within normal limits Gait & Station: normal Patient leans: N/A  Psychiatric Specialty Exam: Physical Exam  Constitutional: He appears well-developed.  HENT:  Head: Normocephalic.  Eyes: Pupils are equal, round, and reactive to light.  Neck: Normal range of motion.  Cardiovascular: Normal rate.   Respiratory: Effort normal.  GI: Soft.  Genitourinary:  Genitourinary Comments: Deferred  Neurological: He is alert.  Skin: Skin is warm.    Review of Systems  Constitutional: Negative.   HENT: Negative.   Eyes: Negative.   Respiratory: Negative.   Cardiovascular: Negative.   Gastrointestinal: Negative.   Genitourinary: Negative.   Musculoskeletal: Negative.   Skin: Negative.   Neurological: Negative.   Endo/Heme/Allergies: Negative.   Psychiatric/Behavioral: Positive for depression (Stable) and substance abuse (Hx. Polysusbstance dependence). Negative for hallucinations, memory loss and suicidal ideas. The patient has insomnia (Stable). The patient is not nervous/anxious.     Blood pressure 138/85, pulse 78, temperature 97.9 F (36.6 C), resp. rate 16, height 5' 9.69" (1.77  m), weight 84.4 kg (186 lb), SpO2 100 %.Body mass index is 26.93 kg/m.  See Md's SRA   Have you used any form of tobacco in the last 30 days? (Cigarettes, Smokeless Tobacco, Cigars, and/or Pipes): No  Has this patient used any form of tobacco in the last 30 days? (Cigarettes, Smokeless Tobacco, Cigars, and/or Pipes): N/A  Blood Alcohol level:  Lab Results  Component Value Date   ETH <10 01/08/2017   ETH <5 06/05/2014   Metabolic Disorder Labs:  No results found for: HGBA1C, MPG No results found for: PROLACTIN No results found for: CHOL,  TRIG, HDL, CHOLHDL, VLDL, LDLCALC  See Psychiatric Specialty Exam and Suicide Risk Assessment completed by Attending Physician prior to discharge.  Discharge destination:  Home  Is patient on multiple antipsychotic therapies at discharge:  No   Has Patient had three or more failed trials of antipsychotic monotherapy by history:  No  Recommended Plan for Multiple Antipsychotic Therapies: NA  Allergies as of 01/11/2017      Reactions   Tylenol [acetaminophen] Rash      Medication List    STOP taking these medications   alprazolam 2 MG tablet Commonly known as:  XANAX   cloNIDine 0.1 MG tablet Commonly known as:  CATAPRES     TAKE these medications     Indication  Cariprazine HCl 1.5 MG Caps Commonly known as:  VRAYLAR Take 1 capsule (1.5 mg total) by mouth 2 (two) times daily before a meal. For mood control What changed:  when to take this  reasons to take this  additional instructions  Indication:  Mood control   gabapentin 300 MG capsule Commonly known as:  NEURONTIN Take 1 capsule (300 mg total) by mouth 3 (three) times daily. For agitation  Indication:  Aggressive Behavior, Agitation, Crystam Meth   hydrOXYzine 25 MG tablet Commonly known as:  ATARAX/VISTARIL Take 1 tablet (25 mg total) by mouth every 6 (six) hours as needed for anxiety.  Indication:  Feeling Anxious   multivitamin with minerals Tabs tablet Take 1 tablet by mouth daily. For low vitamin  Indication:  Vitamin supplement   omeprazole 40 MG capsule Commonly known as:  PRILOSEC TAKE ONE CAPSULE BY MOUTH EVERY MORNING daily: For acid reflux  Indication:  Gastroesophageal Reflux Disease   traZODone 50 MG tablet Commonly known as:  DESYREL Take 1 tablet (50 mg total) by mouth at bedtime as needed for sleep.  Indication:  Trouble Sleeping      Follow-up Information    [facility], [place] Follow up on 01/21/2017.   Why:  Hospital follow-up with Dr. Omelia Blackwater on this date at  2:00PM. Thank you.  Contact information: [address] [place] [address] [phone]        [facility] Follow up on 01/21/2017.   Why:  Screening for possible admission on Monday 01/21/17 at 7:45AM. Please bring proof of [facility] residency, clothing, and 30 days worth of medications. Thank you.  Contact information: [address] [place] [address] [phone]          Follow-up recommendations: Activity:  As tolerated Diet: As recommended by your primary care doctor. Keep all scheduled follow-up appointments as recommended.    Comments: Patient is instructed prior to discharge to: Take all medications as prescribed by his/her mental healthcare provider. Report any adverse effects and or reactions from the medicines to his/her outpatient provider promptly. Patient has been instructed & cautioned: To not engage in  alcohol and or illegal drug use while on prescription medicines. In the event of worsening symptoms, patient is instructed to call the crisis hotline, 911 and or go to the nearest ED for appropriate evaluation and treatment of symptoms. To follow-up with his/her primary care provider for your other medical issues, concerns and or health care needs.   Signed: Sanjuana Kava, NP, PMHNP, FNP-BC 01/11/2017, 10:39 AM   Patient seen, Suicide Assessment Completed.  Disposition Plan Reviewed   Pt is a 36 y/o M with depression who presents after suicide attempt via heroin overdose. Pt was restarted on home medications and allowed to detox at [facility]. He showed improvement of mood symptoms and insight regarding his outpatient follow up to address substance abuse. Today upon evaluation, he denies SI/HI/AH/VH. He is tearful in regards to feeling guilty about suicide attempt as he feels a sense of responsibility towards his daughter. He expresses motivation to seek treatment and to resolve his current legal concerns. He feels that a treatment program  greater than 90 days would be ideal for him, but he is accepting if he would need to start at a lower level of care. He is future-oriented regarding follow up at [facility] next week. He is able to articulate a well-developed safety plan including returning to [facility] or contacting emergency services should he feel unable to maintain his own safety. He agrees to continue his current treatment regimen outside the hospital until outpatient follow up. He had no further questions, comments, or concerns.    Plan Of Care/Follow-up recommendations:   -Medications: Continue Vraylar 1.5mg  BID - Outpatient follow up as per above Activity:  as tolerated Diet:  normal Tests:  N/A Other:  see above for DC treatment plan  Micheal Likens, MD

## 2017-01-11 NOTE — Progress Notes (Signed)
  [facility] Adult Case Management Discharge Plan :  Will you be returning to the same living situation after discharge:  Yes,  home with his mother. At discharge, do you have transportation home?: Yes,  his mom will coming around 1pm Do you have the ability to pay for your medications: Yes,  mental health  Release of information consent forms completed and submitted to medical records by CSW.  Patient to Follow up at: Follow-up Information    [facility], [place] Follow up on 01/21/2017.   Why:  Hospital follow-up with Dr. Omelia Blackwater on this date at 2:00PM. Thank you.  Contact information: [address] [place] [address] [phone]        [facility] Follow up on 01/21/2017.   Why:  Screening for possible admission on Monday 01/21/17 at 7:45AM. Please bring proof of [facility] residency, clothing, and 30 days worth of medications. Thank you.  Contact information: [address] [place] [address] [phone]           Next level of care provider has access to [facility] Link:no  Safety Planning and Suicide Prevention discussed: Yes,  SPE completed with pt's mother. SPI pamphlet and mobile crisis information provided to pt.   Have you used any form of tobacco in the last 30 days? (Cigarettes, Smokeless Tobacco, Cigars, and/or Pipes): No  Has patient been referred to the Quitline?: N/A patient is not a smoker  Patient has been referred for addiction treatment: Yes  [employer], LCSW 01/11/2017, 11:01 AM

## 2017-01-11 NOTE — Progress Notes (Signed)
Patient verbalizes readiness for discharge. Follow up plan explained, AVS, transition record and SRA given along with prescriptions. Sample meds provided. All belongings returned. Patient verbalizes understanding. Denies SI/HI and assures this Clinical research associate he will seek assistance should that change. Patient discharged ambulatory and in stable condition.

## 2017-01-11 NOTE — Progress Notes (Signed)
D: Patient observed initially resting in bed however promptly came up for AM meds. Patient verbalizes to this Clinical research associate he is doing "fine" and has no complaints. Appears to minimize as patient's affect is flat, mood depressed. Per self inventory and discussions with writer, rates depression, hopelessness and anxiety all at a 0/10. Rates sleep as good, appetite as good, energy as high and concentration as good.  States goal for today is to "keep aiming high.' Denies pain, physical problems.   A: Medicated per orders, no prns required or requested. Level III obs in place for safety. Emotional support offered and self inventory reviewed. Encouraged completion of Suicide Safety Plan and programming participation. Discussed POC with MD, SW.  Fall prevention plan in place and reviewed with patient as pt is a high fall risk due to unsteady gait at times.   R: Patient verbalizes understanding of POC, falls prevention education. Patient denies SI/HI/AVH and remains safe on level III obs. Will continue to monitor closely and make verbal contact frequently.

## 2017-01-11 NOTE — Plan of Care (Signed)
Problem: Education: Goal: Verbalization of understanding the information provided will improve Outcome: Progressing Patient verbalizes understanding of information, education provided.  Problem: Safety: Goal: Periods of time without injury will increase Outcome: Completed/Met Date Met: 01/11/17 Patient has not engaged in self harm, denies thoughts to do so.

## 2017-01-11 NOTE — Progress Notes (Signed)
Recreation Therapy Notes  Date:  01/11/17  Time: 1000 Location: 500 Hall Dayroom  Group Topic: Stress Management  Goal Area(s) Addresses:  Patient will verbalize importance of using healthy stress management.  Patient will identify positive emotions associated with healthy stress management.   Intervention: Stress Management  Activity :  Guided Imagery.  LRT introduced the stress management technique of guided imagery.  Patients were to listen and follow along as LRT read script for patients to embark on [name] mental vacation.  Education:  Stress Management, Discharge Planning.   Education Outcome: Acknowledges edcuation/In group clarification offered/Needs additional education  Clinical Observations/Feedback: Pt did not attend group.    Jared [name], LRT/CTRS         Jared [name] 01/11/2017 11:52 AM 

## 2022-03-16 ENCOUNTER — Other Ambulatory Visit: Payer: Self-pay

## 2022-03-16 ENCOUNTER — Inpatient Hospital Stay (HOSPITAL_COMMUNITY): Payer: BC Managed Care – PPO

## 2022-03-16 ENCOUNTER — Emergency Department (HOSPITAL_COMMUNITY): Payer: BC Managed Care – PPO

## 2022-03-16 ENCOUNTER — Inpatient Hospital Stay (HOSPITAL_COMMUNITY)
Admission: EM | Admit: 2022-03-16 | Discharge: 2022-03-25 | DRG: 917 | Payer: BC Managed Care – PPO | Attending: Internal Medicine | Admitting: Internal Medicine

## 2022-03-16 DIAGNOSIS — E871 Hypo-osmolality and hyponatremia: Secondary | ICD-10-CM | POA: Diagnosis not present

## 2022-03-16 DIAGNOSIS — I5021 Acute systolic (congestive) heart failure: Secondary | ICD-10-CM | POA: Diagnosis present

## 2022-03-16 DIAGNOSIS — E878 Other disorders of electrolyte and fluid balance, not elsewhere classified: Secondary | ICD-10-CM | POA: Insufficient documentation

## 2022-03-16 DIAGNOSIS — F1123 Opioid dependence with withdrawal: Secondary | ICD-10-CM | POA: Diagnosis not present

## 2022-03-16 DIAGNOSIS — F39 Unspecified mood [affective] disorder: Secondary | ICD-10-CM | POA: Diagnosis present

## 2022-03-16 DIAGNOSIS — J45909 Unspecified asthma, uncomplicated: Secondary | ICD-10-CM | POA: Diagnosis present

## 2022-03-16 DIAGNOSIS — R6521 Severe sepsis with septic shock: Secondary | ICD-10-CM | POA: Diagnosis present

## 2022-03-16 DIAGNOSIS — N179 Acute kidney failure, unspecified: Secondary | ICD-10-CM | POA: Diagnosis not present

## 2022-03-16 DIAGNOSIS — J80 Acute respiratory distress syndrome: Secondary | ICD-10-CM | POA: Diagnosis present

## 2022-03-16 DIAGNOSIS — T424X2A Poisoning by benzodiazepines, intentional self-harm, initial encounter: Secondary | ICD-10-CM | POA: Diagnosis present

## 2022-03-16 DIAGNOSIS — R0603 Acute respiratory distress: Secondary | ICD-10-CM | POA: Diagnosis not present

## 2022-03-16 DIAGNOSIS — Z91148 Patient's other noncompliance with medication regimen for other reason: Secondary | ICD-10-CM

## 2022-03-16 DIAGNOSIS — F05 Delirium due to known physiological condition: Secondary | ICD-10-CM | POA: Diagnosis not present

## 2022-03-16 DIAGNOSIS — E872 Acidosis, unspecified: Secondary | ICD-10-CM | POA: Diagnosis present

## 2022-03-16 DIAGNOSIS — A419 Sepsis, unspecified organism: Secondary | ICD-10-CM | POA: Diagnosis present

## 2022-03-16 DIAGNOSIS — J690 Pneumonitis due to inhalation of food and vomit: Secondary | ICD-10-CM | POA: Insufficient documentation

## 2022-03-16 DIAGNOSIS — T40601A Poisoning by unspecified narcotics, accidental (unintentional), initial encounter: Secondary | ICD-10-CM

## 2022-03-16 DIAGNOSIS — I110 Hypertensive heart disease with heart failure: Secondary | ICD-10-CM | POA: Diagnosis present

## 2022-03-16 DIAGNOSIS — J121 Respiratory syncytial virus pneumonia: Secondary | ICD-10-CM | POA: Diagnosis present

## 2022-03-16 DIAGNOSIS — I428 Other cardiomyopathies: Secondary | ICD-10-CM | POA: Diagnosis present

## 2022-03-16 DIAGNOSIS — I2489 Other forms of acute ischemic heart disease: Secondary | ICD-10-CM | POA: Diagnosis present

## 2022-03-16 DIAGNOSIS — Z79899 Other long term (current) drug therapy: Secondary | ICD-10-CM

## 2022-03-16 DIAGNOSIS — Z1152 Encounter for screening for COVID-19: Secondary | ICD-10-CM | POA: Diagnosis not present

## 2022-03-16 DIAGNOSIS — R0602 Shortness of breath: Secondary | ICD-10-CM | POA: Diagnosis present

## 2022-03-16 DIAGNOSIS — T1491XA Suicide attempt, initial encounter: Secondary | ICD-10-CM | POA: Diagnosis not present

## 2022-03-16 DIAGNOSIS — F1120 Opioid dependence, uncomplicated: Secondary | ICD-10-CM | POA: Diagnosis not present

## 2022-03-16 DIAGNOSIS — F314 Bipolar disorder, current episode depressed, severe, without psychotic features: Secondary | ICD-10-CM | POA: Diagnosis not present

## 2022-03-16 DIAGNOSIS — G9341 Metabolic encephalopathy: Secondary | ICD-10-CM | POA: Diagnosis present

## 2022-03-16 DIAGNOSIS — F1920 Other psychoactive substance dependence, uncomplicated: Secondary | ICD-10-CM | POA: Diagnosis not present

## 2022-03-16 DIAGNOSIS — Z8711 Personal history of peptic ulcer disease: Secondary | ICD-10-CM

## 2022-03-16 DIAGNOSIS — Z87891 Personal history of nicotine dependence: Secondary | ICD-10-CM

## 2022-03-16 DIAGNOSIS — F10939 Alcohol use, unspecified with withdrawal, unspecified: Secondary | ICD-10-CM | POA: Diagnosis present

## 2022-03-16 DIAGNOSIS — T401X2D Poisoning by heroin, intentional self-harm, subsequent encounter: Secondary | ICD-10-CM | POA: Diagnosis not present

## 2022-03-16 DIAGNOSIS — F1124 Opioid dependence with opioid-induced mood disorder: Secondary | ICD-10-CM | POA: Diagnosis not present

## 2022-03-16 DIAGNOSIS — T401X2A Poisoning by heroin, intentional self-harm, initial encounter: Secondary | ICD-10-CM | POA: Insufficient documentation

## 2022-03-16 DIAGNOSIS — T40604A Poisoning by unspecified narcotics, undetermined, initial encounter: Secondary | ICD-10-CM | POA: Diagnosis not present

## 2022-03-16 DIAGNOSIS — B1920 Unspecified viral hepatitis C without hepatic coma: Secondary | ICD-10-CM | POA: Diagnosis present

## 2022-03-16 DIAGNOSIS — I5043 Acute on chronic combined systolic (congestive) and diastolic (congestive) heart failure: Secondary | ICD-10-CM | POA: Diagnosis not present

## 2022-03-16 DIAGNOSIS — T510X2A Toxic effect of ethanol, intentional self-harm, initial encounter: Secondary | ICD-10-CM | POA: Diagnosis present

## 2022-03-16 DIAGNOSIS — M6282 Rhabdomyolysis: Secondary | ICD-10-CM | POA: Diagnosis present

## 2022-03-16 DIAGNOSIS — J9601 Acute respiratory failure with hypoxia: Secondary | ICD-10-CM | POA: Insufficient documentation

## 2022-03-16 DIAGNOSIS — Z781 Physical restraint status: Secondary | ICD-10-CM

## 2022-03-16 DIAGNOSIS — Z886 Allergy status to analgesic agent status: Secondary | ICD-10-CM

## 2022-03-16 DIAGNOSIS — E876 Hypokalemia: Secondary | ICD-10-CM | POA: Diagnosis not present

## 2022-03-16 DIAGNOSIS — Z8249 Family history of ischemic heart disease and other diseases of the circulatory system: Secondary | ICD-10-CM

## 2022-03-16 LAB — CBC WITH DIFFERENTIAL/PLATELET
Abs Immature Granulocytes: 0.03 10*3/uL (ref 0.00–0.07)
Basophils Absolute: 0 10*3/uL (ref 0.0–0.1)
Basophils Relative: 0 %
Eosinophils Absolute: 0 10*3/uL (ref 0.0–0.5)
Eosinophils Relative: 0 %
HCT: 52.1 % — ABNORMAL HIGH (ref 39.0–52.0)
Hemoglobin: 17.5 g/dL — ABNORMAL HIGH (ref 13.0–17.0)
Immature Granulocytes: 0 %
Lymphocytes Relative: 6 %
Lymphs Abs: 0.7 10*3/uL (ref 0.7–4.0)
MCH: 31.7 pg (ref 26.0–34.0)
MCHC: 33.6 g/dL (ref 30.0–36.0)
MCV: 94.4 fL (ref 80.0–100.0)
Monocytes Absolute: 0.6 10*3/uL (ref 0.1–1.0)
Monocytes Relative: 6 %
Neutro Abs: 9.8 10*3/uL — ABNORMAL HIGH (ref 1.7–7.7)
Neutrophils Relative %: 88 %
Platelets: 261 10*3/uL (ref 150–400)
RBC: 5.52 MIL/uL (ref 4.22–5.81)
RDW: 12.6 % (ref 11.5–15.5)
WBC: 11.2 10*3/uL — ABNORMAL HIGH (ref 4.0–10.5)
nRBC: 0 % (ref 0.0–0.2)

## 2022-03-16 LAB — I-STAT VENOUS BLOOD GAS, ED
Acid-Base Excess: 1 mmol/L (ref 0.0–2.0)
Bicarbonate: 28.7 mmol/L — ABNORMAL HIGH (ref 20.0–28.0)
Calcium, Ion: 1.13 mmol/L — ABNORMAL LOW (ref 1.15–1.40)
HCT: 52 % (ref 39.0–52.0)
Hemoglobin: 17.7 g/dL — ABNORMAL HIGH (ref 13.0–17.0)
O2 Saturation: 93 %
Potassium: 4.3 mmol/L (ref 3.5–5.1)
Sodium: 137 mmol/L (ref 135–145)
TCO2: 30 mmol/L (ref 22–32)
pCO2, Ven: 57.3 mmHg (ref 44–60)
pH, Ven: 7.308 (ref 7.25–7.43)
pO2, Ven: 76 mmHg — ABNORMAL HIGH (ref 32–45)

## 2022-03-16 LAB — COMPREHENSIVE METABOLIC PANEL
ALT: 32 U/L (ref 0–44)
AST: 37 U/L (ref 15–41)
Albumin: 3.3 g/dL — ABNORMAL LOW (ref 3.5–5.0)
Alkaline Phosphatase: 71 U/L (ref 38–126)
Anion gap: 8 (ref 5–15)
BUN: 20 mg/dL (ref 6–20)
CO2: 25 mmol/L (ref 22–32)
Calcium: 8.5 mg/dL — ABNORMAL LOW (ref 8.9–10.3)
Chloride: 102 mmol/L (ref 98–111)
Creatinine, Ser: 1.14 mg/dL (ref 0.61–1.24)
GFR, Estimated: >60 mL/min (ref >60)
Glucose, Bld: 116 mg/dL — ABNORMAL HIGH (ref 70–99)
Potassium: 4.3 mmol/L (ref 3.5–5.1)
Sodium: 135 mmol/L (ref 135–145)
Total Bilirubin: 0.8 mg/dL (ref 0.3–1.2)
Total Protein: 6.3 g/dL — ABNORMAL LOW (ref 6.5–8.1)

## 2022-03-16 LAB — ACETAMINOPHEN LEVEL: Acetaminophen (Tylenol), Serum: <10 ug/mL — ABNORMAL LOW (ref 10–30)

## 2022-03-16 LAB — TROPONIN I (HIGH SENSITIVITY)
Troponin I (High Sensitivity): 307 ng/L (ref <18)
Troponin I (High Sensitivity): 387 ng/L (ref <18)

## 2022-03-16 LAB — ECHOCARDIOGRAM COMPLETE
Area-P 1/2: 4.68 cm2
Height: 69 in
S' Lateral: 4.2 cm
Single Plane A2C EF: 21.8 %
Weight: 2962.98 oz

## 2022-03-16 LAB — APTT: aPTT: 27 seconds (ref 24–36)

## 2022-03-16 LAB — LACTIC ACID, PLASMA
Lactic Acid, Venous: 2.4 mmol/L (ref 0.5–1.9)
Lactic Acid, Venous: 4.9 mmol/L (ref 0.5–1.9)

## 2022-03-16 LAB — PROCALCITONIN: Procalcitonin: 28.84 ng/mL

## 2022-03-16 LAB — HIV ANTIBODY (ROUTINE TESTING W REFLEX): HIV Screen 4th Generation wRfx: NONREACTIVE

## 2022-03-16 LAB — GLUCOSE, CAPILLARY: Glucose-Capillary: 145 mg/dL — ABNORMAL HIGH (ref 70–99)

## 2022-03-16 LAB — PROTIME-INR
INR: 1 (ref 0.8–1.2)
Prothrombin Time: 13.1 seconds (ref 11.4–15.2)

## 2022-03-16 LAB — SALICYLATE LEVEL: Salicylate Lvl: <7 mg/dL — ABNORMAL LOW (ref 7.0–30.0)

## 2022-03-16 LAB — CK: Total CK: 266 U/L (ref 49–397)

## 2022-03-16 LAB — MRSA NEXT GEN BY PCR, NASAL: MRSA by PCR Next Gen: DETECTED — AB

## 2022-03-16 LAB — MAGNESIUM: Magnesium: 1.6 mg/dL — ABNORMAL LOW (ref 1.7–2.4)

## 2022-03-16 LAB — CBG MONITORING, ED: Glucose-Capillary: 118 mg/dL — ABNORMAL HIGH (ref 70–99)

## 2022-03-16 LAB — ETHANOL: Alcohol, Ethyl (B): <10 mg/dL (ref <10)

## 2022-03-16 MED ORDER — LACTATED RINGERS IV SOLN: INTRAVENOUS | Status: DC

## 2022-03-16 MED ORDER — ORAL CARE MOUTH RINSE: 15.0000 mL | OROMUCOSAL | Status: DC | PRN

## 2022-03-16 MED ORDER — MUPIROCIN 2 % EX OINT
1.0000 | TOPICAL_OINTMENT | Freq: Two times a day (BID) | CUTANEOUS | Status: AC
  Administered 2022-03-17 – 2022-03-21 (×9): 1 via NASAL
  Filled 2022-03-16 (×4): qty 22

## 2022-03-16 MED ORDER — NOREPINEPHRINE 4 MG/250ML-% IV SOLN
2.0000 ug/min | INTRAVENOUS | Status: DC
  Administered 2022-03-16: 2 ug/min via INTRAVENOUS
  Filled 2022-03-16: qty 250

## 2022-03-16 MED ORDER — DOCUSATE SODIUM 100 MG PO CAPS: 100.0000 mg | ORAL_CAPSULE | Freq: Two times a day (BID) | ORAL | Status: DC | PRN

## 2022-03-16 MED ORDER — MORPHINE SULFATE (PF) 4 MG/ML IV SOLN
4.0000 mg | Freq: Once | INTRAVENOUS | Status: AC
  Administered 2022-03-16: 2 mg via INTRAVENOUS
  Filled 2022-03-16: qty 1

## 2022-03-16 MED ORDER — CHLORHEXIDINE GLUCONATE CLOTH 2 % EX PADS
6.0000 | MEDICATED_PAD | Freq: Every day | CUTANEOUS | Status: DC
  Administered 2022-03-16 – 2022-03-17 (×2): 6 via TOPICAL

## 2022-03-16 MED ORDER — VANCOMYCIN HCL 1750 MG/350ML IV SOLN
1750.0000 mg | Freq: Once | INTRAVENOUS | Status: AC
  Administered 2022-03-16: 1750 mg via INTRAVENOUS
  Filled 2022-03-16: qty 350

## 2022-03-16 MED ORDER — NOREPINEPHRINE 4 MG/250ML-% IV SOLN: 2.0000 ug/min | INTRAVENOUS | Status: DC

## 2022-03-16 MED ORDER — ORAL CARE MOUTH RINSE
15.0000 mL | OROMUCOSAL | Status: DC
  Administered 2022-03-16 – 2022-03-17 (×4): 15 mL via OROMUCOSAL

## 2022-03-16 MED ORDER — SODIUM CHLORIDE 0.9 % IV SOLN
250.0000 mL | INTRAVENOUS | Status: DC
  Administered 2022-03-19 – 2022-03-23 (×4): 250 mL via INTRAVENOUS

## 2022-03-16 MED ORDER — ENOXAPARIN SODIUM 40 MG/0.4ML IJ SOSY: 40.0000 mg | PREFILLED_SYRINGE | INTRAMUSCULAR | Status: DC

## 2022-03-16 MED ORDER — LORAZEPAM 2 MG/ML IJ SOLN
1.0000 mg | INTRAMUSCULAR | Status: DC | PRN
  Administered 2022-03-16 – 2022-03-19 (×6): 1 mg via INTRAVENOUS
  Filled 2022-03-16 (×7): qty 1

## 2022-03-16 MED ORDER — LACTATED RINGERS IV BOLUS
1000.0000 mL | Freq: Once | INTRAVENOUS | Status: AC
  Administered 2022-03-16: 1000 mL via INTRAVENOUS

## 2022-03-16 MED ORDER — SODIUM CHLORIDE 0.9 % IV SOLN: 250.0000 mL | INTRAVENOUS | Status: DC

## 2022-03-16 MED ORDER — SODIUM CHLORIDE 0.9 % IV SOLN
2.0000 g | Freq: Once | INTRAVENOUS | Status: AC
  Administered 2022-03-16: 2 g via INTRAVENOUS
  Filled 2022-03-16: qty 20

## 2022-03-16 MED ORDER — POLYETHYLENE GLYCOL 3350 17 G PO PACK
17.0000 g | PACK | Freq: Every day | ORAL | Status: DC | PRN
  Filled 2022-03-16: qty 1

## 2022-03-16 MED ORDER — PHENOBARBITAL SODIUM 130 MG/ML IJ SOLN
65.0000 mg | Freq: Three times a day (TID) | INTRAMUSCULAR | Status: AC
  Administered 2022-03-18 – 2022-03-20 (×6): 65 mg via INTRAVENOUS
  Filled 2022-03-16 (×6): qty 1

## 2022-03-16 MED ORDER — PHENOBARBITAL SODIUM 130 MG/ML IJ SOLN
97.5000 mg | Freq: Three times a day (TID) | INTRAMUSCULAR | Status: AC
  Administered 2022-03-16 – 2022-03-18 (×6): 97.5 mg via INTRAVENOUS
  Filled 2022-03-16 (×6): qty 1

## 2022-03-16 MED ORDER — ONDANSETRON HCL 4 MG/2ML IJ SOLN
4.0000 mg | Freq: Four times a day (QID) | INTRAMUSCULAR | Status: DC | PRN
  Administered 2022-03-18: 4 mg via INTRAVENOUS
  Filled 2022-03-16: qty 2

## 2022-03-16 MED ORDER — PHENOBARBITAL SODIUM 65 MG/ML IJ SOLN
32.5000 mg | Freq: Three times a day (TID) | INTRAMUSCULAR | Status: AC
  Administered 2022-03-20 – 2022-03-22 (×6): 32.5 mg via INTRAVENOUS
  Filled 2022-03-16 (×6): qty 1

## 2022-03-16 MED ORDER — PANTOPRAZOLE SODIUM 40 MG IV SOLR
40.0000 mg | Freq: Every day | INTRAVENOUS | Status: DC
  Administered 2022-03-16 – 2022-03-17 (×2): 40 mg via INTRAVENOUS
  Filled 2022-03-16 (×2): qty 10

## 2022-03-16 MED ORDER — SODIUM CHLORIDE 0.9 % IV SOLN
3.0000 g | Freq: Four times a day (QID) | INTRAVENOUS | Status: DC
  Administered 2022-03-17 – 2022-03-19 (×10): 3 g via INTRAVENOUS
  Filled 2022-03-16 (×10): qty 8

## 2022-03-16 MED ORDER — VANCOMYCIN HCL 1250 MG/250ML IV SOLN
1250.0000 mg | Freq: Two times a day (BID) | INTRAVENOUS | Status: DC
  Administered 2022-03-17 – 2022-03-18 (×3): 1250 mg via INTRAVENOUS
  Filled 2022-03-16 (×5): qty 250

## 2022-03-16 MED ORDER — ALBUTEROL SULFATE (2.5 MG/3ML) 0.083% IN NEBU: 2.5000 mg | INHALATION_SOLUTION | RESPIRATORY_TRACT | Status: DC | PRN

## 2022-03-16 MED ORDER — KETOROLAC TROMETHAMINE 15 MG/ML IJ SOLN
15.0000 mg | Freq: Once | INTRAMUSCULAR | Status: AC
  Administered 2022-03-16: 15 mg via INTRAVENOUS
  Filled 2022-03-16: qty 1

## 2022-03-16 MED ORDER — HYDROCORTISONE SOD SUC (PF) 100 MG IJ SOLR
50.0000 mg | Freq: Four times a day (QID) | INTRAMUSCULAR | Status: DC
  Administered 2022-03-16 – 2022-03-17 (×4): 50 mg via INTRAVENOUS
  Filled 2022-03-16 (×4): qty 2

## 2022-03-16 MED ORDER — NOREPINEPHRINE BITARTRATE 1 MG/ML IV SOLN: 0.0500 ug/kg/min | INTRAVENOUS | Status: DC

## 2022-03-16 MED ORDER — SODIUM CHLORIDE 0.9 % IV SOLN
500.0000 mg | INTRAVENOUS | Status: DC
  Administered 2022-03-16: 500 mg via INTRAVENOUS
  Filled 2022-03-16: qty 5

## 2022-03-16 NOTE — H&P (Signed)
NAME:  Jared [name], MRN:  [id number], DOB:  [birth year], LOS: 0 ADMISSION DATE:  03/16/2022, CONSULTATION DATE:  03/16/2022 REFERRING MD:  Jearld Fenton, MD, CHIEF COMPLAINT:  Septic Shock   History of Present Illness:  Jared [name]. Jared [name] is a 41 y.o. male with a PMH HTN, Asthma, Hep C,  polysubstance dependence including opioid type drugs, substance induced mood disorder, and hx of prior overdose, He presented to the ED by EMS today after pt mother found him down (unknown downtime) next to Heroin, Xanax, and Wine. EMS gave Narcan with positive response, and arrived to the ED with Spo2 in the 70's, placed on NRB. Pt was alert to stimuli and had vomit on his shirt. Pt respiratory status continued to decompensate leading to needing Heated HFNC to maintain Spo2 >90%. Pt also became hypotensive (71/55), requiring NE post his 3L fluid boluses.   ED workup reveled WBC 11.2, Trop 307, LA 2.4, Blood Cultures Pending, CXR showed diffuse alveolar densities seen in both lungs, more so in right lower lung field suggesting multifocal PNA. He presented with a rectal temp 101.30F.   PCCM consulted in the setting of Septic Shock.   Pertinent  Medical History  polysubstance dependence including opioid type drugs, substance induced mood disorder, and hx of prior overdose, asthma, Hep C, HTN  Significant Hospital Events: Including procedures, antibiotic start and stop dates in addition to other pertinent events   12/22 - ED > 3L Fluid received Azithromycin/Ceftriaxone, Vanco, HHFNC, NE started > Admitted to ICU  Interim History / Subjective:  Pt is sitting in bed, alert and able to communicate effectively at this time. He admits to using heroin today, and has so in the past. He states that he not in any pain at this time and feel that he is able to breathe fine. He is understanding that he has PNA and requiring high amounts of oxygen to keep him stable. Conversation was had at bedside that if things do not progress,  he may need intubation and central access. He agrees to this, but asking for [place] to call his mother.   Objective   Blood pressure (!) 118/92, pulse 87, temperature (!) 101.3 F (38.5 C), temperature source Rectal, resp. rate 17, height 5\' 9"  (1.753 m), weight 84 kg, SpO2 98 %.    FiO2 (%):  [100 %] 100 %  No intake or output data in the 24 hours ending 03/16/22 1603 Filed Weights   03/16/22 1219  Weight: 84 kg   Physical Examination: General: Acutely ill-appearing male in NAD. On HHFNC HEENT: [place]/AT, anicteric sclera, PERRL, moist mucous membranes. Neuro: Awake, oriented x 4. Responds to verbal stimuli. Following commands consistently. Moves all 4 extremities spontaneously.  CV: Tachycardic, no m/g/r. PULM: Breathing even and unlabored on HHFNC 40L . Lung fields Coarse/Diminished. GI: Soft, nontender, nondistended. Normoactive bowel sounds. Extremities: No LE edema noted. Skin:  Cool/Dry. Resolved Hospital Problem list     Assessment & Plan:  Septic Shock w/ Lactic Acidosis> likely secondary to aspiration PNA d/t overdose with unknown downtime Plan - Start PIV NE  - NE titrated to goal MAP with Goal >65 - Starting Stress Dose Steroids (12/22) - Trend WBC, fever curve - F/u Cx data - F/u Procal - Trend LA 2.4  - Continue Vancomycin (12/22), Off Azithromycin/Ceftriaxone  - Start Unasyn  (12/22)  Acute Respiratory Failure> in the setting of asp. PNA post overdose, requiring HHFNC  Plan - Continue Heated HFNC  - Closely monitor Resp Status -  Spo2 Goal >92% - High Risk for Intubation  - Pulmonary Hygiene   Hx of Asthma Plan - PRN SABA  Elevated Troponin> likely demand ischemia but also risk for endocarditis given IV drug abuse hx  Plan - Trend Trops - ECHO  Possible Withdrawal > drinks a box of wine per week, last drink 12/21 Plan - Adding Phenobarbital    Mood Disorder with Intentional Overdose Plan  - Holding home meds in acute shock setting, will add as  appropriate  - Will need to consult Psych once shock resolves  Best Practice (right click and "Reselect all SmartList Selections" daily)   Diet/type: NPO DVT prophylaxis: LMWH GI prophylaxis: PPI Lines: N/A Foley:  N/A Code Status:  full code Last date of multidisciplinary goals of care discussion [03/16/2022] - See separate note by Zenia Resides on 03/16/2022  Labs   CBC: Recent Labs  Lab 03/16/22 1155 03/16/22 1218  WBC 11.2*  --   NEUTROABS 9.8*  --   HGB 17.5* 17.7*  HCT 52.1* 52.0  MCV 94.4  --   PLT 261  --     Basic Metabolic Panel: Recent Labs  Lab 03/16/22 1155 03/16/22 1218  NA 135 137  K 4.3 4.3  CL 102  --   CO2 25  --   GLUCOSE 116*  --   BUN 20  --   CREATININE 1.14  --   CALCIUM 8.5*  --   MG 1.6*  --    GFR: Estimated Creatinine Clearance: 87 mL/min (by C-G formula based on SCr of 1.14 mg/dL). Recent Labs  Lab 03/16/22 1155 03/16/22 1228  WBC 11.2*  --   LATICACIDVEN  --  2.4*    Liver Function Tests: Recent Labs  Lab 03/16/22 1155  AST 37  ALT 32  ALKPHOS 71  BILITOT 0.8  PROT 6.3*  ALBUMIN 3.3*   No results for input(s): "LIPASE", "AMYLASE" in the last 168 hours. No results for input(s): "AMMONIA" in the last 168 hours.  ABG    Component Value Date/Time   HCO3 28.7 (H) 03/16/2022 1218   TCO2 30 03/16/2022 1218   O2SAT 93 03/16/2022 1218     Coagulation Profile: Recent Labs  Lab 03/16/22 1155  INR 1.0    Cardiac Enzymes: Recent Labs  Lab 03/16/22 1155  CKTOTAL 266    HbA1C: No results found for: "HGBA1C"  CBG: Recent Labs  Lab 03/16/22 1303  GLUCAP 118*    Review of Systems:   Review of Systems  Unable to perform ROS: Acuity of condition    Past Medical History:  He,  has a past medical history of Asthma, Gastric ulcer, Hepatitis C, and Hypertension.   Surgical History:  No past surgical history on file.   Social History:   reports that he has quit smoking. His smoking use included  cigarettes. He has never used smokeless tobacco. He reports current drug use. Drug: IV. He reports that he does not drink alcohol.   Family History:  His family history is not on file.   Allergies Allergies  Allergen Reactions   Tylenol [Acetaminophen] Rash     Home Medications  Prior to Admission medications   Medication Sig Start Date End Date Taking? Authorizing Provider  ibuprofen (ADVIL) 200 MG tablet Take 800-1,000 mg by mouth as needed for moderate pain.   Yes [provider]  Cariprazine HCl (VRAYLAR) 1.5 MG CAPS Take 1 capsule (1.5 mg total) by mouth 2 (two) times daily before a meal.  For mood control Patient not taking: Reported on 03/16/2022 01/11/17   Armandina Stammer I, NP  gabapentin (NEURONTIN) 300 MG capsule Take 1 capsule (300 mg total) by mouth 3 (three) times daily. For agitation Patient not taking: Reported on 03/16/2022 01/11/17   Armandina Stammer I, NP  hydrOXYzine (ATARAX/VISTARIL) 25 MG tablet Take 1 tablet (25 mg total) by mouth every 6 (six) hours as needed for anxiety. Patient not taking: Reported on 03/16/2022 01/11/17   Armandina Stammer I, NP  Multiple Vitamin (MULTIVITAMIN WITH MINERALS) TABS tablet Take 1 tablet by mouth daily. For low vitamin Patient not taking: Reported on 03/16/2022 01/12/17   Armandina Stammer I, NP  omeprazole (PRILOSEC) 40 MG capsule TAKE ONE CAPSULE BY MOUTH EVERY MORNING daily: For acid reflux Patient not taking: Reported on 03/16/2022 01/11/17   Armandina Stammer I, NP  traZODone (DESYREL) 50 MG tablet Take 1 tablet (50 mg total) by mouth at bedtime as needed for sleep. Patient not taking: Reported on 03/16/2022 01/11/17   Sanjuana Kava, NP     Critical care time:    Royston Bake, NP-S

## 2022-03-16 NOTE — ED Notes (Signed)
Attempted report 

## 2022-03-16 NOTE — Progress Notes (Signed)
eLink Physician-Brief Progress Note Patient Name: Jared [name] DOB: 04-Mar-1981 MRN: [id number]   Date of Service  03/16/2022  HPI/Events of Note  Patient admitted with altered mental status and acute respiratory failure secondary to  multi-substance unintentional overdose of illicit drugs, patient also appears to have an NSTEMI probably secondary to demand.  eICU Interventions  New Patient Evaluation.        Migdalia Dk 03/16/2022, 7:33 PM
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
## 2022-03-16 NOTE — ED Notes (Signed)
Echo at bedside
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
## 2022-03-16 NOTE — ED Notes (Signed)
Pt Mother would like a update please, [phone] Effie Shy.

## 2022-03-16 NOTE — Progress Notes (Addendum)
Pharmacy Antibiotic Note  Jared [name] is a 41 y.o. male admitted on 03/16/2022 with bacteremia.  Pharmacy has been consulted for vancomycin and Unasyn dosing.  Plan: Give IV Vancomycin 1750 x 1 for loading dose, followed by IV Vancomycin 1250 every 12 hours for eAUC539. Unasyn 3g Q6H.  Follow culture data for de-escalation.  Monitor renal function for dose adjustments as indicated.    Height: 5\' 9"  (175.3 cm) Weight: 84 kg (185 lb 3 oz) IBW/kg (Calculated) : 70.7  Temp (24hrs), Avg:101.3 F (38.5 C), Min:101.3 F (38.5 C), Max:101.3 F (38.5 C)  Recent Labs  Lab 03/16/22 1155  WBC 11.2*  CREATININE 1.14    Estimated Creatinine Clearance: 87 mL/min (by C-G formula based on SCr of 1.14 mg/dL).    Allergies  Allergen Reactions   Tylenol [Acetaminophen] Rash    Thank you for allowing pharmacy to be a part of this patient's care.  03/18/22, PharmD, BCCCP  03/16/2022 2:53 PM

## 2022-03-16 NOTE — ED Notes (Signed)
Critical care at bedside  

## 2022-03-16 NOTE — ED Notes (Addendum)
Critical care Brett Canales) notified of elevated lactic

## 2022-03-16 NOTE — ED Notes (Signed)
Critical care (peter b) notified of elevated troponin
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
## 2022-03-16 NOTE — ED Notes (Signed)
Wasted 2 mg morphine in [facility] med room; witnessed by [employer], Charity fundraiser

## 2022-03-16 NOTE — Progress Notes (Signed)
  Echocardiogram 2D Echocardiogram has been performed.  Jared [name] 03/16/2022, 4:30 PM

## 2022-03-17 ENCOUNTER — Inpatient Hospital Stay (HOSPITAL_COMMUNITY): Payer: BC Managed Care – PPO

## 2022-03-17 DIAGNOSIS — T1491XA Suicide attempt, initial encounter: Secondary | ICD-10-CM | POA: Diagnosis not present

## 2022-03-17 LAB — BASIC METABOLIC PANEL
Anion gap: 12 (ref 5–15)
BUN: 15 mg/dL (ref 6–20)
CO2: 23 mmol/L (ref 22–32)
Calcium: 8.2 mg/dL — ABNORMAL LOW (ref 8.9–10.3)
Chloride: 101 mmol/L (ref 98–111)
Creatinine, Ser: 0.71 mg/dL (ref 0.61–1.24)
GFR, Estimated: >60 mL/min (ref >60)
Glucose, Bld: 113 mg/dL — ABNORMAL HIGH (ref 70–99)
Potassium: 4 mmol/L (ref 3.5–5.1)
Sodium: 136 mmol/L (ref 135–145)

## 2022-03-17 LAB — CBC
HCT: 43.1 % (ref 39.0–52.0)
Hemoglobin: 14.3 g/dL (ref 13.0–17.0)
MCH: 31.4 pg (ref 26.0–34.0)
MCHC: 33.2 g/dL (ref 30.0–36.0)
MCV: 94.5 fL (ref 80.0–100.0)
Platelets: 220 10*3/uL (ref 150–400)
RBC: 4.56 MIL/uL (ref 4.22–5.81)
RDW: 13.1 % (ref 11.5–15.5)
WBC: 27.1 10*3/uL — ABNORMAL HIGH (ref 4.0–10.5)
nRBC: 0 % (ref 0.0–0.2)

## 2022-03-17 LAB — CULTURE, BLOOD (ROUTINE X 2): Culture: NO GROWTH

## 2022-03-17 LAB — GLUCOSE, CAPILLARY: Glucose-Capillary: 129 mg/dL — ABNORMAL HIGH (ref 70–99)

## 2022-03-17 LAB — MAGNESIUM: Magnesium: 1.5 mg/dL — ABNORMAL LOW (ref 1.7–2.4)

## 2022-03-17 LAB — PHOSPHORUS: Phosphorus: 2.4 mg/dL — ABNORMAL LOW (ref 2.5–4.6)

## 2022-03-17 MED ORDER — SODIUM PHOSPHATES 45 MMOLE/15ML IV SOLN
15.0000 mmol | Freq: Once | INTRAVENOUS | Status: AC
  Administered 2022-03-17: 15 mmol via INTRAVENOUS
  Filled 2022-03-17: qty 5

## 2022-03-17 MED ORDER — SODIUM CHLORIDE 0.9 % IV SOLN
1.0000 mg | Freq: Every day | INTRAVENOUS | Status: DC
  Administered 2022-03-17 – 2022-03-19 (×3): 1 mg via INTRAVENOUS
  Filled 2022-03-17 (×5): qty 0.2

## 2022-03-17 MED ORDER — ENOXAPARIN SODIUM 40 MG/0.4ML IJ SOSY
40.0000 mg | PREFILLED_SYRINGE | INTRAMUSCULAR | Status: DC
  Administered 2022-03-17 – 2022-03-24 (×8): 40 mg via SUBCUTANEOUS
  Filled 2022-03-17 (×8): qty 0.4

## 2022-03-17 MED ORDER — THIAMINE HCL 100 MG/ML IJ SOLN
100.0000 mg | Freq: Every day | INTRAMUSCULAR | Status: DC
  Administered 2022-03-17 – 2022-03-21 (×5): 100 mg via INTRAVENOUS
  Filled 2022-03-17 (×5): qty 2

## 2022-03-17 MED ORDER — MAGNESIUM SULFATE 2 GM/50ML IV SOLN
2.0000 g | Freq: Once | INTRAVENOUS | Status: AC
  Administered 2022-03-17: 2 g via INTRAVENOUS
  Filled 2022-03-17: qty 50

## 2022-03-17 MED ORDER — CARIPRAZINE HCL 1.5 MG PO CAPS
1.5000 mg | ORAL_CAPSULE | Freq: Every day | ORAL | Status: DC
  Administered 2022-03-18 – 2022-03-22 (×6): 1.5 mg via ORAL
  Filled 2022-03-17 (×7): qty 1

## 2022-03-17 NOTE — Plan of Care (Signed)
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
## 2022-03-17 NOTE — Progress Notes (Signed)
NAME:  Jared [name], MRN:  [id number], DOB:  Oct 26, 1980, LOS: 1 ADMISSION DATE:  03/16/2022, CONSULTATION DATE:  03/16/2022 REFERRING MD:  Jearld Fenton, MD, CHIEF COMPLAINT:  Septic Shock   History of Present Illness:  Jared [name] is a 41 y.o. male with a PMH HTN, Asthma, Hep C,  polysubstance dependence including opioid type drugs, substance induced mood disorder, and hx of prior overdose, He presented to the ED by EMS today after pt mother found him down (unknown downtime) next to Heroin, Xanax, and Wine. EMS gave Narcan with positive response, and arrived to the ED with Spo2 in the 70's, placed on NRB. Pt was alert to stimuli and had vomit on his shirt. Pt respiratory status continued to decompensate leading to needing Heated HFNC to maintain Spo2 >90%. Pt also became hypotensive (71/55), requiring NE post his 3L fluid boluses.   ED workup reveled WBC 11.2, Trop 307, LA 2.4, Blood Cultures Pending, CXR showed diffuse alveolar densities seen in both lungs, more so in right lower lung field suggesting multifocal PNA. He presented with a rectal temp 101.41F.   PCCM consulted in the setting of Septic Shock.   Pertinent  Medical History  polysubstance dependence including opioid type drugs, substance induced mood disorder, and hx of prior overdose, asthma, Hep C, HTN  Significant Hospital Events: Including procedures, antibiotic start and stop dates in addition to other pertinent events   12/22 - ED > 3L Fluid received Azithromycin/Ceftriaxone, Vanco, HHFNC, NE started > Admitted to ICU  Interim History / Subjective:  No acute events overnight. Off levo since yesterday evening.  Denies suicidal ideation presently and as a cause of his overdose yesterday.  Objective   Blood pressure 129/84, pulse 97, temperature 98.3 F (36.8 C), temperature source Oral, resp. rate (!) 22, height 5\' 9"  (1.753 m), weight 82 kg, SpO2 94 %.    FiO2 (%):  [100 %] 100 %   Intake/Output Summary (Last 24  hours) at 03/17/2022 1022 Last data filed at 03/17/2022 0800 Gross per 24 hour  Intake 3136.96 ml  Output 1600 ml  Net 1536.96 ml   Filed Weights   03/16/22 1219 03/17/22 0500  Weight: 84 kg 82 kg   Physical Examination: General: Adult male in NAD HEENT: /AT, PERRL, no JVD Neuro: Easily arouses to verbal. Interacts appropriately  CV: RRR, no MRG PULM: Clear bilateral breath sounds. No distress on 6L with sats in the mid 90s.  GI: Soft, NT, ND Extremities: No acute deformity or ROM limitation.   Resolved Hospital Problem list     Assessment & Plan:  Septic Shock w/ Lactic Acidosis> likely secondary to aspiration PNA d/t overdose with unknown downtime Plan - Trend WBC, fever curve - F/u Cx data - Trend LA 2.4  - Unasyn, Vanco. Low threshold to DC vanc, but with MRSA PRCR and PCT 23 positive will continue at least another day.  Acute Respiratory Failure> in the setting of asp. PNA post overdose, requiring HHFNC  Plan - Weaned to 6L  - Spo2 Goal > 92% - Pulmonary Hygiene   Hx of Asthma Plan - PRN SABA  Elevated Troponin> likely demand ischemia but also risk for endocarditis given IV drug abuse hx. Plan - Trop mildly elevated. No ischemic or ischemia equivalent complaints.  - Echo showing LVEF 30-35%, no comparison. Consult cardiology for eventual workup.   Possible Withdrawal > drinks a box of wine per week, last drink 12/21 Plan - Phenobarb taper continue - Add CIWA if  necessary   Mood Disorder with Intentional Overdose: (patient now states no intent for self harm). He does have history of intentional overdose in 2018 for which he was [facility] to [facility].  Plan  - Resume home cariprazine (BID at home, can only order daily here) - Holding home gabapentin, hydroxyzine - Psych consult placed.   Electrolyte imbalance.  - supp mag  Transfer to floor TRH  Best Practice (right click and "Reselect all SmartList Selections" daily)   Diet/type: Regular  consistency (see orders) DVT prophylaxis: LMWH GI prophylaxis: PPI Lines: N/A Foley:  N/A Code Status:  full code Last date of multidisciplinary goals of care discussion [03/16/2022] - See separate note by Zenia Resides on 03/16/2022  Labs   CBC: Recent Labs  Lab 03/16/22 1155 03/16/22 1218 03/17/22 0556  WBC 11.2*  --  27.1*  NEUTROABS 9.8*  --   --   HGB 17.5* 17.7* 14.3  HCT 52.1* 52.0 43.1  MCV 94.4  --  94.5  PLT 261  --  220     Basic Metabolic Panel: Recent Labs  Lab 03/16/22 1155 03/16/22 1218 03/17/22 0556  NA 135 137 136  K 4.3 4.3 4.0  CL 102  --  101  CO2 25  --  23  GLUCOSE 116*  --  113*  BUN 20  --  15  CREATININE 1.14  --  0.71  CALCIUM 8.5*  --  8.2*  MG 1.6*  --  1.5*  PHOS  --   --  2.4*    GFR: Estimated Creatinine Clearance: 124 mL/min (by C-G formula based on SCr of 0.71 mg/dL). Recent Labs  Lab 03/16/22 1155 03/16/22 1228 03/16/22 1636 03/17/22 0556  PROCALCITON  --   --  28.84  --   WBC 11.2*  --   --  27.1*  LATICACIDVEN  --  2.4* 4.9*  --      Liver Function Tests: Recent Labs  Lab 03/16/22 1155  AST 37  ALT 32  ALKPHOS 71  BILITOT 0.8  PROT 6.3*  ALBUMIN 3.3*    No results for input(s): "LIPASE", "AMYLASE" in the last 168 hours. No results for input(s): "AMMONIA" in the last 168 hours.  ABG    Component Value Date/Time   HCO3 28.7 (H) 03/16/2022 1218   TCO2 30 03/16/2022 1218   O2SAT 93 03/16/2022 1218     Coagulation Profile: Recent Labs  Lab 03/16/22 1155  INR 1.0     Cardiac Enzymes: Recent Labs  Lab 03/16/22 1155  CKTOTAL 266     HbA1C: No results found for: "HGBA1C"  CBG: Recent Labs  Lab 03/16/22 1303 03/16/22 1918  GLUCAP 118* 145*     Review of Systems:     Past Medical History:  He,  has a past medical history of Asthma, Gastric ulcer, Hepatitis C, and Hypertension.   Surgical History:  No past surgical history on file.   Social History:   reports that he has quit  smoking. His smoking use included cigarettes. He has never used smokeless tobacco. He reports current drug use. Drug: IV. He reports that he does not drink alcohol.   Family History:  His family history is not on file.   Allergies Allergies  Allergen Reactions   Tylenol [Acetaminophen] Rash     Home Medications  Prior to Admission medications   Medication Sig Start Date End Date Taking? Authorizing Provider  ibuprofen (ADVIL) 200 MG tablet Take 800-1,000 mg by mouth as needed  for moderate pain.   Yes [provider]  Cariprazine HCl (VRAYLAR) 1.5 MG CAPS Take 1 capsule (1.5 mg total) by mouth 2 (two) times daily before a meal. For mood control Patient not taking: Reported on 03/16/2022 01/11/17   Armandina Stammer I, NP  gabapentin (NEURONTIN) 300 MG capsule Take 1 capsule (300 mg total) by mouth 3 (three) times daily. For agitation Patient not taking: Reported on 03/16/2022 01/11/17   Armandina Stammer I, NP  hydrOXYzine (ATARAX/VISTARIL) 25 MG tablet Take 1 tablet (25 mg total) by mouth every 6 (six) hours as needed for anxiety. Patient not taking: Reported on 03/16/2022 01/11/17   Armandina Stammer I, NP  Multiple Vitamin (MULTIVITAMIN WITH MINERALS) TABS tablet Take 1 tablet by mouth daily. For low vitamin Patient not taking: Reported on 03/16/2022 01/12/17   Armandina Stammer I, NP  omeprazole (PRILOSEC) 40 MG capsule TAKE ONE CAPSULE BY MOUTH EVERY MORNING daily: For acid reflux Patient not taking: Reported on 03/16/2022 01/11/17   Armandina Stammer I, NP  traZODone (DESYREL) 50 MG tablet Take 1 tablet (50 mg total) by mouth at bedtime as needed for sleep. Patient not taking: Reported on 03/16/2022 01/11/17   Sanjuana Kava, NP     Critical care time:     Joneen Roach, AGACNP-BC [facility]  See Amion for personal pager PCCM on call pager [phone] until 7pm. Please call Elink 7p-7a. [phone]  03/17/2022 10:43 AM

## 2022-03-17 NOTE — Consult Note (Addendum)
[facility] Face-to-Face Psychiatry Consult   Reason for Consult:  suicide attempt Referring Physician:  NP Mikey Bussing  Patient Identification: Jared [name] MRN:  [id number] Principal Diagnosis: Intentional heroin overdose [facility]) Diagnosis:  Principal Problem:   Intentional heroin overdose (HCC) Active Problems:   Polysubstance dependence including opioid type drug, continuous use (HCC)   Aspiration pneumonia (HCC)   Acute hypoxic respiratory failure (HCC)   Septic shock (HCC)   Alteration in electrolyte and fluid balance   ARDS (adult respiratory distress syndrome) (HCC)   Jared [name] is a 41 y.o. male patient with a psychiatric history of polysubstance abuse and substance-induced mood disorder, admitted with intentional overdose on heroin, xanax, alcohol.  Psychiatry consulted for evaluation.   HPI:  Patient brought to the ER after being found down yesterday  morning at home by family who called 911. Reportedly consumed heroin, xanax, and alcohol, which is typical for the patient. He given IV narcan, taken to the hospital. ED workup reveled WBC 11.2, Trop 307, LA 2.4, Blood Cultures Pending, CXR showed diffuse alveolar densities seen in both lungs, more so in right lower lung field suggesting multifocal PNA. He presented with a rectal temp 101.73F. Admitted to ICU with septic shock, lactic acidosis.  Per chart, patient`s mother called and reported to SW concerns about her own safety at home with patient as he was violent towards her.  Patient seen in ICU room. He reports feeling weak, but agreeable to talk a little.  Patient reports he was released from prison about two months ago, on October 12th, after spending six years in incarceration. He reports that he cannot adjusted to the new live since. He reports that the World has changed significantly for him - his wife died, he lost custody over his children, he is not working and has no own place to live. He had no medical insurance, so  could not see psychiatrist to resume medications for depression, he shortly after prison relapsed on drugs (heroin, xanax) and drinking. He reports his like is miserable, he has nothing to live for and he wish the EMS would never wake him up and "I wish I`d not be here". Currently identifies her mood as "down", depressed, feeling sad, empty, hopeless, unmotivated; has no interest or pleasure in activities, low self-esteem, insomnia, feeling of worthlessness, recurrent thoughts of death or suicide. Reports continuous thoughts of suicide, denies thoughts of harming others. He reports similar past suicidal attempts via overdose on drugs. Also reports mood swings, feeling irritable, easily distractible. Patient denies any hallucinations. Patient does not express any delusions.  Patient reports he got medical insurance to be effective in January; states he made an appointment for January with mental health to restart medications, although currently he is feeling suicidal and cannot contract for safety if discharged.  Past Psychiatric History:  Dx: opioid use d/o, benzodiazepine use d/o, alcohol use d/o, substance-induced mood disorder. Reports h/o past suicidal attempts via OD on drugs. H/o past psych admissions; last 2018. Was at [facility] for Tx of addiction. No recent psych medications. Past psych medications: Vraylar, Gabapentin, Trazodone.   Past Medical History:  Past Medical History:  Diagnosis Date   Asthma    Gastric ulcer    Hepatitis C    Hypertension    No past surgical history on file. Family History: No family history on file.  Social History:  Social History   Substance and Sexual Activity  Alcohol Use No     Social History   Substance and  Sexual Activity  Drug Use Yes   Types: IV   Comment: herion    Social History   Socioeconomic History   Marital status: Single    Spouse name: Not on file   Number of children: Not on file   Years of education: Not on file    Highest education level: Not on file  Occupational History   Not on file  Tobacco Use   Smoking status: Former    Types: Cigarettes   Smokeless tobacco: Never  Substance and Sexual Activity   Alcohol use: No   Drug use: Yes    Types: IV    Comment: herion   Sexual activity: Not on file  Other Topics Concern   Not on file  Social History Narrative   Not on file   Social Determinants of Health   Financial Resource Strain: Not on file  Food Insecurity: Not on file  Transportation Needs: Not on file  Physical Activity: Not on file  Stress: Not on file  Social Connections: Not on file   Additional Social History:    Allergies:   Allergies  Allergen Reactions   Tylenol [Acetaminophen] Rash    Labs:  Results for orders placed or performed during the hospital encounter of 03/16/22 (from the past 48 hour(s))  CBC with Differential     Status: Abnormal   Collection Time: 03/16/22 11:55 AM  Result Value Ref Range   WBC 11.2 (H) 4.0 - 10.5 K/uL   RBC 5.52 4.22 - 5.81 MIL/uL   Hemoglobin 17.5 (H) 13.0 - 17.0 g/dL   HCT 04.5 (H) 40.9 - 81.1 %   MCV 94.4 80.0 - 100.0 fL   MCH 31.7 26.0 - 34.0 pg   MCHC 33.6 30.0 - 36.0 g/dL   RDW 91.4 78.2 - 95.6 %   Platelets 261 150 - 400 K/uL   nRBC 0.0 0.0 - 0.2 %   Neutrophils Relative % 88 %   Neutro Abs 9.8 (H) 1.7 - 7.7 K/uL   Lymphocytes Relative 6 %   Lymphs Abs 0.7 0.7 - 4.0 K/uL   Monocytes Relative 6 %   Monocytes Absolute 0.6 0.1 - 1.0 K/uL   Eosinophils Relative 0 %   Eosinophils Absolute 0.0 0.0 - 0.5 K/uL   Basophils Relative 0 %   Basophils Absolute 0.0 0.0 - 0.1 K/uL   Immature Granulocytes 0 %   Abs Immature Granulocytes 0.03 0.00 - 0.07 K/uL    Comment: Performed at [facility] Lab, [address], [place], [place] [address]  Comprehensive metabolic panel     Status: Abnormal   Collection Time: 03/16/22 11:55 AM  Result Value Ref Range   Sodium 135 135 - 145 mmol/L   Potassium 4.3 3.5 - 5.1 mmol/L    Chloride 102 98 - 111 mmol/L   CO2 25 22 - 32 mmol/L   Glucose, Bld 116 (H) 70 - 99 mg/dL    Comment: Glucose reference range applies only to samples taken after fasting for at least 8 hours.   BUN 20 6 - 20 mg/dL   Creatinine, Ser 6.57 0.61 - 1.24 mg/dL   Calcium 8.5 (L) 8.9 - 10.3 mg/dL   Total Protein 6.3 (L) 6.5 - 8.1 g/dL   Albumin 3.3 (L) 3.5 - 5.0 g/dL   AST 37 15 - 41 U/L   ALT 32 0 - 44 U/L   Alkaline Phosphatase 71 38 - 126 U/L   Total Bilirubin 0.8 0.3 - 1.2 mg/dL  GFR, Estimated >60 >60 mL/min    Comment: (NOTE) Calculated using the CKD-EPI Creatinine Equation (2021)    Anion gap 8 5 - 15    Comment: Performed at [facility], [address], [place], [place] [address]  Magnesium     Status: Abnormal   Collection Time: 03/16/22 11:55 AM  Result Value Ref Range   Magnesium 1.6 (L) 1.7 - 2.4 mg/dL    Comment: Performed at [facility], [address], [place], [place] [address]  Ethanol     Status: None   Collection Time: 03/16/22 11:55 AM  Result Value Ref Range   Alcohol, Ethyl (B) <10 <10 mg/dL    Comment: (NOTE) Lowest detectable limit for serum alcohol is 10 mg/dL.  For medical purposes only. Performed at [facility] Lab, [address] [place], [place] [address]   Acetaminophen level     Status: Abnormal   Collection Time: 03/16/22 11:55 AM  Result Value Ref Range   Acetaminophen (Tylenol), Serum <10 (L) 10 - 30 ug/mL    Comment: (NOTE) Therapeutic concentrations vary significantly. A range of 10-30 ug/mL  may be an effective concentration for many patients. However, some  are best treated at concentrations outside of this range. Acetaminophen concentrations >150 ug/mL at 4 hours after ingestion  and >50 ug/mL at 12 hours after ingestion are often associated with  toxic reactions.  Performed at [facility], [address], [place], [place] [address]   Salicylate level     Status: Abnormal   Collection Time: 03/16/22 11:55 AM   Result Value Ref Range   Salicylate Lvl <7.0 (L) 7.0 - 30.0 mg/dL    Comment: Performed at [facility] Lab, [address] [place], [place] [address]  Protime-INR     Status: None   Collection Time: 03/16/22 11:55 AM  Result Value Ref Range   Prothrombin Time 13.1 11.4 - 15.2 seconds   INR 1.0 0.8 - 1.2    Comment: (NOTE) INR goal varies based on device and disease states. Performed at [facility] Lab, [address], [place], [place] [address]   APTT     Status: None   Collection Time: 03/16/22 11:55 AM  Result Value Ref Range   aPTT 27 24 - 36 seconds    Comment: Performed at [facility], [address] [place], [place] [address]  CK     Status: None   Collection Time: 03/16/22 11:55 AM  Result Value Ref Range   Total CK 266 49 - 397 U/L    Comment: Performed at [facility], [address] [place], [place] [address]  Troponin I (High Sensitivity)     Status: Abnormal   Collection Time: 03/16/22 11:55 AM  Result Value Ref Range   Troponin I (High Sensitivity) 307 (HH) <18 ng/L    Comment: CRITICAL RESULT CALLED TO, READ BACK BY AND VERIFIED WITH J.Merri Brunette RN [phone] 03/16/22 MCCORMICK K (NOTE) Elevated high sensitivity troponin I (hsTnI) values and significant  changes across serial measurements may suggest ACS but many other  chronic and acute conditions are known to elevate hsTnI results.  Refer to the "Links" section for chest pain algorithms and additional  guidance. Performed at [facility] Lab, [address], [place], [place] [address]   Blood culture (routine x 2)     Status: None (Preliminary result)   Collection Time: 03/16/22 12:02 PM   Specimen: BLOOD LEFT  FOREARM  Result Value Ref Range   Specimen Description BLOOD LEFT FOREARM    Special Requests      BOTTLES DRAWN AEROBIC AND ANAEROBIC Blood Culture results may not be optimal due to an excessive volume of blood received in culture bottles   Culture      NO GROWTH < 24  HOURS Performed at [facility], [address] [place], [place] [address]    Report Status PENDING   Blood culture (routine x 2)     Status: None (Preliminary result)   Collection Time: 03/16/22 12:07 PM   Specimen: BLOOD LEFT FOREARM  Result Value Ref Range   Specimen Description BLOOD LEFT FOREARM    Special Requests      BOTTLES DRAWN AEROBIC ONLY Blood Culture results may not be optimal due to an inadequate volume of blood received in culture bottles   Culture      NO GROWTH < 24 HOURS Performed at [facility], [address] [place], [place] [address]    Report Status PENDING   I-Stat venous blood gas, ([facility], [facility], [facility])     Status: Abnormal   Collection Time: 03/16/22 12:18 PM  Result Value Ref Range   pH, Ven 7.308 7.25 - 7.43   pCO2, Ven 57.3 44 - 60 mmHg   pO2, Ven 76 (H) 32 - 45 mmHg   Bicarbonate 28.7 (H) 20.0 - 28.0 mmol/L   TCO2 30 22 - 32 mmol/L   O2 Saturation 93 %   Acid-Base Excess 1.0 0.0 - 2.0 mmol/L   Sodium 137 135 - 145 mmol/L   Potassium 4.3 3.5 - 5.1 mmol/L   Calcium, Ion 1.13 (L) 1.15 - 1.40 mmol/L   HCT 52.0 39.0 - 52.0 %   Hemoglobin 17.7 (H) 13.0 - 17.0 g/dL   Sample type VENOUS   Lactic acid, plasma     Status: Abnormal   Collection Time: 03/16/22 12:28 PM  Result Value Ref Range   Lactic Acid, Venous 2.4 (HH) 0.5 - 1.9 mmol/L    Comment: CRITICAL RESULT CALLED TO, READ BACK BY AND VERIFIED WITH J.Bernarda Caffey RN [phone] 03/16/22 MCCORMICK K Performed at [facility] Lab, [address], [place], [place] [address]   POC CBG, ED     Status: Abnormal   Collection Time: 03/16/22  1:03 PM  Result Value Ref Range   Glucose-Capillary 118 (H) 70 - 99 mg/dL    Comment: Glucose reference range applies only to samples taken after fasting for at least 8 hours.  Lactic acid, plasma     Status: Abnormal   Collection Time: 03/16/22  4:36 PM  Result Value Ref Range   Lactic Acid, Venous 4.9 (HH) 0.5 - 1.9 mmol/L    Comment: CRITICAL VALUE  NOTED. VALUE IS CONSISTENT WITH PREVIOUSLY REPORTED/CALLED VALUE Performed at [facility] Lab, [address], [place], [place] [address]   HIV Antibody (routine testing w rflx)     Status: None   Collection Time: 03/16/22  4:36 PM  Result Value Ref Range   HIV Screen 4th Generation wRfx Non Reactive Non Reactive    Comment: Performed at [facility], [address] [place], [place] [address]  Procalcitonin     Status: None   Collection Time: 03/16/22  4:36 PM  Result Value Ref Range   Procalcitonin 28.84 ng/mL    Comment:        Interpretation: PCT >= 10 ng/mL: Important systemic inflammatory response,  almost exclusively due to severe bacterial sepsis or septic shock. (NOTE)       Sepsis PCT Algorithm           Lower Respiratory Tract                                      Infection PCT Algorithm    ----------------------------     ----------------------------         PCT < 0.25 ng/mL                PCT < 0.10 ng/mL          Strongly encourage             Strongly discourage   discontinuation of antibiotics    initiation of antibiotics    ----------------------------     -----------------------------       PCT 0.25 - 0.50 ng/mL            PCT 0.10 - 0.25 ng/mL               OR       >80% decrease in PCT            Discourage initiation of                                            antibiotics      Encourage discontinuation           of antibiotics    ----------------------------     -----------------------------         PCT >= 0.50 ng/mL              PCT 0.26 - 0.50 ng/mL                AND       <80% decrease in PCT             Encourage initiation of                                             antibiotics       Encourage continuation           of antibiotics    ----------------------------     -----------------------------        PCT >= 0.50 ng/mL                  PCT > 0.50 ng/mL               AND         increase in PCT                  Strongly encourage                                       initiation of antibiotics    Strongly encourage escalation           of antibiotics                                     -----------------------------  PCT <= 0.25 ng/mL                                                 OR                                        > 80% decrease in PCT                                      Discontinue / Do not initiate                                             antibiotics  Performed at [facility] Lab, [address] [place], [place] [address]   Glucose, capillary     Status: Abnormal   Collection Time: 03/16/22  7:18 PM  Result Value Ref Range   Glucose-Capillary 145 (H) 70 - 99 mg/dL    Comment: Glucose reference range applies only to samples taken after fasting for at least 8 hours.  MRSA Next Gen by PCR, Nasal     Status: Abnormal   Collection Time: 03/16/22  7:25 PM   Specimen: Nasal Mucosa; Nasal Swab  Result Value Ref Range   MRSA by PCR Next Gen DETECTED (A) NOT DETECTED    Comment: RESULT CALLED TO, READ BACK BY AND VERIFIED WITH:  C/ S. SAVAGE, RN 03/16/22 2252 A. LAFRANCE (NOTE) The GeneXpert MRSA Assay (FDA approved for NASAL specimens only), is one component of a comprehensive MRSA colonization surveillance program. It is not intended to diagnose MRSA infection nor to guide or monitor treatment for MRSA infections. Test performance is not FDA approved in patients less than 76 years old. Performed at [facility] Lab, [address] [place], [place] [address]   Troponin I (High Sensitivity)     Status: Abnormal   Collection Time: 03/16/22  9:24 PM  Result Value Ref Range   Troponin I (High Sensitivity) 387 (HH) <18 ng/L    Comment: CRITICAL VALUE NOTED. VALUE IS CONSISTENT WITH PREVIOUSLY REPORTED/CALLED VALUE (NOTE) Elevated high sensitivity troponin I (hsTnI) values and significant  changes across serial measurements may suggest ACS but many other   chronic and acute conditions are known to elevate hsTnI results.  Refer to the "Links" section for chest pain algorithms and additional  guidance. Performed at [facility] Lab, [address] [place], [place] [address]   CBC     Status: Abnormal   Collection Time: 03/17/22  5:56 AM  Result Value Ref Range   WBC 27.1 (H) 4.0 - 10.5 K/uL   RBC 4.56 4.22 - 5.81 MIL/uL   Hemoglobin 14.3 13.0 - 17.0 g/dL   HCT 91.4 78.2 - 95.6 %   MCV 94.5 80.0 - 100.0 fL   MCH 31.4 26.0 - 34.0 pg   MCHC 33.2 30.0 - 36.0 g/dL   RDW 21.3 08.6 - 57.8 %   Platelets 220 150 - 400 K/uL   nRBC 0.0 0.0 - 0.2 %    Comment: Performed at [facility]  Lab, [address], [place], [place] [address]  Basic metabolic panel     Status: Abnormal   Collection Time: 03/17/22  5:56 AM  Result Value Ref Range   Sodium 136 135 - 145 mmol/L   Potassium 4.0 3.5 - 5.1 mmol/L   Chloride 101 98 - 111 mmol/L   CO2 23 22 - 32 mmol/L   Glucose, Bld 113 (H) 70 - 99 mg/dL    Comment: Glucose reference range applies only to samples taken after fasting for at least 8 hours.   BUN 15 6 - 20 mg/dL   Creatinine, Ser 3.24 0.61 - 1.24 mg/dL   Calcium 8.2 (L) 8.9 - 10.3 mg/dL   GFR, Estimated >40 >10 mL/min    Comment: (NOTE) Calculated using the CKD-EPI Creatinine Equation (2021)    Anion gap 12 5 - 15    Comment: Performed at [facility] Lab, [address] [place], [place] [address]  Phosphorus     Status: Abnormal   Collection Time: 03/17/22  5:56 AM  Result Value Ref Range   Phosphorus 2.4 (L) 2.5 - 4.6 mg/dL    Comment: Performed at [facility] Lab, [address], [place], [place] [address]  Magnesium     Status: Abnormal   Collection Time: 03/17/22  5:56 AM  Result Value Ref Range   Magnesium 1.5 (L) 1.7 - 2.4 mg/dL    Comment: Performed at [facility] Lab, [address] [place], [place] [address]    Current Facility-Administered Medications  Medication Dose Route Frequency Provider Last Rate Last  Admin   0.9 %  sodium chloride infusion  250 mL Intravenous Continuous Simonne Martinet, NP       albuterol (PROVENTIL) (2.5 MG/3ML) 0.083% nebulizer solution 2.5 mg  2.5 mg Nebulization Q4H PRN Simonne Martinet, NP       Ampicillin-Sulbactam (UNASYN) 3 g in sodium chloride 0.9 % 100 mL IVPB  3 g Intravenous Q6H Francena Hanly, [facility]   Stopped at 03/17/22 5956   cariprazine (VRAYLAR) capsule 1.5 mg  1.5 mg Oral Daily Olalere, Adewale A, MD       Chlorhexidine Gluconate Cloth 2 % PADS 6 each  6 each Topical Daily Olalere, Adewale A, MD   6 each at 03/17/22 1021   docusate sodium (COLACE) capsule 100 mg  100 mg Oral BID PRN Simonne Martinet, NP       enoxaparin (LOVENOX) injection 40 mg  40 mg Subcutaneous Q24H Olalere, Adewale A, MD       folic acid 1 mg in sodium chloride 0.9 % 50 mL IVPB  1 mg Intravenous Daily Olalere, Adewale A, MD 100 mL/hr at 03/17/22 1135 Infusion Verify at 03/17/22 1135   lactated ringers infusion   Intravenous Continuous Simonne Martinet, NP   Paused at 03/17/22 1106   LORazepam (ATIVAN) injection 1 mg  1 mg Intravenous Q4H PRN Francena Hanly, RPH   1 mg at 03/16/22 2010   mupirocin ointment (BACTROBAN) 2 % 1 Application  1 Application Nasal BID Olalere, Adewale A, MD   1 Application at 03/17/22 1021   norepinephrine (LEVOPHED) 4mg  in (0.016 mg/mL) premix infusion  2-10 mcg/min Intravenous Titrated , NP   Stopped at 03/16/22 2025   ondansetron (ZOFRAN) injection 4 mg  4 mg Intravenous Q6H PRN 03/18/22, NP       Oral care mouth rinse  15 mL Mouth Rinse 4 times per day  Tomma Lightning, MD   15 mL at 03/17/22 1106   Oral care mouth rinse  15 mL Mouth Rinse PRN Olalere, Adewale A, MD       PHENObarbital (LUMINAL) injection 97.5 mg  97.5 mg Intravenous Q8H Francena Hanly, RPH   97.5 mg at 03/17/22 0830   Followed by   Melene Muller ON 03/18/2022] PHENObarbital (LUMINAL) injection 65 mg  65 mg Intravenous Q8H Wilson, Gertie Gowda, RPH       Followed  by   Melene Muller ON 03/20/2022] PHENObarbital (LUMINAL) injection 32.5 mg  32.5 mg Intravenous Q8H Wilson, Heather W, RPH       polyethylene glycol (MIRALAX / GLYCOLAX) packet 17 g  17 g Oral Daily PRN Simonne Martinet, NP       sodium phosphate 15 mmol in dextrose 5 % 250 mL infusion  15 mmol Intravenous Once Olalere, Adewale A, MD 43 mL/hr at 03/17/22 1135 15 mmol at 03/17/22 1135   thiamine (VITAMIN B1) injection 100 mg  100 mg Intravenous Daily Olalere, Adewale A, MD   100 mg at 03/17/22 1007   vancomycin (VANCOREADY) IVPB 1250 mg/250 mL  1,250 mg Intravenous Q12H Simonne Martinet, NP   Stopped at 03/17/22 0404    Musculoskeletal: Strength & Muscle Tone: decreased Gait & Station:  n/a Patient leans: N/A   Psychiatric Specialty Exam:  Appearance:  CM in ICU bed, appearing stated age,  wearing hospital clothes, with decreased grooming.  Attitude/Behavior: calm, cooperative, engaging with appropriate eye contact.  Motor: dyskinesias not evident.   Speech: spontaneous, clear, coherent, normal comprehension.  Mood: dysthymic, "depressed".  Affect: restricted  Thought process: patient appears coherent, organized, goal-directed.  Thought content: patient reports suicidal thoughts, denies homicidal thoughts; did not express any delusions.  Thought perception: patient denies auditory and visual hallucinations. Did not appear internally stimulated.  Cognition: patient is alert and oriented in self, place, date.  Insight: limited  Judgement: poor  Physical Exam: Physical Exam ROS Blood pressure 129/84, pulse 97, temperature 98.3 F (36.8 C), temperature source Oral, resp. rate (!) 22, height 5\' 9"  (1.753 m), weight 82 kg, SpO2 94 %. Body mass index is 26.7 kg/m.   Assessment: 41 y.o. male patient with a psychiatric history of polysubstance abuse and substance-induced mood disorder, admitted with intentional overdose on heroin, xanax, alcohol. Currently in ICU with septic  shock. Patient reports feeling deeply depressed in settings of life stressors, and is still suicidal and hopeless. Patient cannot contract for safety if discharged, so recommended for an inpatient psychiatric admission.  Recommendations -1:1 sitter 24/7, do not discharge even [facility] clothing only, no personal belongings in room -When "medically clear", primary team should contact TOC to facilitate transfer to inpatient psychiatric facility. -COWS-protocol for acute opioid withdrawal, CIWA-protocol for acute alcohol and benzo withdrawal. -primary team restarted Vraylar 1.5mg  po daily for mood stabilization - ok to continue as patient reports good effect in the past. -Psychiatry will follow while here.  Treatment Plan Summary: Daily contact with patient to assess and evaluate symptoms and progress in treatment and Medication management  Disposition: Recommend psychiatric Inpatient admission when medically cleared.  [facility], MD 03/17/2022 12:50 PM

## 2022-03-17 NOTE — Progress Notes (Signed)
Provided mother (Jared [name]) with an update with patient. All questions answered. Pt currently on 1:1 watch w/ sitter at bedside. Patient stable at this time. WCTM

## 2022-03-17 NOTE — H&P (Incomplete)
NAME:  Jared [name], MRN:  [id number], DOB:  June 13, 1980, LOS: 1 ADMISSION DATE:  03/16/2022, CONSULTATION DATE:  03/16/2022 REFERRING MD:  Jearld Fenton, MD, CHIEF COMPLAINT:  Septic Shock   History of Present Illness:  Jared [name] is a 41 y.o. male with a PMH HTN, Asthma, Hep C,  polysubstance dependence including opioid type drugs, substance induced mood disorder, and hx of prior overdose, He presented to the ED by EMS today after pt mother found him down (unknown downtime) next to Heroin, Xanax, and Wine. EMS gave Narcan with positive response, and arrived to the ED with Spo2 in the 70's, placed on NRB. Pt was alert to stimuli and had vomit on his shirt. Pt respiratory status continued to decompensate leading to needing Heated HFNC to maintain Spo2 >90%. Pt also became hypotensive (71/55), requiring NE post his 3L fluid boluses.   ED workup reveled WBC 11.2, Trop 307, LA 2.4, Blood Cultures Pending, CXR showed diffuse alveolar densities seen in both lungs, more so in right lower lung field suggesting multifocal PNA. He presented with a rectal temp 101.89F.   PCCM consulted in the setting of Septic Shock.   Pertinent  Medical History  polysubstance dependence including opioid type drugs, substance induced mood disorder, and hx of prior overdose, asthma, Hep C, HTN  Significant Hospital Events: Including procedures, antibiotic start and stop dates in addition to other pertinent events   12/22 - ED > 3L Fluid received Azithromycin/Ceftriaxone, Vanco, HHFNC, NE started > Admitted to ICU  Interim History / Subjective:  Pt is sitting in bed, alert and able to communicate effectively at this time. He admits to using heroin today, and has so in the past. He states that he not in any pain at this time and feel that he is able to breathe fine. He is understanding that he has PNA and requiring high amounts of oxygen to keep him stable. Conversation was had at bedside that if things do not progress,  he may need intubation and central access. He agrees to this, but asking for [place] to call his mother.   Objective   Blood pressure 110/76, pulse 77, temperature 98.3 F (36.8 C), temperature source Oral, resp. rate 19, height 5\' 9"  (1.753 m), weight 82 kg, SpO2 94 %.    FiO2 (%):  [100 %] 100 %   Intake/Output Summary (Last 24 hours) at 03/17/2022 0951 Last data filed at 03/17/2022 0800 Gross per 24 hour  Intake 3136.96 ml  Output 1600 ml  Net 1536.96 ml   Filed Weights   03/16/22 1219 03/17/22 0500  Weight: 84 kg 82 kg   Physical Examination: General: Acutely ill-appearing male in NAD. On HHFNC HEENT: [place]/AT, anicteric sclera, PERRL, moist mucous membranes. Neuro: Awake, oriented x 4. Responds to verbal stimuli. Following commands consistently. Moves all 4 extremities spontaneously.  CV: Tachycardic, no m/g/r. PULM: Breathing even and unlabored on HHFNC 40L . Lung fields Coarse/Diminished. GI: Soft, nontender, nondistended. Normoactive bowel sounds. Extremities: No LE edema noted. Skin:  Cool/Dry. Resolved Hospital Problem list     Assessment & Plan:  Septic Shock w/ Lactic Acidosis> likely secondary to aspiration PNA d/t overdose with unknown downtime Plan - Start PIV NE  - NE titrated to goal MAP with Goal >65 - Starting Stress Dose Steroids (12/22) - Trend WBC, fever curve - F/u Cx data - F/u Procal - Trend LA 2.4  - Continue Vancomycin (12/22), Off Azithromycin/Ceftriaxone  - Start Unasyn  (12/22)  Acute Respiratory Failure>  in the setting of asp. PNA post overdose, requiring HHFNC  Plan - Continue Heated HFNC  - Closely monitor Resp Status - Spo2 Goal >92% - High Risk for Intubation  - Pulmonary Hygiene   Hx of Asthma Plan - PRN SABA  Elevated Troponin> likely demand ischemia but also risk for endocarditis given IV drug abuse hx  Plan - Trend Trops - ECHO  Possible Withdrawal > drinks a box of wine per week, last drink 12/21 Plan - Adding  Phenobarbital    Mood Disorder with Intentional Overdose Plan  - Holding home meds in acute shock setting, will add as appropriate  - Will need to consult Psych once shock resolves  Best Practice (right click and "Reselect all SmartList Selections" daily)   Diet/type: NPO DVT prophylaxis: LMWH GI prophylaxis: PPI Lines: N/A Foley:  N/A Code Status:  full code Last date of multidisciplinary goals of care discussion [03/16/2022] - See separate note by Zenia Resides on 03/16/2022  Labs   CBC: Recent Labs  Lab 03/16/22 1155 03/16/22 1218 03/17/22 0556  WBC 11.2*  --  27.1*  NEUTROABS 9.8*  --   --   HGB 17.5* 17.7* 14.3  HCT 52.1* 52.0 43.1  MCV 94.4  --  94.5  PLT 261  --  220     Basic Metabolic Panel: Recent Labs  Lab 03/16/22 1155 03/16/22 1218 03/17/22 0556  NA 135 137 136  K 4.3 4.3 4.0  CL 102  --  101  CO2 25  --  23  GLUCOSE 116*  --  113*  BUN 20  --  15  CREATININE 1.14  --  0.71  CALCIUM 8.5*  --  8.2*  MG 1.6*  --  1.5*  PHOS  --   --  2.4*    GFR: Estimated Creatinine Clearance: 124 mL/min (by C-G formula based on SCr of 0.71 mg/dL). Recent Labs  Lab 03/16/22 1155 03/16/22 1228 03/16/22 1636 03/17/22 0556  PROCALCITON  --   --  28.84  --   WBC 11.2*  --   --  27.1*  LATICACIDVEN  --  2.4* 4.9*  --      Liver Function Tests: Recent Labs  Lab 03/16/22 1155  AST 37  ALT 32  ALKPHOS 71  BILITOT 0.8  PROT 6.3*  ALBUMIN 3.3*    No results for input(s): "LIPASE", "AMYLASE" in the last 168 hours. No results for input(s): "AMMONIA" in the last 168 hours.  ABG    Component Value Date/Time   HCO3 28.7 (H) 03/16/2022 1218   TCO2 30 03/16/2022 1218   O2SAT 93 03/16/2022 1218     Coagulation Profile: Recent Labs  Lab 03/16/22 1155  INR 1.0     Cardiac Enzymes: Recent Labs  Lab 03/16/22 1155  CKTOTAL 266     HbA1C: No results found for: "HGBA1C"  CBG: Recent Labs  Lab 03/16/22 1303 03/16/22 1918  GLUCAP 118*  145*     Review of Systems:     Past Medical History:  He,  has a past medical history of Asthma, Gastric ulcer, Hepatitis C, and Hypertension.   Surgical History:  No past surgical history on file.   Social History:   reports that he has quit smoking. His smoking use included cigarettes. He has never used smokeless tobacco. He reports current drug use. Drug: IV. He reports that he does not drink alcohol.   Family History:  His family history is not on file.   Allergies  Allergies  Allergen Reactions   Tylenol [Acetaminophen] Rash     Home Medications  Prior to Admission medications   Medication Sig Start Date End Date Taking? Authorizing Provider  ibuprofen (ADVIL) 200 MG tablet Take 800-1,000 mg by mouth as needed for moderate pain.   Yes [provider]  Cariprazine HCl (VRAYLAR) 1.5 MG CAPS Take 1 capsule (1.5 mg total) by mouth 2 (two) times daily before a meal. For mood control Patient not taking: Reported on 03/16/2022 01/11/17   Armandina Stammer I, NP  gabapentin (NEURONTIN) 300 MG capsule Take 1 capsule (300 mg total) by mouth 3 (three) times daily. For agitation Patient not taking: Reported on 03/16/2022 01/11/17   Armandina Stammer I, NP  hydrOXYzine (ATARAX/VISTARIL) 25 MG tablet Take 1 tablet (25 mg total) by mouth every 6 (six) hours as needed for anxiety. Patient not taking: Reported on 03/16/2022 01/11/17   Armandina Stammer I, NP  Multiple Vitamin (MULTIVITAMIN WITH MINERALS) TABS tablet Take 1 tablet by mouth daily. For low vitamin Patient not taking: Reported on 03/16/2022 01/12/17   Armandina Stammer I, NP  omeprazole (PRILOSEC) 40 MG capsule TAKE ONE CAPSULE BY MOUTH EVERY MORNING daily: For acid reflux Patient not taking: Reported on 03/16/2022 01/11/17   Armandina Stammer I, NP  traZODone (DESYREL) 50 MG tablet Take 1 tablet (50 mg total) by mouth at bedtime as needed for sleep. Patient not taking: Reported on 03/16/2022 01/11/17   Sanjuana Kava, NP     Critical  care time:    Royston Bake, NP-S

## 2022-03-18 DIAGNOSIS — N179 Acute kidney failure, unspecified: Secondary | ICD-10-CM

## 2022-03-18 DIAGNOSIS — T1491XA Suicide attempt, initial encounter: Secondary | ICD-10-CM | POA: Diagnosis not present

## 2022-03-18 DIAGNOSIS — J9601 Acute respiratory failure with hypoxia: Secondary | ICD-10-CM | POA: Diagnosis not present

## 2022-03-18 DIAGNOSIS — T40601A Poisoning by unspecified narcotics, accidental (unintentional), initial encounter: Secondary | ICD-10-CM

## 2022-03-18 DIAGNOSIS — T40604A Poisoning by unspecified narcotics, undetermined, initial encounter: Secondary | ICD-10-CM | POA: Diagnosis not present

## 2022-03-18 LAB — CBC
HCT: 39.3 % (ref 39.0–52.0)
Hemoglobin: 13.1 g/dL (ref 13.0–17.0)
MCH: 31.3 pg (ref 26.0–34.0)
MCHC: 33.3 g/dL (ref 30.0–36.0)
MCV: 94 fL (ref 80.0–100.0)
Platelets: 195 10*3/uL (ref 150–400)
RBC: 4.18 MIL/uL — ABNORMAL LOW (ref 4.22–5.81)
RDW: 12.8 % (ref 11.5–15.5)
WBC: 26.2 10*3/uL — ABNORMAL HIGH (ref 4.0–10.5)
nRBC: 0 % (ref 0.0–0.2)

## 2022-03-18 LAB — MAGNESIUM: Magnesium: 1.6 mg/dL — ABNORMAL LOW (ref 1.7–2.4)

## 2022-03-18 LAB — BASIC METABOLIC PANEL
Anion gap: 8 (ref 5–15)
BUN: 8 mg/dL (ref 6–20)
CO2: 23 mmol/L (ref 22–32)
Calcium: 8.2 mg/dL — ABNORMAL LOW (ref 8.9–10.3)
Chloride: 103 mmol/L (ref 98–111)
Creatinine, Ser: 0.72 mg/dL (ref 0.61–1.24)
GFR, Estimated: >60 mL/min (ref >60)
Glucose, Bld: 102 mg/dL — ABNORMAL HIGH (ref 70–99)
Potassium: 3.6 mmol/L (ref 3.5–5.1)
Sodium: 134 mmol/L — ABNORMAL LOW (ref 135–145)

## 2022-03-18 LAB — PHOSPHORUS: Phosphorus: 1.3 mg/dL — ABNORMAL LOW (ref 2.5–4.6)

## 2022-03-18 LAB — CULTURE, BLOOD (ROUTINE X 2)

## 2022-03-18 MED ORDER — K PHOS MONO-SOD PHOS DI & MONO 155-852-130 MG PO TABS
250.0000 mg | ORAL_TABLET | Freq: Once | ORAL | Status: AC
  Administered 2022-03-18: 250 mg via ORAL
  Filled 2022-03-18: qty 1

## 2022-03-18 MED ORDER — MAGNESIUM SULFATE 2 GM/50ML IV SOLN
2.0000 g | Freq: Once | INTRAVENOUS | Status: AC
  Administered 2022-03-18: 2 g via INTRAVENOUS
  Filled 2022-03-18: qty 50

## 2022-03-18 NOTE — Consult Note (Signed)
[facility] Face-to-Face Psychiatry Consult   Reason for Consult:  suicide attempt Referring Physician:  NP Mikey Bussing  Patient Identification: Jared [name] MRN:  [id number] Principal Diagnosis: Intentional heroin overdose [facility]) Diagnosis:  Principal Problem:   Intentional heroin overdose (HCC) Active Problems:   Polysubstance dependence including opioid type drug, continuous use (HCC)   Aspiration pneumonia (HCC)   Acute hypoxic respiratory failure (HCC)   Septic shock (HCC)   Alteration in electrolyte and fluid balance   ARDS (adult respiratory distress syndrome) (HCC)   Jared [name] is a 41 y.o. male patient with a psychiatric history of polysubstance abuse and substance-induced mood disorder, admitted with intentional overdose on heroin, xanax, alcohol.  Psychiatry saw patient for follow-up today.   HPI:  Patient was transferred from ICU to medical floor. Patient reports "I am here. All the same". He continues to report feeling "down", depressed, empty, hopeless, unmotivated; has no interest or pleasure in activities, low self-esteem, feeling of worthlessness. He denies thoughts of suicide today, denies thoughts of harming others.Patient denies any hallucinations. No side effects from restarted Vraylar. Patient does not express any delusions. He is still in agreement with inpatient psych transfer; hopes to get SW help with addiction treatment and some other social resources.  Past Psychiatric History:  Dx: opioid use d/o, benzodiazepine use d/o, alcohol use d/o, substance-induced mood disorder. Reports h/o past suicidal attempts via OD on drugs. H/o past psych admissions; last 2018. Was at [facility] for Tx of addiction. No recent psych medications. Past psych medications: Vraylar, Gabapentin, Trazodone.   Past Medical History:  Past Medical History:  Diagnosis Date   Asthma    Gastric ulcer    Hepatitis C    Hypertension    No past surgical history on file. Family  History: No family history on file.  Social History:  Social History   Substance and Sexual Activity  Alcohol Use No     Social History   Substance and Sexual Activity  Drug Use Yes   Types: IV   Comment: herion    Social History   Socioeconomic History   Marital status: Single    Spouse name: Not on file   Number of children: Not on file   Years of education: Not on file   Highest education level: Not on file  Occupational History   Not on file  Tobacco Use   Smoking status: Former    Types: Cigarettes   Smokeless tobacco: Never  Substance and Sexual Activity   Alcohol use: No   Drug use: Yes    Types: IV    Comment: herion   Sexual activity: Not on file  Other Topics Concern   Not on file  Social History Narrative   Not on file   Social Determinants of Health   Financial Resource Strain: Not on file  Food Insecurity: Not on file  Transportation Needs: Not on file  Physical Activity: Not on file  Stress: Not on file  Social Connections: Not on file   Additional Social History:    Allergies:   Allergies  Allergen Reactions   Tylenol [Acetaminophen] Rash    Labs:  Results for orders placed or performed during the hospital encounter of 03/16/22 (from the past 48 hour(s))  CBC with Differential     Status: Abnormal   Collection Time: 03/16/22 11:55 AM  Result Value Ref Range   WBC 11.2 (H) 4.0 - 10.5 K/uL   RBC 5.52 4.22 - 5.81 MIL/uL   Hemoglobin  17.5 (H) 13.0 - 17.0 g/dL   HCT 09.6 (H) 28.3 - 66.2 %   MCV 94.4 80.0 - 100.0 fL   MCH 31.7 26.0 - 34.0 pg   MCHC 33.6 30.0 - 36.0 g/dL   RDW 94.7 65.4 - 65.0 %   Platelets 261 150 - 400 K/uL   nRBC 0.0 0.0 - 0.2 %   Neutrophils Relative % 88 %   Neutro Abs 9.8 (H) 1.7 - 7.7 K/uL   Lymphocytes Relative 6 %   Lymphs Abs 0.7 0.7 - 4.0 K/uL   Monocytes Relative 6 %   Monocytes Absolute 0.6 0.1 - 1.0 K/uL   Eosinophils Relative 0 %   Eosinophils Absolute 0.0 0.0 - 0.5 K/uL   Basophils Relative 0 %    Basophils Absolute 0.0 0.0 - 0.1 K/uL   Immature Granulocytes 0 %   Abs Immature Granulocytes 0.03 0.00 - 0.07 K/uL    Comment: Performed at [facility] Lab, [address], [place], [place] [address]  Comprehensive metabolic panel     Status: Abnormal   Collection Time: 03/16/22 11:55 AM  Result Value Ref Range   Sodium 135 135 - 145 mmol/L   Potassium 4.3 3.5 - 5.1 mmol/L   Chloride 102 98 - 111 mmol/L   CO2 25 22 - 32 mmol/L   Glucose, Bld 116 (H) 70 - 99 mg/dL    Comment: Glucose reference range applies only to samples taken after fasting for at least 8 hours.   BUN 20 6 - 20 mg/dL   Creatinine, Ser 6.81 0.61 - 1.24 mg/dL   Calcium 8.5 (L) 8.9 - 10.3 mg/dL   Total Protein 6.3 (L) 6.5 - 8.1 g/dL   Albumin 3.3 (L) 3.5 - 5.0 g/dL   AST 37 15 - 41 U/L   ALT 32 0 - 44 U/L   Alkaline Phosphatase 71 38 - 126 U/L   Total Bilirubin 0.8 0.3 - 1.2 mg/dL   GFR, Estimated >27 >51 mL/min    Comment: (NOTE) Calculated using the CKD-EPI Creatinine Equation (2021)    Anion gap 8 5 - 15    Comment: Performed at [facility] Lab, [address], [place], [place] [address]  Magnesium     Status: Abnormal   Collection Time: 03/16/22 11:55 AM  Result Value Ref Range   Magnesium 1.6 (L) 1.7 - 2.4 mg/dL    Comment: Performed at [facility], [address] [place], [place] [address]  Ethanol     Status: None   Collection Time: 03/16/22 11:55 AM  Result Value Ref Range   Alcohol, Ethyl (B) <10 <10 mg/dL    Comment: (NOTE) Lowest detectable limit for serum alcohol is 10 mg/dL.  For medical purposes only. Performed at [facility], [address] [place], [place] [address]   Acetaminophen level     Status: Abnormal   Collection Time: 03/16/22 11:55 AM  Result Value Ref Range   Acetaminophen (Tylenol), Serum <10 (L) 10 - 30 ug/mL    Comment: (NOTE) Therapeutic concentrations vary significantly. A range of 10-30 ug/mL  may be an effective concentration for many  patients. However, some  are best treated at concentrations outside of this range. Acetaminophen concentrations >150 ug/mL at 4 hours after ingestion  and >50 ug/mL at 12 hours after ingestion are often associated with  toxic reactions.  Performed at [facility], [address], [place], [place] [address]   Salicylate level  Status: Abnormal   Collection Time: 03/16/22 11:55 AM  Result Value Ref Range   Salicylate Lvl <7.0 (L) 7.0 - 30.0 mg/dL    Comment: Performed at [facility] Lab, [address], [place], [place] [address]  Protime-INR     Status: None   Collection Time: 03/16/22 11:55 AM  Result Value Ref Range   Prothrombin Time 13.1 11.4 - 15.2 seconds   INR 1.0 0.8 - 1.2    Comment: (NOTE) INR goal varies based on device and disease states. Performed at [facility] Lab, [address], [place], [place] [address]   APTT     Status: None   Collection Time: 03/16/22 11:55 AM  Result Value Ref Range   aPTT 27 24 - 36 seconds    Comment: Performed at [facility] Lab, [address] [place], [place] [address]  CK     Status: None   Collection Time: 03/16/22 11:55 AM  Result Value Ref Range   Total CK 266 49 - 397 U/L    Comment: Performed at [facility], [address], [place], [place] [address]  Troponin I (High Sensitivity)     Status: Abnormal   Collection Time: 03/16/22 11:55 AM  Result Value Ref Range   Troponin I (High Sensitivity) 307 (HH) <18 ng/L    Comment: CRITICAL RESULT CALLED TO, READ BACK BY AND VERIFIED WITH J.Merri Brunette RN 2130 03/16/22 MCCORMICK K (NOTE) Elevated high sensitivity troponin I (hsTnI) values and significant  changes across serial measurements may suggest ACS but many other  chronic and acute conditions are known to elevate hsTnI results.  Refer to the "Links" section for chest pain algorithms and additional  guidance. Performed at [facility] Lab, [address], [place], [place] [address]   Blood  culture (routine x 2)     Status: None (Preliminary result)   Collection Time: 03/16/22 12:02 PM   Specimen: BLOOD LEFT FOREARM  Result Value Ref Range   Specimen Description BLOOD LEFT FOREARM    Special Requests      BOTTLES DRAWN AEROBIC AND ANAEROBIC Blood Culture results may not be optimal due to an excessive volume of blood received in culture bottles   Culture      NO GROWTH 2 DAYS Performed at [facility] Lab, [address], [place], [place] [address]    Report Status PENDING   Blood culture (routine x 2)     Status: None (Preliminary result)   Collection Time: 03/16/22 12:07 PM   Specimen: BLOOD LEFT FOREARM  Result Value Ref Range   Specimen Description BLOOD LEFT FOREARM    Special Requests      BOTTLES DRAWN AEROBIC ONLY Blood Culture results may not be optimal due to an inadequate volume of blood received in culture bottles   Culture      NO GROWTH 2 DAYS Performed at [facility] Lab, [address], [place], [place] [address]    Report Status PENDING   I-Stat venous blood gas, ([facility], [facility], [facility])     Status: Abnormal   Collection Time: 03/16/22 12:18 PM  Result Value Ref Range   pH, Ven 7.308 7.25 - 7.43   pCO2, Ven 57.3 44 - 60 mmHg   pO2, Ven 76 (H) 32 - 45 mmHg   Bicarbonate 28.7 (H) 20.0 - 28.0 mmol/L   TCO2 30 22 - 32 mmol/L   O2 Saturation 93 %   Acid-Base Excess 1.0 0.0 -  2.0 mmol/L   Sodium 137 135 - 145 mmol/L   Potassium 4.3 3.5 - 5.1 mmol/L   Calcium, Ion 1.13 (L) 1.15 - 1.40 mmol/L   HCT 52.0 39.0 - 52.0 %   Hemoglobin 17.7 (H) 13.0 - 17.0 g/dL   Sample type VENOUS   Lactic acid, plasma     Status: Abnormal   Collection Time: 03/16/22 12:28 PM  Result Value Ref Range   Lactic Acid, Venous 2.4 (HH) 0.5 - 1.9 mmol/L    Comment: CRITICAL RESULT CALLED TO, READ BACK BY AND VERIFIED WITH J.Bernarda Caffey RN 1610 03/16/22 MCCORMICK K Performed at [facility] Lab, [address] [place], [place] [address]   POC CBG, ED     Status: Abnormal    Collection Time: 03/16/22  1:03 PM  Result Value Ref Range   Glucose-Capillary 118 (H) 70 - 99 mg/dL    Comment: Glucose reference range applies only to samples taken after fasting for at least 8 hours.  Lactic acid, plasma     Status: Abnormal   Collection Time: 03/16/22  4:36 PM  Result Value Ref Range   Lactic Acid, Venous 4.9 (HH) 0.5 - 1.9 mmol/L    Comment: CRITICAL VALUE NOTED. VALUE IS CONSISTENT WITH PREVIOUSLY REPORTED/CALLED VALUE Performed at [facility] Lab, [address] [place], [place] [address]   HIV Antibody (routine testing w rflx)     Status: None   Collection Time: 03/16/22  4:36 PM  Result Value Ref Range   HIV Screen 4th Generation wRfx Non Reactive Non Reactive    Comment: Performed at [facility] Lab, [address] [place], [place] [address]  Procalcitonin     Status: None   Collection Time: 03/16/22  4:36 PM  Result Value Ref Range   Procalcitonin 28.84 ng/mL    Comment:        Interpretation: PCT >= 10 ng/mL: Important systemic inflammatory response, almost exclusively due to severe bacterial sepsis or septic shock. (NOTE)       Sepsis PCT Algorithm           Lower Respiratory Tract                                      Infection PCT Algorithm    ----------------------------     ----------------------------         PCT < 0.25 ng/mL                PCT < 0.10 ng/mL          Strongly encourage             Strongly discourage   discontinuation of antibiotics    initiation of antibiotics    ----------------------------     -----------------------------       PCT 0.25 - 0.50 ng/mL            PCT 0.10 - 0.25 ng/mL               OR       >80% decrease in PCT            Discourage initiation of                                            antibiotics  Encourage discontinuation           of antibiotics    ----------------------------     -----------------------------         PCT >= 0.50 ng/mL              PCT 0.26 - 0.50 ng/mL                 AND       <80% decrease in PCT             Encourage initiation of                                             antibiotics       Encourage continuation           of antibiotics    ----------------------------     -----------------------------        PCT >= 0.50 ng/mL                  PCT > 0.50 ng/mL               AND         increase in PCT                  Strongly encourage                                      initiation of antibiotics    Strongly encourage escalation           of antibiotics                                     -----------------------------                                           PCT <= 0.25 ng/mL                                                 OR                                        > 80% decrease in PCT                                      Discontinue / Do not initiate                                             antibiotics  Performed at [facility] Lab, [address], [place], [place] [address]   Glucose, capillary     Status: Abnormal   Collection Time: 03/16/22  7:18 PM  Result Value Ref Range   Glucose-Capillary 145 (H) 70 - 99 mg/dL    Comment: Glucose reference range applies only to samples taken after fasting for at least 8 hours.  MRSA Next Gen by PCR, Nasal     Status: Abnormal   Collection Time: 03/16/22  7:25 PM   Specimen: Nasal Mucosa; Nasal Swab  Result Value Ref Range   MRSA by PCR Next Gen DETECTED (A) NOT DETECTED    Comment: RESULT CALLED TO, READ BACK BY AND VERIFIED WITH:  C/ S. SAVAGE, RN 03/16/22 2252 A. LAFRANCE (NOTE) The GeneXpert MRSA Assay (FDA approved for NASAL specimens only), is one component of a comprehensive MRSA colonization surveillance program. It is not intended to diagnose MRSA infection nor to guide or monitor treatment for MRSA infections. Test performance is not FDA approved in patients less than 69 years old. Performed at [facility] Lab, [address], [place], [place] [address]   Troponin I  (High Sensitivity)     Status: Abnormal   Collection Time: 03/16/22  9:24 PM  Result Value Ref Range   Troponin I (High Sensitivity) 387 (HH) <18 ng/L    Comment: CRITICAL VALUE NOTED. VALUE IS CONSISTENT WITH PREVIOUSLY REPORTED/CALLED VALUE (NOTE) Elevated high sensitivity troponin I (hsTnI) values and significant  changes across serial measurements may suggest ACS but many other  chronic and acute conditions are known to elevate hsTnI results.  Refer to the "Links" section for chest pain algorithms and additional  guidance. Performed at [facility] Lab, [address], [place], [place] [address]   CBC     Status: Abnormal   Collection Time: 03/17/22  5:56 AM  Result Value Ref Range   WBC 27.1 (H) 4.0 - 10.5 K/uL   RBC 4.56 4.22 - 5.81 MIL/uL   Hemoglobin 14.3 13.0 - 17.0 g/dL   HCT 09.8 11.9 - 14.7 %   MCV 94.5 80.0 - 100.0 fL   MCH 31.4 26.0 - 34.0 pg   MCHC 33.2 30.0 - 36.0 g/dL   RDW 82.9 56.2 - 13.0 %   Platelets 220 150 - 400 K/uL   nRBC 0.0 0.0 - 0.2 %    Comment: Performed at [facility], [address] [place], [place] [address]  Basic metabolic panel     Status: Abnormal   Collection Time: 03/17/22  5:56 AM  Result Value Ref Range   Sodium 136 135 - 145 mmol/L   Potassium 4.0 3.5 - 5.1 mmol/L   Chloride 101 98 - 111 mmol/L   CO2 23 22 - 32 mmol/L   Glucose, Bld 113 (H) 70 - 99 mg/dL    Comment: Glucose reference range applies only to samples taken after fasting for at least 8 hours.   BUN 15 6 - 20 mg/dL   Creatinine, Ser 4.69 0.61 - 1.24 mg/dL   Calcium 8.2 (L) 8.9 - 10.3 mg/dL   GFR, Estimated >62 >95 mL/min    Comment: (NOTE) Calculated using the CKD-EPI Creatinine Equation (2021)    Anion gap 12 5 - 15    Comment: Performed at [facility] Lab, [address], [place], [place] [address]  Phosphorus     Status: Abnormal   Collection Time: 03/17/22  5:56 AM  Result Value Ref Range   Phosphorus 2.4 (L) 2.5 - 4.6 mg/dL    Comment: Performed  at [facility], [address] [place], [place] [address]  Magnesium  Status: Abnormal   Collection Time: 03/17/22  5:56 AM  Result Value Ref Range   Magnesium 1.5 (L) 1.7 - 2.4 mg/dL    Comment: Performed at [facility] Lab, [address], [place], [place] [address]  Glucose, capillary     Status: Abnormal   Collection Time: 03/17/22  8:40 PM  Result Value Ref Range   Glucose-Capillary 129 (H) 70 - 99 mg/dL    Comment: Glucose reference range applies only to samples taken after fasting for at least 8 hours.  CBC     Status: Abnormal   Collection Time: 03/18/22  3:46 AM  Result Value Ref Range   WBC 26.2 (H) 4.0 - 10.5 K/uL   RBC 4.18 (L) 4.22 - 5.81 MIL/uL   Hemoglobin 13.1 13.0 - 17.0 g/dL   HCT 57.8 46.9 - 62.9 %   MCV 94.0 80.0 - 100.0 fL   MCH 31.3 26.0 - 34.0 pg   MCHC 33.3 30.0 - 36.0 g/dL   RDW 52.8 41.3 - 24.4 %   Platelets 195 150 - 400 K/uL   nRBC 0.0 0.0 - 0.2 %    Comment: Performed at [facility] Lab, [address], [place], [place] [address]  Basic metabolic panel     Status: Abnormal   Collection Time: 03/18/22  3:46 AM  Result Value Ref Range   Sodium 134 (L) 135 - 145 mmol/L   Potassium 3.6 3.5 - 5.1 mmol/L   Chloride 103 98 - 111 mmol/L   CO2 23 22 - 32 mmol/L   Glucose, Bld 102 (H) 70 - 99 mg/dL    Comment: Glucose reference range applies only to samples taken after fasting for at least 8 hours.   BUN 8 6 - 20 mg/dL   Creatinine, Ser 2.53 0.61 - 1.24 mg/dL   Calcium 8.2 (L) 8.9 - 10.3 mg/dL   GFR, Estimated >66 >44 mL/min    Comment: (NOTE) Calculated using the CKD-EPI Creatinine Equation (2021)    Anion gap 8 5 - 15    Comment: Performed at [facility] Lab, [address] [place], [place] [address]  Magnesium     Status: Abnormal   Collection Time: 03/18/22  3:46 AM  Result Value Ref Range   Magnesium 1.6 (L) 1.7 - 2.4 mg/dL    Comment: Performed at [facility] Lab, [address] [place], [place] [address]  Phosphorus      Status: Abnormal   Collection Time: 03/18/22  3:46 AM  Result Value Ref Range   Phosphorus 1.3 (L) 2.5 - 4.6 mg/dL    Comment: Performed at [facility] Lab, [address], [place], [place] [address]    Current Facility-Administered Medications  Medication Dose Route Frequency Provider Last Rate Last Admin   0.9 %  sodium chloride infusion  250 mL Intravenous Continuous Simonne Martinet, NP       albuterol (PROVENTIL) (2.5 MG/3ML) 0.083% nebulizer solution 2.5 mg  2.5 mg Nebulization Q4H PRN Simonne Martinet, NP       Ampicillin-Sulbactam (UNASYN) 3 g in sodium chloride 0.9 % 100 mL IVPB  3 g Intravenous Q6H Francena Hanly, RPH 200 mL/hr at 03/18/22 0600 Infusion Verify at 03/18/22 0600   cariprazine (VRAYLAR) capsule 1.5 mg  1.5 mg Oral Daily Olalere, Adewale A, MD   1.5 mg at 03/18/22 0913   enoxaparin (LOVENOX) injection 40 mg  40 mg Subcutaneous Q24H Olalere, Adewale A, MD   40 mg  at 03/17/22 1311   folic acid 1 mg in sodium chloride 0.9 % 50 mL IVPB  1 mg Intravenous Daily Olalere, Adewale A, MD 100.4 mL/hr at 03/18/22 0922 1 mg at 03/18/22 5189   LORazepam (ATIVAN) injection 1 mg  1 mg Intravenous Q4H PRN Francena Hanly, RPH   1 mg at 03/17/22 1345   mupirocin ointment (BACTROBAN) 2 % 1 Application  1 Application Nasal BID Virl Diamond A, MD   1 Application at 03/18/22 0914   ondansetron (ZOFRAN) injection 4 mg  4 mg Intravenous Q6H PRN Simonne Martinet, NP       PHENObarbital (LUMINAL) injection 65 mg  65 mg Intravenous Q8H Francena Hanly, RPH       Followed by   Melene Muller ON 03/20/2022] PHENObarbital (LUMINAL) injection 32.5 mg  32.5 mg Intravenous Q8H Wilson, Heather W, RPH       polyethylene glycol (MIRALAX / GLYCOLAX) packet 17 g  17 g Oral Daily PRN Simonne Martinet, NP       thiamine (VITAMIN B1) injection 100 mg  100 mg Intravenous Daily Olalere, Adewale A, MD   100 mg at 03/18/22 0913   vancomycin (VANCOREADY) IVPB 1250 mg/250 mL  1,250 mg Intravenous Q12H Simonne Martinet, NP 166.7 mL/hr at 03/18/22 0600 Infusion Verify at 03/18/22 0600    Musculoskeletal: Strength & Muscle Tone: decreased Gait & Station:  n/a Patient leans: N/A   Psychiatric Specialty Exam:  Appearance:  CM in ICU bed, appearing stated age,  wearing hospital clothes, with decreased grooming.  Attitude/Behavior: calm, cooperative, engaging with appropriate eye contact.  Motor: dyskinesias not evident.   Speech: spontaneous, clear, coherent, normal comprehension.  Mood: dysthymic, "depressed".  Affect: restricted  Thought process: patient appears coherent, organized, goal-directed.  Thought content: patient denies suicidal thoughts today, denies homicidal thoughts; did not express any delusions.  Thought perception: patient denies auditory and visual hallucinations. Did not appear internally stimulated.  Cognition: patient is alert and oriented in self, place, date.  Insight: limited  Judgement: poor  Physical Exam: Physical Exam ROS Blood pressure (!) 136/90, pulse 86, temperature 98.5 F (36.9 C), temperature source Oral, resp. rate 20, height 5\' 9"  (1.753 m), weight 82.5 kg, SpO2 98 %. Body mass index is 26.86 kg/m.   Assessment: 41 y.o. male patient with a psychiatric history of polysubstance abuse and substance-induced mood disorder, admitted with intentional overdose on heroin, xanax, alcohol. Currently in ICU with septic shock. Patient continues to report feeling deeply depressed in settings of life stressors, and is still hopeless, although not acutely suicidal today. Patient cannot contract for safety if discharged, so we continue to recommended him for an inpatient psychiatric admission.  Recommendations -1:1 sitter 24/7, do not discharge even [facility] clothing only, no personal belongings in room -When "medically clear", primary team should contact TOC to facilitate transfer to inpatient psychiatric facility. -COWS-protocol for acute opioid  withdrawal, CIWA-protocol for acute alcohol and benzo withdrawal. -primary team restarted Vraylar 1.5mg  po daily for mood stabilization - ok to continue as patient reports good effect in the past. -Psychiatry will follow while here.  Treatment Plan Summary: Daily contact with patient to assess and evaluate symptoms and progress in treatment and Medication management  Disposition: Recommend psychiatric Inpatient admission when medically cleared.  [facility], MD 03/18/2022 11:14 AM

## 2022-03-18 NOTE — Progress Notes (Signed)
PROGRESS NOTE  STONE [name]    DOB: 1980/05/16, 41 y.o.  BLT:[id number]    Code Status: Full Code   DOA: 03/16/2022   LOS: 2   Brief hospital course  Jared [name] is a 41 y.o. male with a PMH significant for HTN, Asthma, Hep C, polysubstance dependence including opioid type drugs, substance induced mood disorder, and hx of prior overdose.  He presented to the ED by EMS 12/22 after pt mother found him down (unknown downtime) next to Heroin, Xanax, and Wine. EMS gave Narcan with positive response, and arrived to the ED with Spo2 in the 70's, placed on NRB. Pt was alert to stimuli and had vomit on his shirt. Pt respiratory status continued to decompensate leading to needing Heated HFNC to maintain Spo2 >90%. Pt also became hypotensive (71/55), requiring NE in addition to 3L fluid boluses.    ED workup reveled WBC 11.2, Trop 307, LA 2.4, Blood Cultures Pending, CXR showed diffuse alveolar densities seen in both lungs, more so in right lower lung field suggesting multifocal PNA. He presented with a rectal temp 101.15F.  03/18/22 -transferred to [facility]  Assessment & Plan  Principal Problem:   Intentional heroin overdose (HCC) Active Problems:   Aspiration pneumonia (HCC)   Acute hypoxic respiratory failure (HCC)   ARDS (adult respiratory distress syndrome) (HCC)   Alteration in electrolyte and fluid balance   Polysubstance dependence including opioid type drug, continuous use (HCC)   Septic shock (HCC)  Septic Shock w/ Lactic Acidosis> likely secondary to aspiration PNA d/t overdose with unknown downtime. BxCx NGTD. Clinically improved - follow WBC, fever - Unasyn, Vanco  - dc vancomycin 12/24   Acute Respiratory Failure- in the setting of asp. PNA post overdose, requiring HHFNC now weaned to 2L [place] - wean to room air as tolerated. And then ambulate with pulse ox - continue unasyn   Hx of Asthma- not in acute exacerbation. No wheezing - PRN SABA   Elevated Troponin- likely  demand ischemia but also risk for endocarditis given IV drug abuse hx. Denies chest pain currently. Echo showing LVEF 30-35%, no comparison.  - awaiting cardiology consult   Possible Withdrawal > drinks a box of wine per week, last drink 12/21 - Phenobarb taper continue - CIWA, and COWA monitoring   Mood Disorder with Intentional Overdose: (patient now states no intent for self harm). He does have history of intentional overdose in 2018 for which he was admitted to [facility].  - psych consulted, appreciate recs  - plan to admit to inpatient psychiatric care after medically cleared  - 1:1 sitter - Resume home cariprazine (BID at home, can only order daily here) - Holding home gabapentin, hydroxyzine  Hypophosphatemia  hypomagnesemia  hyponatremia- mild - monitor and replete PRN  Body mass index is 26.86 kg/m.  VTE ppx: enoxaparin (LOVENOX) injection 40 mg Start: 03/17/22 1145 SCDs Start: 03/16/22 1528  Diet:     Diet   Diet regular Room service appropriate? Yes; Fluid consistency: Thin   Consultants: Psychiatry Cardiology   Subjective 03/18/22    Pt reports no complaints today. Denies chest pain, SOB. Feels tired overall. Does not have oxygen tubing in his nose at start of encounter.    Objective   Vitals:   03/18/22 0400 03/18/22 0424 03/18/22 0426 03/18/22 0600  BP:  (!) 136/90    Pulse:  86    Resp: (!) 22 18  20   Temp:  98.6 F (37 C)    TempSrc:  Oral    SpO2:  98%    Weight:   82.5 kg   Height:        Intake/Output Summary (Last 24 hours) at 03/18/2022 0819 Last data filed at 03/18/2022 9417 Gross per 24 hour  Intake 3874.04 ml  Output 2825 ml  Net 1049.04 ml   Filed Weights   03/16/22 1219 03/17/22 0500 03/18/22 0426  Weight: 84 kg 82 kg 82.5 kg     Physical Exam:  General: awake, alert, NAD HEENT: atraumatic, clear conjunctiva, anicteric sclera, MMM, hearing grossly normal Respiratory: normal respiratory effort. CTAB Cardiovascular:  quick capillary refill, normal S1/S2, RRR, no JVD, murmurs Gastrointestinal: soft, NT, ND Nervous: A&O x3. no gross focal neurologic deficits, normal speech Extremities: moves all equally, no edema, normal tone Skin: dry, intact, normal temperature, normal color. No rashes, lesions or ulcers on exposed skin Psychiatry: flat mood, congruent affect  Labs   I have personally reviewed the following labs and imaging studies CBC    Component Value Date/Time   WBC 26.2 (H) 03/18/2022 0346   RBC 4.18 (L) 03/18/2022 0346   HGB 13.1 03/18/2022 0346   HCT 39.3 03/18/2022 0346   PLT 195 03/18/2022 0346   MCV 94.0 03/18/2022 0346   MCH 31.3 03/18/2022 0346   MCHC 33.3 03/18/2022 0346   RDW 12.8 03/18/2022 0346   LYMPHSABS 0.7 03/16/2022 1155   MONOABS 0.6 03/16/2022 1155   EOSABS 0.0 03/16/2022 1155   BASOSABS 0.0 03/16/2022 1155      Latest Ref Rng & Units 03/18/2022    3:46 AM 03/17/2022    5:56 AM 03/16/2022   12:18 PM  BMP  Glucose 70 - 99 mg/dL 408  144    BUN 6 - 20 mg/dL 8  15    Creatinine 8.18 - 1.24 mg/dL 5.63  1.49    Sodium 702 - 145 mmol/L 134  136  137   Potassium 3.5 - 5.1 mmol/L 3.6  4.0  4.3   Chloride 98 - 111 mmol/L 103  101    CO2 22 - 32 mmol/L 23  23    Calcium 8.9 - 10.3 mg/dL 8.2  8.2      DG Chest Port 1 View  Result Date: 03/17/2022 CLINICAL DATA:  Evaluate for pneumonia. EXAM: PORTABLE CHEST 1 VIEW COMPARISON:  03/16/2022 FINDINGS: Heart size and mediastinal contours are stable. Bilateral airspace opacities are identified in appear unchanged from the previous exam. No pleural effusion or edema identified. Visualized osseous structures are unremarkable. IMPRESSION: No change in bilateral airspace opacities compatible with multifocal pneumonia. Electronically Signed   By: Signa Kell M.D.   On: 03/17/2022 06:23   ECHOCARDIOGRAM COMPLETE  Result Date: 03/16/2022    ECHOCARDIOGRAM REPORT   Patient Name:   Jared [name] Date of Exam: 03/16/2022  Medical Rec #:  [id number]         Height:       69.0 in Accession #:    [id number]        Weight:       185.2 lb Date of Birth:  01-Nov-1980         BSA:          2.000 m Patient Age:    41 years          BP:           108/88 mmHg Patient Gender: M  HR:           86 bpm. Exam Location:  Inpatient Procedure: 2D Echo Indications:    acute respiratory distress  History:        Patient has no prior history of Echocardiogram examinations.                 Risk Factors:polysubstance abuse.  Sonographer:    Delcie Roch RDCS Referring Phys: [id number] PETER E BABCOCK IMPRESSIONS  1. Left ventricular ejection fraction, by estimation, is 30 to 35%. The left ventricle has moderately decreased function. The left ventricle demonstrates global hypokinesis. Left ventricular diastolic parameters are indeterminate.  2. Right ventricular systolic function is mildly reduced. The right ventricular size is normal. There is normal pulmonary artery systolic pressure.  3. No evidence of mitral valve regurgitation.  4. The aortic valve is tricuspid. Aortic valve regurgitation is not visualized.  5. The inferior vena cava is dilated in size with >50% respiratory variability, suggesting right atrial pressure of 8 mmHg. Comparison(s): No prior Echocardiogram. FINDINGS  Left Ventricle: Left ventricular ejection fraction, by estimation, is 30 to 35%. The left ventricle has moderately decreased function. The left ventricle demonstrates global hypokinesis. The left ventricular internal cavity size was normal in size. There is no left ventricular hypertrophy. Left ventricular diastolic parameters are indeterminate. Right Ventricle: The right ventricular size is normal. Right ventricular systolic function is mildly reduced. There is normal pulmonary artery systolic pressure. The tricuspid regurgitant velocity is 1.79 m/s, and with an assumed right atrial pressure of  8 mmHg, the estimated right ventricular systolic pressure is 20.8  mmHg. Left Atrium: Left atrial size was normal in size. Right Atrium: Right atrial size was normal in size. Pericardium: There is no evidence of pericardial effusion. Mitral Valve: No evidence of mitral valve regurgitation. Tricuspid Valve: Tricuspid valve regurgitation is trivial. Aortic Valve: The aortic valve is tricuspid. Aortic valve regurgitation is not visualized. Pulmonic Valve: Pulmonic valve regurgitation is not visualized. Aorta: The aortic root and ascending aorta are structurally normal, with no evidence of dilitation. Venous: The inferior vena cava is dilated in size with greater than 50% respiratory variability, suggesting right atrial pressure of 8 mmHg. IAS/Shunts: No atrial level shunt detected by color flow Doppler.  LEFT VENTRICLE PLAX 2D LVIDd:         5.10 cm     Diastology LVIDs:         4.20 cm     LV e' medial:    6.74 cm/s LV PW:         1.00 cm     LV E/e' medial:  7.1 LV IVS:        0.90 cm     LV e' lateral:   12.40 cm/s LVOT diam:     2.50 cm     LV E/e' lateral: 3.9 LV SV:         55 LV SV Index:   27 LVOT Area:     4.91 cm                             3D Volume EF: LV Volumes (MOD)           3D EF:        33 % LV vol d, MOD A2C: 90.3 ml LV EDV:       139 ml LV vol s, MOD A2C: 70.6 ml LV ESV:  94 ml LV SV MOD A2C:     19.7 ml LV SV:        46 ml RIGHT VENTRICLE             IVC RV Basal diam:  3.10 cm     IVC diam: 2.30 cm RV S prime:     11.40 cm/s TAPSE (M-mode): 1.6 cm LEFT ATRIUM             Index        RIGHT ATRIUM           Index LA diam:        3.40 cm 1.70 cm/m   RA Area:     13.60 cm LA Vol (A2C):   33.4 ml 16.70 ml/m  RA Volume:   36.20 ml  18.10 ml/m LA Vol (A4C):   40.1 ml 20.05 ml/m LA Biplane Vol: 38.8 ml 19.40 ml/m  AORTIC VALVE LVOT Vmax:   84.20 cm/s LVOT Vmean:  52.700 cm/s LVOT VTI:    0.112 m  AORTA Ao Root diam: 3.20 cm Ao Asc diam:  3.00 cm MITRAL VALVE               TRICUSPID VALVE MV Area (PHT): 4.68 cm    TR Peak grad:   12.8 mmHg MV Decel Time: 162  msec    TR Vmax:        179.00 cm/s MV E velocity: 47.90 cm/s MV A velocity: 33.10 cm/s  SHUNTS MV E/A ratio:  1.45        Systemic VTI:  0.11 m                            Systemic Diam: 2.50 cm Carolan Clines Electronically signed by Carolan Clines Signature Date/Time: 03/16/2022/4:35:56 PM    Final    DG Chest Portable 1 View  Result Date: 03/16/2022 CLINICAL DATA:  Hypoxia, tachypnea EXAM: PORTABLE CHEST 1 VIEW COMPARISON:  08/06/2013 FINDINGS: Transverse diameter of heart is increased. Diffuse alveolar densities are seen in both parahilar regions and both lower lung fields. Left lateral CP angle is indistinct. There is no pneumothorax. IMPRESSION: Diffuse alveolar densities seen in both lungs, more so in right lower lung field. Findings suggest multifocal pneumonia. Possibility of superimposed pulmonary edema is not excluded. Possible minimal left pleural effusion. Electronically Signed   By: Ernie Avena M.D.   On: 03/16/2022 13:00    Disposition Plan & Communication  Patient status: Inpatient  Admitted From: Home Planned disposition location: psychiatric admission Anticipated discharge date: 12/25 pending clinical improvement, transition to PO Abx  Family Communication: none    Author: Leeroy Bock, DO [facility] 03/18/2022, 8:19 AM   Available by Epic secure chat 7AM-7PM. If 7PM-7AM, please contact night-coverage.  [facility] contact information found on [email or web address].

## 2022-03-19 ENCOUNTER — Inpatient Hospital Stay (HOSPITAL_COMMUNITY): Payer: BC Managed Care – PPO

## 2022-03-19 DIAGNOSIS — F1120 Opioid dependence, uncomplicated: Secondary | ICD-10-CM

## 2022-03-19 DIAGNOSIS — F1920 Other psychoactive substance dependence, uncomplicated: Secondary | ICD-10-CM

## 2022-03-19 DIAGNOSIS — J9601 Acute respiratory failure with hypoxia: Secondary | ICD-10-CM | POA: Diagnosis not present

## 2022-03-19 DIAGNOSIS — T40604A Poisoning by unspecified narcotics, undetermined, initial encounter: Secondary | ICD-10-CM | POA: Diagnosis not present

## 2022-03-19 LAB — BASIC METABOLIC PANEL
Anion gap: 8 (ref 5–15)
BUN: 6 mg/dL (ref 6–20)
CO2: 22 mmol/L (ref 22–32)
Calcium: 8 mg/dL — ABNORMAL LOW (ref 8.9–10.3)
Chloride: 105 mmol/L (ref 98–111)
Creatinine, Ser: 0.82 mg/dL (ref 0.61–1.24)
GFR, Estimated: >60 mL/min (ref >60)
Glucose, Bld: 133 mg/dL — ABNORMAL HIGH (ref 70–99)
Potassium: 3.4 mmol/L — ABNORMAL LOW (ref 3.5–5.1)
Sodium: 135 mmol/L (ref 135–145)

## 2022-03-19 LAB — RESPIRATORY PANEL BY PCR

## 2022-03-19 LAB — CBC
HCT: 39.7 % (ref 39.0–52.0)
HCT: 38.7 % — ABNORMAL LOW (ref 39.0–52.0)
Hemoglobin: 14 g/dL (ref 13.0–17.0)
Hemoglobin: 13.9 g/dL (ref 13.0–17.0)
MCH: 32 pg (ref 26.0–34.0)
MCH: 32.1 pg (ref 26.0–34.0)
MCHC: 35.3 g/dL (ref 30.0–36.0)
MCHC: 35.9 g/dL (ref 30.0–36.0)
MCV: 90.8 fL (ref 80.0–100.0)
MCV: 89.4 fL (ref 80.0–100.0)
Platelets: 199 10*3/uL (ref 150–400)
Platelets: 200 10*3/uL (ref 150–400)
RBC: 4.37 MIL/uL (ref 4.22–5.81)
RBC: 4.33 MIL/uL (ref 4.22–5.81)
RDW: 12.3 % (ref 11.5–15.5)
RDW: 12.1 % (ref 11.5–15.5)
WBC: 25.8 10*3/uL — ABNORMAL HIGH (ref 4.0–10.5)
WBC: 26.8 10*3/uL — ABNORMAL HIGH (ref 4.0–10.5)
nRBC: 0 % (ref 0.0–0.2)
nRBC: 0 % (ref 0.0–0.2)

## 2022-03-19 LAB — BLOOD GAS, ARTERIAL
Acid-Base Excess: 1.3 mmol/L (ref 0.0–2.0)
Bicarbonate: 24.7 mmol/L (ref 20.0–28.0)
O2 Saturation: 99.2 %
Patient temperature: 39.6
pCO2 arterial: 38 mmHg (ref 32–48)
pH, Arterial: 7.43 (ref 7.35–7.45)
pO2, Arterial: 92 mmHg (ref 83–108)

## 2022-03-19 LAB — RESP PANEL BY RT-PCR (RSV, FLU A&B, COVID)  RVPGX2
Influenza A by PCR: NEGATIVE
Influenza B by PCR: NEGATIVE
Resp Syncytial Virus by PCR: POSITIVE — AB
SARS Coronavirus 2 by RT PCR: NEGATIVE

## 2022-03-19 LAB — TROPONIN I (HIGH SENSITIVITY): Troponin I (High Sensitivity): 88 ng/L — ABNORMAL HIGH (ref <18)

## 2022-03-19 LAB — CULTURE, BLOOD (ROUTINE X 2)

## 2022-03-19 LAB — LACTIC ACID, PLASMA: Lactic Acid, Venous: 1.2 mmol/L (ref 0.5–1.9)

## 2022-03-19 LAB — GLUCOSE, CAPILLARY: Glucose-Capillary: 117 mg/dL — ABNORMAL HIGH (ref 70–99)

## 2022-03-19 MED ORDER — LORAZEPAM 2 MG/ML IJ SOLN
2.0000 mg | INTRAMUSCULAR | Status: DC | PRN
  Administered 2022-03-19 – 2022-03-24 (×17): 2 mg via INTRAVENOUS
  Filled 2022-03-19 (×17): qty 1

## 2022-03-19 MED ORDER — MORPHINE SULFATE (PF) 2 MG/ML IV SOLN
2.0000 mg | INTRAVENOUS | Status: DC | PRN
  Administered 2022-03-19 – 2022-03-21 (×5): 2 mg via INTRAVENOUS
  Filled 2022-03-19 (×5): qty 1

## 2022-03-19 MED ORDER — VANCOMYCIN HCL 1500 MG/300ML IV SOLN
1500.0000 mg | Freq: Two times a day (BID) | INTRAVENOUS | Status: DC
  Administered 2022-03-19 – 2022-03-20 (×2): 1500 mg via INTRAVENOUS
  Filled 2022-03-19 (×2): qty 300

## 2022-03-19 MED ORDER — PIPERACILLIN-TAZOBACTAM 3.375 G IVPB
3.3750 g | Freq: Three times a day (TID) | INTRAVENOUS | Status: DC
  Administered 2022-03-19 – 2022-03-22 (×8): 3.375 g via INTRAVENOUS
  Filled 2022-03-19 (×8): qty 50

## 2022-03-19 MED ORDER — DM-GUAIFENESIN ER 30-600 MG PO TB12
1.0000 | ORAL_TABLET | Freq: Two times a day (BID) | ORAL | Status: DC | PRN
  Administered 2022-03-19 – 2022-03-22 (×4): 1 via ORAL
  Filled 2022-03-19 (×6): qty 1

## 2022-03-19 MED ORDER — POTASSIUM CHLORIDE CRYS ER 20 MEQ PO TBCR
40.0000 meq | EXTENDED_RELEASE_TABLET | Freq: Two times a day (BID) | ORAL | Status: DC
  Administered 2022-03-19: 40 meq via ORAL
  Filled 2022-03-19: qty 2

## 2022-03-19 MED ORDER — ORAL CARE MOUTH RINSE
15.0000 mL | OROMUCOSAL | Status: DC
  Administered 2022-03-20 – 2022-03-24 (×10): 15 mL via OROMUCOSAL

## 2022-03-19 MED ORDER — MENTHOL 3 MG MT LOZG
1.0000 | LOZENGE | OROMUCOSAL | Status: DC | PRN
  Administered 2022-03-19 – 2022-03-23 (×6): 3 mg via ORAL
  Filled 2022-03-19 (×3): qty 9

## 2022-03-19 MED ORDER — CHLORHEXIDINE GLUCONATE CLOTH 2 % EX PADS
6.0000 | MEDICATED_PAD | Freq: Every day | CUTANEOUS | Status: DC
  Administered 2022-03-19 – 2022-03-24 (×5): 6 via TOPICAL

## 2022-03-19 MED ORDER — AMOXICILLIN-POT CLAVULANATE 875-125 MG PO TABS
1.0000 | ORAL_TABLET | Freq: Two times a day (BID) | ORAL | Status: DC
  Administered 2022-03-19: 1 via ORAL
  Filled 2022-03-19: qty 1

## 2022-03-19 MED ORDER — ORAL CARE MOUTH RINSE: 15.0000 mL | OROMUCOSAL | Status: DC | PRN

## 2022-03-19 MED ORDER — IBUPROFEN 200 MG PO TABS
400.0000 mg | ORAL_TABLET | Freq: Four times a day (QID) | ORAL | Status: DC | PRN
  Administered 2022-03-19 – 2022-03-21 (×3): 400 mg via ORAL
  Filled 2022-03-19: qty 2
  Filled 2022-03-19: qty 1
  Filled 2022-03-19: qty 2

## 2022-03-19 NOTE — Progress Notes (Signed)
NAME:  Jared [name], MRN:  [id number], DOB:  Sep 23, 1980, LOS: 3 ADMISSION DATE:  03/16/2022, CONSULTATION DATE:  03/16/2022 REFERRING MD:  Mayra Neer, MD, CHIEF COMPLAINT:  Septic Shock   History of Present Illness:  Jared [name] is a 41 y.o. male with a PMH HTN, Asthma, Hep C,  polysubstance dependence including opioid type drugs, substance induced mood disorder, and hx of prior overdose, He presented to the ED by EMS today after pt mother found him down (unknown downtime) next to Heroin, Xanax, and Wine. EMS gave Narcan with positive response, and arrived to the ED with Spo2 in the 70's, placed on NRB. Pt was alert to stimuli and had vomit on his shirt. Pt respiratory status continued to decompensate leading to needing Heated HFNC to maintain Spo2 >90%. Pt also became hypotensive (71/55), requiring NE post his 3L fluid boluses.   ED workup reveled WBC 11.2, Trop 307, LA 2.4, Blood Cultures Pending, CXR showed diffuse alveolar densities seen in both lungs, more so in right lower lung field suggesting multifocal PNA. He presented with a rectal temp 101.744F.   PCCM consulted in the setting of Septic Shock.   Pertinent  Medical History  polysubstance dependence including opioid type drugs, substance induced mood disorder, and hx of prior overdose, asthma, Hep C, HTN  Significant Hospital Events: Including procedures, antibiotic start and stop dates in addition to other pertinent events   12/22 - ED > 3L Fluid received Azithromycin/Ceftriaxone, Vanco, HHFNC, NE started > Admitted to ICU 12/25 Increased WOB, Tm 103.744F, transfer back to ICU and broaden ABx coverage, try on Bipap  Interim History / Subjective:  Increased WOB.  C/o of back and chest pain.  Tm 103.744F.    Objective   Blood pressure (!) 141/78, pulse 87, temperature (!) 103.3 F (39.6 C), temperature source Axillary, resp. rate (!) 54, height 5\' 9"  (1.753 m), weight 82.5 kg, SpO2 96 %.    FiO2 (%):  [40 %] 40 %    Intake/Output Summary (Last 24 hours) at 03/19/2022 2138 Last data filed at 03/19/2022 1900 Gross per 24 hour  Intake 850 ml  Output 3175 ml  Net -2325 ml   Filed Weights   03/16/22 1219 03/17/22 0500 03/18/22 0426  Weight: 84 kg 82 kg 82.5 kg   Physical Examination:  General - alert, anxious, increased WOB Eyes - pupils reactive ENT - no sinus tenderness, no stridor Cardiac - tachycardic, regular Chest - b/l rhonchi Abdomen - soft, non tender, + bowel sounds Extremities - no cyanosis, clubbing, or edema Skin - no rashes Neuro - normal strength, moves extremities, follows commands  CXR - diffuse b/l ASD  Resolved Hospital Problem list   Septic shock, Lactic acidosis, Elevated troponin from demand ischemia  Assessment & Plan:   Acute hypoxic respiratory failure from pneumonia. - initially felt to be aspiration pneumonia with improvement from therapy, but on 12/25 developed worsening hypoxia with increased WOB, fever, and progressive infiltrates - transfer back to ICU - try on Bipap and monitor need for intubation - goal SpO2 > 92% - Abx changed to vancomycin and zosyn - f/u RVP, COVID/Flu, blood cultures - f/u CXR  Hx of asthma. - prn albuterol  Acute systolic CHF. - Echo from 03/16/22 showed EF 30 to 35% - will need further cardiac assessment at some point  Intentional overdose with mood disorder. Alcohol, opiate abuse. - seen by psychiatry - one to one sitter, do not discharge not even AMA - continue phenobarb, vraylar -  prn fentanyl, ativan  Best Practice (right click and "Reselect all SmartList Selections" daily)   Diet/type: Regular consistency (see orders) DVT prophylaxis: LMWH GI prophylaxis: PPI Lines: N/A Foley:  N/A Code Status:  full code Last date of multidisciplinary goals of care discussion [03/16/2022] - See separate note by Zenia Resides on 03/16/2022  Labs       Latest Ref Rng & Units 03/19/2022    3:44 AM 03/18/2022    3:46 AM  03/17/2022    5:56 AM  CMP  Glucose 70 - 99 mg/dL 335  456  256   BUN 6 - 20 mg/dL 6  8  15    Creatinine 0.61 - 1.24 mg/dL  3.89  3.73   Sodium 135 - 145 mmol/L 135  134  136   Potassium 3.5 - 5.1 mmol/L 3.4  3.6  4.0   Chloride 98 - 111 mmol/L 105  103  101   CO2 22 - 32 mmol/L 22  23  23    Calcium 8.9 - 10.3 mg/dL 8.0  8.2  8.2        Latest Ref Rng & Units 03/19/2022    3:44 AM 03/18/2022    3:46 AM 03/17/2022    5:56 AM  CBC  WBC 4.0 - 10.5 K/uL 25.8  26.2  27.1   Hemoglobin 13.0 - 17.0 g/dL 03/20/2022  03/19/2022  76.8   Hematocrit 39.0 - 52.0 % 39.7  39.3  43.1   Platelets 150 - 400 K/uL 199  195  220     ABG    Component Value Date/Time   HCO3 28.7 (H) 03/16/2022 1218   TCO2 30 03/16/2022 1218   O2SAT 93 03/16/2022 1218    CBG (last 3)  Recent Labs    03/17/22 2040  GLUCAP 129*    Critical care time: 38 minutes  03/18/2022, MD [facility] Pulmonary/Critical Care Pager - [phone] or [phone] 03/19/2022, 9:47 PM

## 2022-03-19 NOTE — Consult Note (Addendum)
[facility] Face-to-Face Psychiatry Consult   Reason for Consult:  intentional overdose Referring Physician:  No ref. provider found  NP Hoffman Patient Identification: Jared [name] MRN:  [id number] Principal Diagnosis: Intentional heroin overdose [facility]) Diagnosis:  Principal Problem:   Intentional heroin overdose (HCC) Active Problems:   Polysubstance dependence including opioid type drug, continuous use (HCC)   Aspiration pneumonia (HCC)   Acute hypoxic respiratory failure (HCC)   Septic shock (HCC)   Alteration in electrolyte and fluid balance   ARDS (adult respiratory distress syndrome) (HCC)   Opiate overdose (HCC)   AKI (acute kidney injury) (HCC)   Hypomagnesemia   Hypophosphatemia   Acute respiratory failure with hypoxia (HCC)   Total Time spent with patient: 30 minutes  Subjective:   Jared [name] is a 41 y.o. year old male with a history of polysubstance abuse and substance-induced mood disorder, who is admitted with intentional overdose on heroin, xanax, alcohol.   HPI:   -Discussed with the nurse.  No known behavioral issues overnight/this morning.  - he is on phenobarb, CIWA, COWS protocol He is lying in the bed.  (He was just given lorazepam for reported anxiety when this writer entered the room) He states that his mood has been "down. " He states that he has not been able to talk with his mother since being in the hospital.  He states that "she needs me" and reports good relationship with his mother.  He states that he has been released from prison after 7 years of incarceration in relation to polysubstance use.  He relapsed in "shot of" fentanyl, heroin use. He denies this as suicide attempt, stating that it was "little bit too much" "doubling the dose."  When he was asked if he was aware of this lethality, he becomes vague about this despite that he also stated that the mother of his son died from overdose in July 17, 2019. He denies current SI/HI, hallucinations.  He states  that he was not aware of inpatient psychiatry transfer. However, he is amenable to this plan at this time after it has been discussed. As the interview progresses, he becomes somnolent, RASS-1.  The interview was ended due to this.   Orientation- oriented x4  Months backwards- "25, 24,23,22". Unable to tell the month despite redirection   Past Psychiatric History:  Dx: opioid use d/o, benzodiazepine use d/o, alcohol use d/o, substance-induced mood disorder. Reports h/o past suicidal attempts via OD on drugs. H/o past psych admissions; last 16-Jul-2016. Was at [facility] for Tx of addiction. No recent psych medications. Past psych medications: Vraylar, Gabapentin, Trazodone.   Risk to Self:  yes Risk to Others:  no Prior Inpatient Therapy:   Prior Outpatient Therapy:    Past Medical History:  Past Medical History:  Diagnosis Date   Asthma    Gastric ulcer    Hepatitis C    Hypertension    No past surgical history on file. Family History: No family history on file. Family Psychiatric  History: unable to obtain due to patient mental status. Social History:  Social History   Substance and Sexual Activity  Alcohol Use No     Social History   Substance and Sexual Activity  Drug Use Yes   Types: IV   Comment: herion    Social History   Socioeconomic History   Marital status: Single    Spouse name: Not on file   Number of children: Not on file   Years of education: Not on file  Highest education level: Not on file  Occupational History   Not on file  Tobacco Use   Smoking status: Former    Types: Cigarettes   Smokeless tobacco: Never  Substance and Sexual Activity   Alcohol use: No   Drug use: Yes    Types: IV    Comment: herion   Sexual activity: Not on file  Other Topics Concern   Not on file  Social History Narrative   Not on file   Social Determinants of Health   Financial Resource Strain: Not on file  Food Insecurity: Not on file  Transportation Needs: Not  on file  Physical Activity: Not on file  Stress: Not on file  Social Connections: Not on file   Additional Social History:    Allergies:   Allergies  Allergen Reactions   Tylenol [Acetaminophen] Rash    Labs:  Results for orders placed or performed during the hospital encounter of 03/16/22 (from the past 48 hour(s))  Glucose, capillary     Status: Abnormal   Collection Time: 03/17/22  8:40 PM  Result Value Ref Range   Glucose-Capillary 129 (H) 70 - 99 mg/dL    Comment: Glucose reference range applies only to samples taken after fasting for at least 8 hours.  CBC     Status: Abnormal   Collection Time: 03/18/22  3:46 AM  Result Value Ref Range   WBC 26.2 (H) 4.0 - 10.5 K/uL   RBC 4.18 (L) 4.22 - 5.81 MIL/uL   Hemoglobin 13.1 13.0 - 17.0 g/dL   HCT 62.8 36.6 - 29.4 %   MCV 94.0 80.0 - 100.0 fL   MCH 31.3 26.0 - 34.0 pg   MCHC 33.3 30.0 - 36.0 g/dL   RDW 76.5 46.5 - 03.5 %   Platelets 195 150 - 400 K/uL   nRBC 0.0 0.0 - 0.2 %    Comment: Performed at [facility], [address], [place], [place] [address]  Basic metabolic panel     Status: Abnormal   Collection Time: 03/18/22  3:46 AM  Result Value Ref Range   Sodium 134 (L) 135 - 145 mmol/L   Potassium 3.6 3.5 - 5.1 mmol/L   Chloride 103 98 - 111 mmol/L   CO2 23 22 - 32 mmol/L   Glucose, Bld 102 (H) 70 - 99 mg/dL    Comment: Glucose reference range applies only to samples taken after fasting for at least 8 hours.   BUN 8 6 - 20 mg/dL   Creatinine, Ser 1.27 0.61 - 1.24 mg/dL   Calcium 8.2 (L) 8.9 - 10.3 mg/dL   GFR, Estimated >51 >70 mL/min    Comment: (NOTE) Calculated using the CKD-EPI Creatinine Equation (2021)    Anion gap 8 5 - 15    Comment: Performed at [facility], [address] [place], [place] [address]  Magnesium     Status: Abnormal   Collection Time: 03/18/22  3:46 AM  Result Value Ref Range   Magnesium 1.6 (L) 1.7 - 2.4 mg/dL    Comment: Performed at [facility], [address], [place], [place] [address]  Phosphorus     Status: Abnormal   Collection Time: 03/18/22  3:46 AM  Result Value Ref Range   Phosphorus 1.3 (L) 2.5 - 4.6 mg/dL    Comment: Performed at [facility], [address], [place], [place] [address]  Basic metabolic panel     Status: Abnormal  Collection Time: 03/19/22  3:44 AM  Result Value Ref Range   Sodium 135 135 - 145 mmol/L   Potassium 3.4 (L) 3.5 - 5.1 mmol/L   Chloride 105 98 - 111 mmol/L   CO2 22 22 - 32 mmol/L   Glucose, Bld 133 (H) 70 - 99 mg/dL    Comment: Glucose reference range applies only to samples taken after fasting for at least 8 hours.   BUN 6 6 - 20 mg/dL   Creatinine, Ser 1.61 0.61 - 1.24 mg/dL   Calcium 8.0 (L) 8.9 - 10.3 mg/dL   GFR, Estimated >09 >60 mL/min    Comment: (NOTE) Calculated using the CKD-EPI Creatinine Equation (2021)    Anion gap 8 5 - 15    Comment: Performed at [facility], [address] [place], [place] [address]  CBC     Status: Abnormal   Collection Time: 03/19/22  3:44 AM  Result Value Ref Range   WBC 25.8 (H) 4.0 - 10.5 K/uL   RBC 4.37 4.22 - 5.81 MIL/uL   Hemoglobin 14.0 13.0 - 17.0 g/dL   HCT 81.1 91.4 - 78.2 %   MCV 90.8 80.0 - 100.0 fL   MCH 32.0 26.0 - 34.0 pg   MCHC 35.3 30.0 - 36.0 g/dL   RDW 95.6 21.3 - 08.6 %   Platelets 199 150 - 400 K/uL   nRBC 0.0 0.0 - 0.2 %    Comment: Performed at [facility], [address] [place], [place] [address]    Current Facility-Administered Medications  Medication Dose Route Frequency Provider Last Rate Last Admin   0.9 %  sodium chloride infusion  250 mL Intravenous Continuous Simonne Martinet, NP       albuterol (PROVENTIL) (2.5 MG/3ML) 0.083% nebulizer solution 2.5 mg  2.5 mg Nebulization Q4H PRN Simonne Martinet, NP       amoxicillin-clavulanate (AUGMENTIN) 875-125 MG per tablet 1 tablet  1 tablet Oral Q12H Leeroy Bock, MD   1 tablet at 03/19/22 1240   cariprazine (VRAYLAR) capsule 1.5 mg  1.5 mg Oral  Daily Olalere, Adewale A, MD   1.5 mg at 03/19/22 0935   dextromethorphan-guaiFENesin (MUCINEX DM) 30-600 MG per 12 hr tablet 1 tablet  1 tablet Oral BID PRN Leeroy Bock, MD   1 tablet at 03/19/22 1250   enoxaparin (LOVENOX) injection 40 mg  40 mg Subcutaneous Q24H Olalere, Adewale A, MD   40 mg at 03/19/22 1240   folic acid 1 mg in sodium chloride 0.9 % 50 mL IVPB  1 mg Intravenous Daily Olalere, Adewale A, MD 100.4 mL/hr at 03/19/22 0936 1 mg at 03/19/22 0936   LORazepam (ATIVAN) injection 1 mg  1 mg Intravenous Q4H PRN Francena Hanly, RPH   1 mg at 03/19/22 1422   menthol-cetylpyridinium (CEPACOL) lozenge 3 mg  1 lozenge Oral PRN Leeroy Bock, MD   3 mg at 03/19/22 1250   mupirocin ointment (BACTROBAN) 2 % 1 Application  1 Application Nasal BID Virl Diamond A, MD   1 Application at 03/19/22 0933   ondansetron (ZOFRAN) injection 4 mg  4 mg Intravenous Q6H PRN Simonne Martinet, NP   4 mg at 03/18/22 2330   PHENObarbital (LUMINAL) injection 65 mg  65 mg Intravenous Q8H Francena Hanly, RPH   65 mg at 03/19/22 1658   Followed by   Melene Muller ON 03/20/2022] PHENObarbital (LUMINAL) injection 32.5 mg  32.5 mg Intravenous Q8H Wilson,  Gertie Gowda, RPH       polyethylene glycol (MIRALAX / GLYCOLAX) packet 17 g  17 g Oral Daily PRN Simonne Martinet, NP       potassium chloride SA (KLOR-CON M) CR tablet 40 mEq  40 mEq Oral BID Leeroy Bock, MD   40 mEq at 03/19/22 0932   thiamine (VITAMIN B1) injection 100 mg  100 mg Intravenous Daily Olalere, Adewale A, MD   100 mg at 03/19/22 0932    Musculoskeletal: Strength & Muscle Tone: decreased Gait & Station:  N/A (lying in the bed Patient leans: N/A            Psychiatric Specialty Exam:  Presentation  General Appearance: Fairly Groomed  Eye Contact:Minimal  Speech:Garbled  Speech Volume:Decreased  Handedness:No data recorded  Mood and Affect  Mood:Depressed  Affect:Depressed   Thought Process  Thought  Processes:Coherent  Descriptions of Associations:No data recorded Orientation:Full (Time, Place and Person)  Thought Content:No data recorded History of Schizophrenia/Schizoaffective disorder:No data recorded Duration of Psychotic Symptoms:No data recorded Hallucinations:Hallucinations: None  Ideas of Reference:None  Suicidal Thoughts:Suicidal Thoughts: No  Homicidal Thoughts:Homicidal Thoughts: No   Sensorium  Memory:Immediate Fair  Judgment:Poor  Insight:Shallow   Executive Functions  Concentration:Poor  Attention Span:Poor  Recall:Fair  Fund of Knowledge:Fair  Language:Fair   Psychomotor Activity  Psychomotor Activity:Psychomotor Activity: Decreased Normal tone, no rigidity, no resting/postural tremors, no tardive dyskinesia    Assets  Assets:No data recorded  Sleep  Sleep:Sleep: Poor   Physical Exam: Physical Exam Review of Systems  Unable to perform ROS: Mental status change   Blood pressure (!) 142/80, pulse 80, temperature 98.6 F (37 C), temperature source Oral, resp. rate 20, height 5\' 9"  (1.753 m), weight 82.5 kg, SpO2 91 %. Body mass index is 26.86 kg/m.   Assessment Jared [name] is a 41 y.o. year old male with a history of polysubstance abuse and substance-induced mood disorder, who is admitted with intentional overdose on heroin, xanax, alcohol. He is treated for sepsis likely secondary to aspiration/multifactorial pneumonia, Acute hypoxic respiratory failure.  Psychiatry was consulted due to intentional overdose of opioid with history of same and known substance-induced mood disorder.   # Delirium Exam is notable for somnolence (in the context of phenobarb taper/being given lorazepam at the time of the evaluation), although he appeared to be engaged in the interview with the other provider according to the chart review. No apparent behavioral issues. Will continue to assess as needed. Would recommend delirium precaution as below.  #  substance induced mood disorder # Intentional overdose # polysubstance use (heroin, fentanyl) Although he denies overdosing as suicide attempt/denies current SI during the interview today, this evaluation is limited due to somnolence. According to the chart review, he did report depressive symptoms preceding to this overdose, and SI during the evaluation with the other provider.  Psychosocial stressors including recent release from prison, lose of custody of his child,  and loss of wife. Based on the suicide risk assessment as below, would recommend inpatient psychiatry when he is medically cleared. He agrees with the plans.   Recommendation - 1:1 sitter 24/7, do not discharge even [facility] - Hospital clothing only, no personal belongings in room - When "medically clear", primary team should contact TOC to facilitate transfer to inpatient psychiatric facility. - COWS-protocol for acute opioid withdrawal, CIWA/phenobarb-protocol for acute alcohol and benzo withdrawal, thiamine IV. - Continue Vraylar 1.5 mg daily (home meds, EKG 385 msec on 12/22) - Continue to monitor and  treat underlying medical causes of delirium, including infection, electrolyte disturbances, etc. - Delirium precautions - Minimize/avoid deliriogenic meds including: anticholinergic, opiates, benzodiazepines           - Maintain hydration, oxygenation, nutrition           - Limit use of restraints and catheters           - Normalize sleep patterns by minimizing nighttime noise, light and interruptions by                clustering care, opening blinds during the day           - Reorient the patient frequently, provide easily visible clock and calendar           - Provide sensory aids like glasses, hearing aids           - Encourage ambulation, regular activities and visitors to maintain cognitive stimulation    Treatment Plan Summary: Daily contact with patient to assess and evaluate symptoms and progress in  treatment  Disposition: Recommend psychiatric Inpatient admission when medically cleared.  Suicide assessment  The patient demonstrates the following risk factors for suicide: Chronic risk factors for suicide include: psychiatric disorder of depression, substance use disorder, and previous suicide attempts of overdosing heroin . Acute risk factors for suicide include: social withdrawal/isolation and loss (financial, interpersonal, professional). Protective factors for this patient include:  N/A . Considering these factors, the overall suicide risk at this point appears to be high. Patient will require inpatient psychiatry treatment.   Thank you for your consult. We will continue to follow this patient.   Neysa Hotter, MD 03/19/2022 5:22 PM

## 2022-03-19 NOTE — Progress Notes (Signed)
Pharmacy Antibiotic Note  Jared [name] is a 41 y.o. male admitted on 03/16/2022 with sepsis.  Pharmacy has been consulted for Vancomycin and Zosyn dosing. Patient with initial concern for aspiration, transitioned to Augmentin 12/25 AM but now requiring 6 L [place], Tmax 103.3, WBC elevated at 25.8. Scr 0.82 close to baseline of ~0.7. Patient was loaded with vancomycin on 12/22 and on vancomycin until 12/24 AM, will not need additional load  Plan: Vancomycin 1500 mg IV q12h (eAUC 482, Vd 0.72, Scr 0.82)  Zosyn 3.375 g IV q8h EI  Monitor cultures, renal function, s/sx improvement   Height: 5\' 9"  (175.3 cm) Weight: 82.5 kg (181 lb 14.1 oz) IBW/kg (Calculated) : 70.7  Temp (24hrs), Avg:99.7 F (37.6 C), Min:98.5 F (36.9 C), Max:103.3 F (39.6 C)  Recent Labs  Lab 03/16/22 1155 03/16/22 1228 03/16/22 1636 03/17/22 0556 03/18/22 0346 03/19/22 0344  WBC 11.2*  --   --  27.1* 26.2* 25.8*  CREATININE 1.14  --   --  0.71 0.72 0.82  LATICACIDVEN  --  2.4* 4.9*  --   --   --     Estimated Creatinine Clearance: 120.9 mL/min (by C-G formula based on SCr of 0.82 mg/dL).    Allergies  Allergen Reactions   Tylenol [Acetaminophen] Rash    Antimicrobials this admission: Unasyn 12/22 >> 12/24 Vancomycin 12/22 >> 12/24; 12/25 >>  Augmentin 12/25 x 1  Zosyn 12/25 >>   Microbiology results: 12/22 BCx: ngtd 12/25 Bcx: sent 12/25 RVP: sent 12/22 MRSA PCR: detected  Thank you for allowing pharmacy to be a part of this patient's care.  1/23 03/19/2022 8:48 PM

## 2022-03-19 NOTE — Progress Notes (Addendum)
Overnight event  In brief, 41 year old male with history of hypertension, asthma, hep C, polysubstance dependence including opioid drugs, substance induced mood disorder, history of prior overdose admitted on 12/22 for opiate overdose, septic shock secondary to aspiration pneumonia, possible alcohol withdrawal (last drink 12/21).  He was initially treated with IV antibiotics initially and then de-escalated to Augmentin today.  Troponin elevated to the 300s and felt to be from demand ischemia but also at risk for endocarditis given IV drug abuse.  Echo done this admission showing EF 30 to 35%, no prior comparison.  Notified by RN that patient is in respiratory distress.  He was on 4 L O2 during the day and tonight found by RN not wearing his oxygen and his sats were in the 80s.  He was placed on 8 L with improvement of sats to 94% but remained extremely tachypneic.  Patient immediately seen and examined at bedside.  Very ill-appearing, diaphoretic.  Tachypneic to the 50s with shallow respirations.  Heart rate in the 90s, SpO2 94% on 8 L, blood pressure 134/88.  Temperature 103.3 F.  Patient awake and alert.  Complaining of shortness of breath and pain in his chest and back.  No wheezing appreciated upon auscultation of the lungs.  I placed STAT orders for EKG, troponin, chest x-ray, ABG, CBC, lactate, and blood cultures.  Ordered testing for COVID/flu/RSV as this has not been done yet.  Placed order for BiPAP.  Ordered ibuprofen for fever as he is allergic to Tylenol.  Ordered vancomycin and Zosyn.  Critical care team called and immediately came to bedside, appreciate assistance.

## 2022-03-19 NOTE — Progress Notes (Signed)
eLink Physician-Brief Progress Note Patient Name: Jared [name] DOB: 1980-07-21 MRN: [id number]   Date of Service  03/19/2022  HPI/Events of Note  Now in ICU on 03/01/39% BIPAP. Mild tachypnea. Other vitals ok on camera. CCM note reviewed  eICU Interventions  Continue bipap Due for 40 meq oral K, has it ordered twice daily but last checked in AM Check bmp and mag now- let me know results      Intervention Category Major Interventions: Respiratory failure - evaluation and management Evaluation Type: New Patient Evaluation  [name] 03/19/2022, 11:13 PM  Addendum at 12:16 am - Labs back. Repleted per E link protocol  Addendum at 5 am: Asked to see patient since he has taken his BIPAP off. Very adamant about not using it. Talked to him on camera. He is fully with it, but wants to eat and drink something. ON room air, O2 sat is 94 . Mild tachypnea. Discussed with him that we cannot feed him at this time . Gave him some ice chips. He felt better with those. His WOB has improved since admission. We will try HFNC to see if he tolerates it and give him a break from BIPAP. If he does not tolerate it, will place him back on BIPAP. Discussed this with him. He reluctantly agrees. Explained that we are trying to avoid intubation. I also ordered Precedex to allow for better bipap tolerance in case we need it. He is very keen on eating something this AM if he improves. D/w RN  Addendum at 6:25 am - seen on camera for follow up. Asleep. Calm. ON low dose precdex. O2 sat 93. Asleep on his left side. On HFNC. Continue as planned, bipap this AM.

## 2022-03-19 NOTE — Progress Notes (Signed)
Responding to Rapid for patient for respiratory distress, obtained ABG and placed pt on Bipap

## 2022-03-19 NOTE — Progress Notes (Addendum)
PROGRESS NOTE  Jared [name]    DOB: July 24, 1980, 41 y.o.  SAY:[id number]    Code Status: Full Code   DOA: 03/16/2022   LOS: 3   Brief hospital course  Jared [name] is a 41 y.o. male with a PMH significant for HTN, Asthma, Hep C, polysubstance dependence including opioid type drugs, substance induced mood disorder, and hx of prior overdose.  He presented to the ED by EMS 12/22 after pt mother found him down (unknown downtime) next to Heroin, Xanax, and Wine. EMS gave Narcan with positive response, and arrived to the ED with Spo2 in the 70's, placed on NRB. Pt was alert to stimuli and had vomit on his shirt. Pt respiratory status continued to decompensate leading to needing Heated HFNC to maintain Spo2 >90%. Pt also became hypotensive (71/55), requiring NE in addition to 3L fluid boluses.    ED workup reveled WBC 11.2, Trop 307, LA 2.4, Blood Cultures Pending, CXR showed diffuse alveolar densities seen in both lungs, more so in right lower lung field suggesting multifocal PNA. He presented with a rectal temp 101.74F.  Psychiatry recommending inpatient psychiatric stay once medically stable  03/19/22 - patient ORA and transitioned to PO Abx.   Assessment & Plan  Principal Problem:   Intentional heroin overdose (HCC) Active Problems:   Aspiration pneumonia (HCC)   Acute hypoxic respiratory failure (HCC)   ARDS (adult respiratory distress syndrome) (HCC)   Alteration in electrolyte and fluid balance   Polysubstance dependence including opioid type drug, continuous use (HCC)   Septic shock (HCC)   Opiate overdose (HCC)   AKI (acute kidney injury) (HCC)   Hypomagnesemia   Hypophosphatemia   Acute respiratory failure with hypoxia (HCC)  Septic Shock w/ Lactic Acidosis> likely secondary to aspiration PNA d/t overdose with unknown downtime. BxCx NGTD. Clinically improved - follow WBC, fever - Unasyn, Vanco initially   - dc vancomycin 12/24  - unasyn > augmentin 12/25   Acute  Respiratory Failure- in the setting of asp. PNA post overdose, requiring HHFNC now weaned to 2L [place]. Patient does not have oxygen tubing in nose on exam and denies SOB.  - ambulate with pulse ox ORA - continue unasyn> augmentin 12/25-12/29   Hx of Asthma- not in acute exacerbation. No wheezing - PRN SABA   Elevated Troponin- likely demand ischemia but also risk for endocarditis given IV drug abuse hx. Denies chest pain currently. Echo showing LVEF 30-35%, no comparison.  - awaiting cardiology consult   Possible Withdrawal- drinks a box of wine per week, last drink 12/21 - Phenobarb taper continue - CIWA, and COWA monitoring   Mood Disorder with Intentional Overdose: (patient now states no intent for self harm). He does have history of intentional overdose in 2018 for which he was admitted to [facility].  - psych consulted, appreciate recs  - plan to admit to inpatient psychiatric care after medically cleared today or tomorrow  - 1:1 sitter - Resume home cariprazine (BID at home, can only order daily here) - Holding home gabapentin, hydroxyzine  Hypophosphatemia  hypomagnesemia  hyponatremia  hypokalemia- mild - monitor and replete PRN  Body mass index is 26.86 kg/m.  VTE ppx: enoxaparin (LOVENOX) injection 40 mg Start: 03/17/22 1145 SCDs Start: 03/16/22 1528  Diet:     Diet   Diet regular Room service appropriate? Yes; Fluid consistency: Thin   Consultants: Psychiatry Cardiology   Subjective 03/19/22    Pt reports complaint of cough and congestion. Denies chest pain,  SOB. Questions why he needs inpatient psych admission.   Objective   Vitals:   03/18/22 1600 03/18/22 1927 03/18/22 2337 03/19/22 0400  BP:  131/82 (!) 141/81 117/82  Pulse:   91 78  Resp:      Temp: 98.5 F (36.9 C) 98 F (36.7 C) 98.9 F (37.2 C) 98.5 F (36.9 C)  TempSrc: Oral Oral Oral   SpO2:  92% 94% 91%  Weight:      Height:        Intake/Output Summary (Last 24 hours) at  03/19/2022 0848 Last data filed at 03/19/2022 0604 Gross per 24 hour  Intake --  Output 4375 ml  Net -4375 ml    Filed Weights   03/16/22 1219 03/17/22 0500 03/18/22 0426  Weight: 84 kg 82 kg 82.5 kg     Physical Exam:  General: awake, alert, NAD HEENT: atraumatic, clear conjunctiva, anicteric sclera, MMM, hearing grossly normal Respiratory: normal respiratory effort. CTAB Cardiovascular: quick capillary refill, normal S1/S2, RRR, no JVD, murmurs Gastrointestinal: soft, NT, ND Nervous: A&O x3. no gross focal neurologic deficits, normal speech Extremities: moves all equally, no edema, normal tone Skin: dry, intact, normal temperature, normal color. No rashes, lesions or ulcers on exposed skin Psychiatry: agitated mood, uncooperative with exam  Labs   I have personally reviewed the following labs and imaging studies CBC    Component Value Date/Time   WBC 25.8 (H) 03/19/2022 0344   RBC 4.37 03/19/2022 0344   HGB 14.0 03/19/2022 0344   HCT 39.7 03/19/2022 0344   PLT 199 03/19/2022 0344   MCV 90.8 03/19/2022 0344   MCH 32.0 03/19/2022 0344   MCHC 35.3 03/19/2022 0344   RDW 12.3 03/19/2022 0344   LYMPHSABS 0.7 03/16/2022 1155   MONOABS 0.6 03/16/2022 1155   EOSABS 0.0 03/16/2022 1155   BASOSABS 0.0 03/16/2022 1155      Latest Ref Rng & Units 03/19/2022    3:44 AM 03/18/2022    3:46 AM 03/17/2022    5:56 AM  BMP  Glucose 70 - 99 mg/dL 888  280  034   BUN 6 - 20 mg/dL 6  8  15    Creatinine 0.61 - 1.24 mg/dL  9.17  9.15   Sodium 135 - 145 mmol/L 135  134  136   Potassium 3.5 - 5.1 mmol/L 3.4  3.6  4.0   Chloride 98 - 111 mmol/L 105  103  101   CO2 22 - 32 mmol/L 22  23  23    Calcium 8.9 - 10.3 mg/dL 8.0  8.2  8.2    Disposition Plan & Communication  Patient status: Inpatient  Admitted From: Home Planned disposition location: psychiatric admission Anticipated discharge date: 12/25 or 26 pending clinical improvement, psychiatric admission  Family  Communication: none    Author: , DO [facility] 03/19/2022, 8:48 AM   Available by Epic secure chat 7AM-7PM. If 7PM-7AM, please contact night-coverage.  [facility] contact information found on Leer[facility]ock.

## 2022-03-19 NOTE — Progress Notes (Signed)
Transported patient to 78M from 5W via BiPap, without incident!

## 2022-03-20 LAB — MAGNESIUM: Magnesium: 1.8 mg/dL (ref 1.7–2.4)

## 2022-03-20 LAB — BASIC METABOLIC PANEL
Anion gap: 9 (ref 5–15)
BUN: 9 mg/dL (ref 6–20)
CO2: 21 mmol/L — ABNORMAL LOW (ref 22–32)
Calcium: 7.8 mg/dL — ABNORMAL LOW (ref 8.9–10.3)
Chloride: 105 mmol/L (ref 98–111)
Creatinine, Ser: 0.68 mg/dL (ref 0.61–1.24)
GFR, Estimated: >60 mL/min (ref >60)
Glucose, Bld: 119 mg/dL — ABNORMAL HIGH (ref 70–99)
Potassium: 3.3 mmol/L — ABNORMAL LOW (ref 3.5–5.1)
Sodium: 135 mmol/L (ref 135–145)

## 2022-03-20 LAB — CULTURE, BLOOD (ROUTINE X 2): Culture: NO GROWTH

## 2022-03-20 MED ORDER — FOLIC ACID 5 MG/ML IJ SOLN
1.0000 mg | Freq: Every day | INTRAMUSCULAR | Status: DC
  Administered 2022-03-20 – 2022-03-24 (×5): 1 mg via INTRAVENOUS
  Filled 2022-03-20 (×7): qty 0.2

## 2022-03-20 MED ORDER — MAGNESIUM SULFATE 2 GM/50ML IV SOLN
2.0000 g | Freq: Once | INTRAVENOUS | Status: AC
  Administered 2022-03-20: 2 g via INTRAVENOUS
  Filled 2022-03-20: qty 50

## 2022-03-20 MED ORDER — POTASSIUM CHLORIDE 10 MEQ/100ML IV SOLN
10.0000 meq | INTRAVENOUS | Status: AC
  Administered 2022-03-20 (×6): 10 meq via INTRAVENOUS
  Filled 2022-03-20 (×6): qty 100

## 2022-03-20 MED ORDER — DEXMEDETOMIDINE HCL IN NACL 400 MCG/100ML IV SOLN
0.4000 ug/kg/h | INTRAVENOUS | Status: DC
  Administered 2022-03-20: 0.5 ug/kg/h via INTRAVENOUS
  Administered 2022-03-20: 0.7 ug/kg/h via INTRAVENOUS
  Administered 2022-03-20: 0.4 ug/kg/h via INTRAVENOUS
  Administered 2022-03-21: 0.7 ug/kg/h via INTRAVENOUS
  Administered 2022-03-21: 0.9 ug/kg/h via INTRAVENOUS
  Filled 2022-03-20 (×5): qty 100

## 2022-03-20 NOTE — TOC Progression Note (Signed)
Transition of Care [facility]) - Initial/Assessment Note    Patient Details  Name: Jared [name] MRN: [id number] Date of Birth: 01/11/1981  Transition of [facility]) CM/SW Contact:    Ralene Bathe, LCSWA Phone Number: 03/20/2022, 3:05 PM  Clinical Narrative:                 LCSW completed IVC paperwork at the request of MD and contacted law enforcement to serve.  TOC following.    Patient Goals and CMS Choice            Expected Discharge Plan and Services                                              Prior Living Arrangements/Services                       Activities of Daily Living      Permission Sought/Granted                  Emotional Assessment              Admission diagnosis:  Acute respiratory failure with hypoxia (HCC) [J96.01] Septic shock (HCC) [A41.9, R65.21] Opiate overdose, undetermined intent, initial encounter (HCC) [T40.604A] Patient Active Problem List   Diagnosis Date Noted   Opiate overdose (HCC) 03/18/2022   AKI (acute kidney injury) (HCC) 03/18/2022   Hypomagnesemia 03/18/2022   Hypophosphatemia 03/18/2022   Acute respiratory failure with hypoxia (HCC) 03/18/2022   Aspiration pneumonia (HCC) 03/16/2022   Acute hypoxic respiratory failure (HCC) 03/16/2022   Septic shock (HCC) 03/16/2022   Intentional heroin overdose (HCC) 03/16/2022   Alteration in electrolyte and fluid balance 03/16/2022   ARDS (adult respiratory distress syndrome) (HCC) 03/16/2022   Polysubstance dependence including opioid type drug, continuous use (HCC) 01/08/2017   Substance induced mood disorder (HCC) 01/08/2017   PCP:  Patient, No Pcp Per Pharmacy:   [id number] [place], [place] - [address] [place] [address] Phone: [phone] Fax: [phone]  [id number] [place],  - [address] AT [facility] & [address] [place]  [address] Phone: [phone] Fax: [phone]     Social Determinants of Health (SDOH) Social History: SDOH Screenings   Alcohol Screen: Medium Risk (01/30/2017)  Tobacco Use: Medium Risk (04/05/2020)   SDOH Interventions:     Readmission Risk Interventions     No data to display

## 2022-03-20 NOTE — Progress Notes (Signed)
   NAME:  TIVIS WHERRY, MRN:  [id number], DOB:  September 24, 1980, LOS: 4 ADMISSION DATE:  03/16/2022, CONSULTATION DATE:  12/22 REFERRING MD:  Jearld Fenton, CHIEF COMPLAINT:  Septic Shock   History of Present Illness:  41 y/o male admitted in the context of septic shock from aspiration pneumonia after an intentional narcotic overdose.  Pertinent  Medical History  Prior overdose Opioid dependence Substance induced mood disorder Hep C  Hypertension  Significant Hospital Events: Including procedures, antibiotic start and stop dates in addition to other pertinent events   12/22 - ED > 3L Fluid received Azithromycin/Ceftriaxone, Vanco, HHFNC, NE started > Admitted to ICU Psyche consult> suicide precautions, don't let leave AMA, d/c to inpatient psyche facility; started phenobarbital  12/25 Increased WOB, Tm 103.58F, transfer back to ICU and broaden ABx coverage, try on Bipap; RSV pneumonia  Interim History / Subjective:  Remains critically ill On precedex Agitated this morning  Objective   Blood pressure 130/71, pulse 71, temperature 98 F (36.7 C), temperature source Oral, resp. rate (!) 30, height 5\' 9"  (1.753 m), weight 81.5 kg, SpO2 93 %.    FiO2 (%):  [40 %] 40 %   Intake/Output Summary (Last 24 hours) at 03/20/2022 1126 Last data filed at 03/20/2022 1000 Gross per 24 hour  Intake 1671.02 ml  Output 1190 ml  Net 481.02 ml   Filed Weights   03/17/22 0500 03/18/22 0426 03/20/22 0447  Weight: 82 kg 82.5 kg 81.5 kg    Examination:  General:  Increased work of breathing in bed, no accessory muscle use HENT: NCAT OP clear PULM: Rhonchi bilaterally B, increased effort, no accessory muscle use CV: RRR, no mgr GI: BS+, soft, nontender MSK: normal bulk and tone Neuro: asleep on precedex   Resolved Hospital Problem list     Assessment & Plan:  Acute respiratory failure with hypoxemia  Aspiration pneumonia RSV pneumonia Acute metabolic encephalopathy from EtOH withdrawal Acute  systolic heart failure History of asthma  Discussion: Remains hypoxemic, brought back to ICU for the same overnight.  At this point doesn't need intubation but high risk for that.  Plan: Narrow antibiotics to zosyn alone F/u blood cultures NPO for now Continue precedex for now Needs Involuntary commitment paperwork now, have asked social work to start process Suicide sitter Do not let leave AMA Continue thiamine, folate Wean off phenobarbital Remain in ICU, monitor closely for intubation need  Best Practice (right click and "Reselect all SmartList Selections" daily)   Diet/type: NPO DVT prophylaxis: LMWH GI prophylaxis: N/A Lines: N/A Foley:  N/A Code Status:  full code Last date of multidisciplinary goals of care discussion [pending]   Critical care time: 32 minutes    03/22/22, MD [place] [facility] Pager: [phone] Cell: [phone] After 7:00 pm call Elink  [phone]

## 2022-03-20 NOTE — Progress Notes (Addendum)
1920: Called to pt room by NT sitter d/t pt respiratory concerns. Upon entering room, pt observed to be tachypnic w/ RR >40 min, also noted pt had removed IV. Pt aroused to voice and was A&Ox4 and verbalized c/o being woken up. Pt initially denied SOB/chest pain but stated he was anxious and was requesting more Ativan. Pt was not on telemetry or pulse-ox monitoring at this time. Upon applying monitoring equipment pt 02 sats noted to be in mid-80s, pt temp also elevated: 103.3 axillary. Lung sounds clear to auscultation. Pt placed on HFNC 8L w/ quick sat recovery but tachypnea remained unchanged. Monica Becton, MD paged and Rapid Response called.  After assessing at bedside w/ CCM MD, V. Rathore made decision to transfer pt to MICU. Orders also placed for respiratory swab, CXR, EKG, blood work, and BiPap. Pt successfully transferred to ICU at approx 2230.

## 2022-03-20 NOTE — Progress Notes (Signed)
[facility] ADULT ICU REPLACEMENT PROTOCOL   The patient does apply for the [facility] Adult ICU Electrolyte Replacment Protocol based on the criteria listed below:   1.Exclusion criteria: TCTS, ECMO, Dialysis, and Myasthenia Gravis patients 2. Is GFR >/= 30 ml/min? Yes.    Patient's GFR today is >60 3. Is SCr </= 2? Yes.   Patient's SCr is 0.68 mg/dL 4. Did SCr increase >/= 0.5 in 24 hours? No. 5.Pt's weight >40kg  Yes.   6. Abnormal electrolyte(s): K+ 3.3, mag 1.8  7. Electrolytes replaced per protocol 8.  Call MD STAT for K+ </= 2.5, Phos </= 1, or Mag </= 1 Physician:  Lucile Shutters [facility] 03/20/2022 1:11 AM

## 2022-03-21 ENCOUNTER — Inpatient Hospital Stay (HOSPITAL_COMMUNITY): Payer: BC Managed Care – PPO

## 2022-03-21 LAB — CBC
HCT: 41 % (ref 39.0–52.0)
Hemoglobin: 14.3 g/dL (ref 13.0–17.0)
MCH: 31.2 pg (ref 26.0–34.0)
MCHC: 34.9 g/dL (ref 30.0–36.0)
MCV: 89.5 fL (ref 80.0–100.0)
Platelets: 225 10*3/uL (ref 150–400)
RBC: 4.58 MIL/uL (ref 4.22–5.81)
RDW: 12.6 % (ref 11.5–15.5)
WBC: 11.9 10*3/uL — ABNORMAL HIGH (ref 4.0–10.5)
nRBC: 0 % (ref 0.0–0.2)

## 2022-03-21 LAB — BLOOD GAS, ARTERIAL
Acid-Base Excess: 4.4 mmol/L — ABNORMAL HIGH (ref 0.0–2.0)
Bicarbonate: 26.7 mmol/L (ref 20.0–28.0)
Drawn by: 55062
O2 Saturation: 97.8 %
Patient temperature: 37.1
pCO2 arterial: 32 mmHg (ref 32–48)
pH, Arterial: 7.53 — ABNORMAL HIGH (ref 7.35–7.45)
pO2, Arterial: 77 mmHg — ABNORMAL LOW (ref 83–108)

## 2022-03-21 LAB — BASIC METABOLIC PANEL
Anion gap: 6 (ref 5–15)
BUN: 14 mg/dL (ref 6–20)
CO2: 24 mmol/L (ref 22–32)
Calcium: 7.8 mg/dL — ABNORMAL LOW (ref 8.9–10.3)
Chloride: 105 mmol/L (ref 98–111)
Creatinine, Ser: 0.78 mg/dL (ref 0.61–1.24)
GFR, Estimated: >60 mL/min (ref >60)
Glucose, Bld: 121 mg/dL — ABNORMAL HIGH (ref 70–99)
Potassium: 3.8 mmol/L (ref 3.5–5.1)
Sodium: 135 mmol/L (ref 135–145)

## 2022-03-21 LAB — BETA-HYDROXYBUTYRIC ACID: Beta-Hydroxybutyric Acid: 0.05 mmol/L (ref 0.05–0.27)

## 2022-03-21 LAB — LACTIC ACID, PLASMA: Lactic Acid, Venous: 2.3 mmol/L (ref 0.5–1.9)

## 2022-03-21 LAB — D-DIMER, QUANTITATIVE: D-Dimer, Quant: 4.66 ug/mL-FEU — ABNORMAL HIGH (ref 0.00–0.50)

## 2022-03-21 LAB — MAGNESIUM: Magnesium: 2.1 mg/dL (ref 1.7–2.4)

## 2022-03-21 LAB — CULTURE, BLOOD (ROUTINE X 2)

## 2022-03-21 MED ORDER — ALBUTEROL SULFATE (2.5 MG/3ML) 0.083% IN NEBU
2.5000 mg | INHALATION_SOLUTION | Freq: Four times a day (QID) | RESPIRATORY_TRACT | Status: AC
  Administered 2022-03-21 (×2): 2.5 mg via RESPIRATORY_TRACT
  Filled 2022-03-21 (×3): qty 3

## 2022-03-21 NOTE — Progress Notes (Incomplete)
eLink Physician-Brief Progress Note Patient Name: Jared [name] DOB: 1981-01-31 MRN: [id number]   Date of Service  03/21/2022  HPI/Events of Note  CXR shows bilateral lung infiltrates, D-Dimer 4.66.  eICU Interventions          Migdalia Dk 03/21/2022, 11:58 PM

## 2022-03-21 NOTE — Consult Note (Signed)
Jared [name] is a 41 y.o. male with a PMH HTN, Asthma, Hep C, polysubstance dependence including opioid type drugs, substance induced mood disorder, and hx of prior overdose, He presented to the ED by EMS today after pt mother found him down (unknown downtime) next to Heroin, Xanax, and Wine. EMS gave Narcan with positive response, and arrived to the ED with Spo2 in the 70's, placed on NRB. Pt was alert to stimuli and had vomit on his shirt. Pt respiratory status continued to decompensate leading to needing Heated HFNC to maintain Spo2 >90%. Pt also became hypotensive (71/55), requiring NE post his 3L fluid boluses.   Psychiatry consult service continues to follow from a distance.  Patient did present initially as a suicide attempt, continues to meet inpatient psychiatric criteria and once medically stable.  At the time of this reevaluation patient remains in intensive care, with high flow nasal cannula, tachypnea and increased work of breathing at times.  Will defer psychiatric reassessment, due to medical conditions.

## 2022-03-21 NOTE — Progress Notes (Signed)
eLink Physician-Brief Progress Note Patient Name: Jared [name] DOB: Aug 22, 1980 MRN: [id number]   Date of Service  03/21/2022  HPI/Events of Note  Patient with persistent tachypnea (RR 40 - 50's), etiology is unclear, saturation is normal and patient is not otherwise distressed, history of substance abuse, and admitted for altered mental status. Recent unremarkable BMP.  eICU Interventions  Will order CXR, ABG, lactic acid, Beta Hydroxybutyric acid,  D-Dimer, and urine toxicology screen to screen for occult process, also possible tachypnea is psychogenic, given psychiatric history and on going evaluation by psychiatry.        Jared [name] 03/21/2022, 9:17 PM

## 2022-03-21 NOTE — Progress Notes (Signed)
eLink Physician-Brief Progress Note Patient Name: AUNDRA ESPIN DOB: 1980-07-12 MRN: [id number]   Date of Service  03/21/2022  HPI/Events of Note  CXR shows bilateral lung infiltrates, D-Dimer 4.66.  eICU Interventions  CTA chest PE protocol ordered, will also check BNP.        Thomasene Lot Birl Lobello 03/21/2022, 11:58 PM

## 2022-03-21 NOTE — Evaluation (Signed)
Clinical/Bedside Swallow Evaluation Patient Details  Name: Jared [name] MRN: [id number] Date of Birth: 1981-01-28  Today's Date: 03/21/2022 Time: SLP Start Time (ACUTE ONLY): 0950 SLP Stop Time (ACUTE ONLY): 1005 SLP Time Calculation (min) (ACUTE ONLY): 15 min  Past Medical History:  Past Medical History:  Diagnosis Date   Asthma    Gastric ulcer    Hepatitis C    Hypertension    Past Surgical History: No past surgical history on file. HPI:  Patient is a 41 y.o. male with PMH: polysubstance dependence including opiod type drugs, substance induced mood disorder, h/o prior overdose, asthma, Hep C, HTN. He presented to the hospital on 03/16/22 after being found down by mother (unknown downtime) with Herion, Xanax and wine present near him. EMS gave patient Narcan with positive response and he arrived to ED with SpO2 in 70%'s, was placed on NRB. His respiratory status continued to decompensate leading to needing HHFNC to maintain SpO2 >90%. CXR showed diffuse alveolar densities in both lungs but greater in RLL suggesting multifocal PNA. He was febrile at 101.81F. on 12/25 he had increased WOB, Tmax of 103.81F and he was placed on BiPAP, found to have RSV PNA. He was able to be weaned from BiPAP and as of 12/27 he is on 10L HFNC. He was started on clear liquids diet on 12/26 by MD. SLP swallow evaluation ordered due to concern for coughing with liquids.    Assessment / Plan / Recommendation  Clinical Impression  Patient is not currently presenting with clinical s/s of dysphagia as per this bedside swallow evaluation. SLP assessed his toleration of thin liquids (water), medication tablet from RN and regular solids (graham crackers). No observed oral or pharyngeal phase difficulties. Swallow initiation appeared [facility] and no coughing, throat clearing or other overt s/s that could be indicative of dysphagia. Patient's SpO2 remained steady at 97% but his RR did increase to as high as 34 but generally  was in low to mid 20's. Patient has no h/o dysphagia. SLP recommending regular texture solids, thin liquids with no further skilled intervention needed at this time. SLP Visit Diagnosis: Dysphagia, unspecified (R13.10)    Aspiration Risk  No limitations    Diet Recommendation Regular;Thin liquid   Liquid Administration via: Cup;Straw Medication Administration: Whole meds with liquid Supervision: Patient able to self feed Compensations: Slow rate Postural Changes: Seated upright at 90 degrees    Other  Recommendations Oral Care Recommendations: Oral care BID;Patient independent with oral care    Recommendations for follow up therapy are one component of a multi-disciplinary discharge planning process, led by the attending physician.  Recommendations may be updated based on patient status, additional functional criteria and insurance authorization.  Follow up Recommendations No SLP follow up      Assistance Recommended at Discharge  N/A  Functional Status Assessment Patient has not had a recent decline in their functional status  Frequency and Duration     N/A       Prognosis   N/A     Swallow Study   General Date of Onset: 03/19/22 HPI: Patient is a 41 y.o. male with PMH: polysubstance dependence including opiod type drugs, substance induced mood disorder, h/o prior overdose, asthma, Hep C, HTN. He presented to the hospital on 03/16/22 after being found down by mother (unknown downtime) with Herion, Xanax and wine present near him. EMS gave patient Narcan with positive response and he arrived to ED with SpO2 in 70%'s, was placed on NRB. His respiratory  status continued to decompensate leading to needing HHFNC to maintain SpO2 >90%. CXR showed diffuse alveolar densities in both lungs but greater in RLL suggesting multifocal PNA. He was febrile at 101.66F. on 12/25 he had increased WOB, Tmax of 103.66F and he was placed on BiPAP, found to have RSV PNA. He was able to be weaned from  BiPAP and as of 12/27 he is on 10L HFNC. He was started on clear liquids diet on 12/26 by MD. SLP swallow evaluation ordered due to concern for coughing with liquids. Type of Study: Bedside Swallow Evaluation Previous Swallow Assessment: none found Diet Prior to this Study: Thin liquids Temperature Spikes Noted: No Respiratory Status: Nasal cannula History of Recent Intubation: No Behavior/Cognition: Alert;Cooperative;Pleasant mood Oral Cavity Assessment: Within Functional Limits Oral Care Completed by SLP: No Oral Cavity - Dentition: Adequate natural dentition Vision: Functional for self-feeding Self-Feeding Abilities: Able to feed self Patient Positioning: Upright in bed Baseline Vocal Quality: Normal Volitional Cough: Strong Volitional Swallow: Able to elicit    Oral/Motor/Sensory Function Overall Oral Motor/Sensory Function: Within functional limits   Ice Chips     Thin Liquid Thin Liquid: Within functional limits Presentation: Straw;Self Fed    Nectar Thick     Honey Thick     Puree Puree: Not tested   Solid     Solid: Within functional limits Presentation: Self Fed      Angela Nevin, MA, CCC-SLP Speech Therapy

## 2022-03-21 NOTE — Progress Notes (Signed)
NAME:  Jared [name], MRN:  [id number], DOB:  1981/02/17, LOS: 5 ADMISSION DATE:  03/16/2022, CONSULTATION DATE:  12/22 REFERRING MD:  Jearld Fenton, CHIEF COMPLAINT:  Septic Shock   History of Present Illness:  41 y/o male admitted in the context of septic shock from aspiration pneumonia after an intentional narcotic overdose.  Pertinent  Medical History  Prior overdose Opioid dependence Substance induced mood disorder Hep C  Hypertension  Significant Hospital Events: Including procedures, antibiotic start and stop dates in addition to other pertinent events   12/22 - ED > 3L Fluid received Azithromycin/Ceftriaxone, Vanco, HHFNC, NE started > Admitted to ICU Psyche consult> suicide precautions, don't let leave AMA, d/c to inpatient psyche facility; started phenobarbital  12/25 Increased WOB, Tm 103.73F, transfer back to ICU and broaden ABx coverage, try on Bipap; RSV pneumonia 12/27 Remains on precedex, 10L HFNC  Interim History / Subjective:  Still somewhat agitated and tachypneic on precedex IVC active   Objective   Blood pressure 107/68, pulse 66, temperature (!) 100.5 F (38.1 C), temperature source Axillary, resp. rate (!) 34, height 5\' 9"  (1.753 m), weight 80.8 kg, SpO2 94 %.    FiO2 (%):  [40 %] 40 %   Intake/Output Summary (Last 24 hours) at 03/21/2022 0759 Last data filed at 03/21/2022 0700 Gross per 24 hour  Intake 2100.55 ml  Output 1475 ml  Net 625.55 ml    Filed Weights   03/18/22 0426 03/20/22 0447 03/21/22 0500  Weight: 82.5 kg 81.5 kg 80.8 kg     General:  thin M, awake and somewhat agitated HEENT: MM pink/moist, sclera anicteric  Neuro: awake, agitated, conversational, following commands CV: s1s2 rrr, no m/r/g PULM:  tachypneic, mild expiratory wheezing/rhonchi and decreased air entry bilaterally  GI: soft, non-tender Extremities: warm/dry, no edema  Skin: no rashes or lesions  Labs pending   Resolved Hospital Problem list     Assessment &  Plan:   Acute respiratory failure with hypoxemia  Aspiration pneumonia RSV pneumonia Acute metabolic encephalopathy from EtOH withdrawal Acute systolic heart failure History of asthma    Plan: Continue zosyn for PNA Blood cultures NGTD Speech consult, advance diet as tolerated Wean precedex and phenobarbital as able Now IVC, continue Suicide sitter Continue thiamine, folate Remain in ICU, monitor closely for intubation need -schedule nebs  Best Practice (right click and "Reselect all SmartList Selections" daily)   Diet/type: NPO advance diet after speech consult DVT prophylaxis: LMWH GI prophylaxis: N/A Lines: N/A Foley:  N/A Code Status:  full code Last date of multidisciplinary goals of care discussion [pending, improving]   Critical care time: 35 minutes    CRITICAL CARE Performed by: 03/23/22 Kaleena Corrow   Total critical care time: 35 minutes  Critical care time was exclusive of separately billable procedures and treating other patients.  Critical care was necessary to treat or prevent imminent or life-threatening deterioration.  Critical care was time spent personally by me on the following activities: development of treatment plan with patient and/or surrogate as well as nursing, discussions with consultants, evaluation of patient's response to treatment, examination of patient, obtaining history from patient or surrogate, ordering and performing treatments and interventions, ordering and review of laboratory studies, ordering and review of radiographic studies, pulse oximetry and re-evaluation of patient's condition.    Darcella Gasman Tinika Bucknam, PA-C [facility] See Amion for pager If no response to pager , please call [phone] until 7pm After 7:00 pm call Elink  [phone]

## 2022-03-22 ENCOUNTER — Encounter (HOSPITAL_COMMUNITY): Payer: Self-pay | Admitting: Pulmonary Disease

## 2022-03-22 ENCOUNTER — Inpatient Hospital Stay (HOSPITAL_COMMUNITY): Payer: BC Managed Care – PPO

## 2022-03-22 DIAGNOSIS — I5043 Acute on chronic combined systolic (congestive) and diastolic (congestive) heart failure: Secondary | ICD-10-CM

## 2022-03-22 DIAGNOSIS — T40604A Poisoning by unspecified narcotics, undetermined, initial encounter: Secondary | ICD-10-CM | POA: Diagnosis not present

## 2022-03-22 LAB — URINALYSIS, ROUTINE W REFLEX MICROSCOPIC
Bilirubin Urine: NEGATIVE
Glucose, UA: NEGATIVE mg/dL
Hgb urine dipstick: NEGATIVE
Ketones, ur: NEGATIVE mg/dL
Leukocytes,Ua: NEGATIVE
Nitrite: NEGATIVE
Protein, ur: NEGATIVE mg/dL
Specific Gravity, Urine: 1.02 (ref 1.005–1.030)
pH: 6 (ref 5.0–8.0)

## 2022-03-22 LAB — RAPID URINE DRUG SCREEN, HOSP PERFORMED
Amphetamines: NOT DETECTED
Barbiturates: POSITIVE — AB
Benzodiazepines: POSITIVE — AB
Cocaine: NOT DETECTED
Opiates: POSITIVE — AB
Tetrahydrocannabinol: POSITIVE — AB

## 2022-03-22 LAB — BASIC METABOLIC PANEL
Anion gap: 9 (ref 5–15)
BUN: 16 mg/dL (ref 6–20)
CO2: 24 mmol/L (ref 22–32)
Calcium: 7.4 mg/dL — ABNORMAL LOW (ref 8.9–10.3)
Chloride: 101 mmol/L (ref 98–111)
Creatinine, Ser: 1.14 mg/dL (ref 0.61–1.24)
GFR, Estimated: >60 mL/min (ref >60)
Glucose, Bld: 108 mg/dL — ABNORMAL HIGH (ref 70–99)
Potassium: 3.4 mmol/L — ABNORMAL LOW (ref 3.5–5.1)
Sodium: 134 mmol/L — ABNORMAL LOW (ref 135–145)

## 2022-03-22 LAB — MAGNESIUM: Magnesium: 2.2 mg/dL (ref 1.7–2.4)

## 2022-03-22 LAB — BRAIN NATRIURETIC PEPTIDE: B Natriuretic Peptide: 104.6 pg/mL — ABNORMAL HIGH (ref 0.0–100.0)

## 2022-03-22 MED ORDER — GABAPENTIN 300 MG PO CAPS
300.0000 mg | ORAL_CAPSULE | Freq: Two times a day (BID) | ORAL | Status: DC
  Administered 2022-03-22 – 2022-03-24 (×5): 300 mg via ORAL
  Filled 2022-03-22 (×6): qty 1

## 2022-03-22 MED ORDER — POTASSIUM CHLORIDE CRYS ER 20 MEQ PO TBCR
40.0000 meq | EXTENDED_RELEASE_TABLET | Freq: Once | ORAL | Status: AC
  Administered 2022-03-22: 40 meq via ORAL
  Filled 2022-03-22: qty 2

## 2022-03-22 MED ORDER — CARIPRAZINE HCL 1.5 MG PO CAPS
3.0000 mg | ORAL_CAPSULE | Freq: Every day | ORAL | Status: DC
  Administered 2022-03-23 – 2022-03-24 (×2): 3 mg via ORAL
  Filled 2022-03-22 (×3): qty 2
  Filled 2022-03-22: qty 1

## 2022-03-22 MED ORDER — THIAMINE HCL 100 MG/ML IJ SOLN
500.0000 mg | Freq: Three times a day (TID) | INTRAVENOUS | Status: DC
  Administered 2022-03-22 – 2022-03-24 (×7): 500 mg via INTRAVENOUS
  Filled 2022-03-22 (×12): qty 5

## 2022-03-22 MED ORDER — CARIPRAZINE HCL 1.5 MG PO CAPS
1.5000 mg | ORAL_CAPSULE | Freq: Every day | ORAL | Status: AC
  Administered 2022-03-22: 1.5 mg via ORAL
  Filled 2022-03-22: qty 1

## 2022-03-22 MED ORDER — SENNOSIDES-DOCUSATE SODIUM 8.6-50 MG PO TABS
1.0000 | ORAL_TABLET | ORAL | Status: DC
  Administered 2022-03-22: 1 via ORAL
  Filled 2022-03-22 (×2): qty 1

## 2022-03-22 MED ORDER — FUROSEMIDE 10 MG/ML IJ SOLN
40.0000 mg | Freq: Once | INTRAMUSCULAR | Status: AC
  Administered 2022-03-22: 40 mg via INTRAVENOUS
  Filled 2022-03-22: qty 4

## 2022-03-22 MED ORDER — ALBUTEROL SULFATE (2.5 MG/3ML) 0.083% IN NEBU: 2.5000 mg | INHALATION_SOLUTION | Freq: Once | RESPIRATORY_TRACT | Status: DC

## 2022-03-22 MED ORDER — POTASSIUM CHLORIDE CRYS ER 20 MEQ PO TBCR
20.0000 meq | EXTENDED_RELEASE_TABLET | Freq: Once | ORAL | Status: AC
  Administered 2022-03-22: 20 meq via ORAL
  Filled 2022-03-22: qty 1

## 2022-03-22 MED ORDER — POLYETHYLENE GLYCOL 3350 17 G PO PACK
17.0000 g | PACK | Freq: Every day | ORAL | Status: DC
  Administered 2022-03-22: 17 g via ORAL
  Filled 2022-03-22 (×2): qty 1

## 2022-03-22 MED ORDER — THIAMINE HCL 100 MG/ML IJ SOLN: 100.0000 mg | INTRAMUSCULAR | Status: DC

## 2022-03-22 MED ORDER — IBUPROFEN 400 MG PO TABS
400.0000 mg | ORAL_TABLET | Freq: Four times a day (QID) | ORAL | Status: DC | PRN
  Administered 2022-03-22: 400 mg via ORAL
  Filled 2022-03-22: qty 2

## 2022-03-22 MED ORDER — LIDOCAINE 5 % EX PTCH
1.0000 | MEDICATED_PATCH | CUTANEOUS | Status: DC
  Administered 2022-03-22 – 2022-03-24 (×3): 1 via TRANSDERMAL
  Filled 2022-03-22 (×3): qty 1

## 2022-03-22 MED ORDER — IOHEXOL 350 MG/ML SOLN
75.0000 mL | Freq: Once | INTRAVENOUS | Status: AC | PRN
  Administered 2022-03-22: 75 mL via INTRAVENOUS

## 2022-03-22 NOTE — Progress Notes (Signed)
eLink Physician-Brief Progress Note Patient Name: Jared [name] DOB: 01-Jun-1980 MRN: [id number]   Date of Service  03/22/2022  HPI/Events of Note  K+ 3.4  eICU Interventions  KCL 40 meq po x 1.        Thomasene Lot Mandeep Ferch 03/22/2022, 4:53 AM

## 2022-03-22 NOTE — Progress Notes (Addendum)
NAME:  Jared [name], MRN:  [id number], DOB:  February 11, 1981, LOS: 6 ADMISSION DATE:  03/16/2022, CONSULTATION DATE:  12/22 REFERRING MD:  Jearld Fenton, CHIEF COMPLAINT:  Septic Shock   History of Present Illness:  41 y/o male admitted in the context of septic shock from aspiration pneumonia after an intentional narcotic overdose.  Pertinent  Medical History  Prior overdose Opioid dependence Substance induced mood disorder Hep C  Hypertension  Significant Hospital Events: Including procedures, antibiotic start and stop dates in addition to other pertinent events   12/22 - ED > 3L Fluid received Azithromycin/Ceftriaxone, Vanco, HHFNC, NE started > Admitted to ICU Psyche consult> suicide precautions, don't let leave AMA, d/c to inpatient psyche facility; started phenobarbital  12/25 Increased WOB, Tm 103.68F, transfer back to ICU and broaden ABx coverage, try on Bipap; RSV pneumonia 12/27 Remains on precedex, 10L HFNC 12/28 off precedex  Interim History / Subjective:  Pt had was tachypneic overnight without hypoxia, D-dimer checked overnight and elevated to CTA chest ordered and was negative for PE Threatening comments to nursing overnight reported, remains IVC Precedex requirement coming down  On 10L  Objective   Blood pressure 113/75, pulse (!) 59, temperature 98.7 F (37.1 C), temperature source Oral, resp. rate (!) 21, height 5\' 9"  (1.753 m), weight 77.2 kg, SpO2 100 %.        Intake/Output Summary (Last 24 hours) at 03/22/2022 0817 Last data filed at 03/22/2022 0749 Gross per 24 hour  Intake 2473.75 ml  Output 3350 ml  Net -876.25 ml    Filed Weights   03/20/22 0447 03/21/22 0500 03/22/22 0500  Weight: 81.5 kg 80.8 kg 77.2 kg     General:  thin M, awake and calm HEENT: MM pink/moist, sclera anicteric, pupils equal Neuro: awake, calm, following breakfast, answering questions CV: s1s2 rrr, no m/r/g PULM:  scattered wheezing but good air movement bilaterally, no  tachypnea or accessory muscle use GI: soft, non-tender Extremities: warm/dry, no edema  Skin: no rashes or lesions  Labs  Na 134 K 3.4 BNP 104   Resolved Hospital Problem list     Assessment & Plan:   Acute respiratory failure with hypoxemia  Aspiration pneumonia RSV pneumonia Low grade fever  Moved to ICU out of concern may need intubated Overall looks better despite agitation and tachypnea overnight, calm and eating breakfast and says he feels fine, CTA done overnight without PE or significant infiltrate -Completed 5 days of Zosyn for agitation  -Blood cultures NGTD -schedule nebs -check UA, low grade fevers may be from agitation, substance withdrawal -PT/OT  -scheduled nebs     Acute metabolic encephalopathy from EtOH withdrawal Substance induced mood disorder and agitation Improved though though has some tachypnea and low grade fever -completed phenobarbital taper and off precedex -continue ativan and consider clonidine for opiate w/d -IVC, has suicide sitter  -psychiatry following, discussed increasing dose of Vraylar, will increase from 1.5mg  to 3.mg qd after discussion with pharmacy -continue high dose thiamine and folate     Acute systolic heart failure EF 30-35 % on echo, appears to be new, suspect substance induced -Lasix 40mg , cardiology consult      Best Practice (right click and "Reselect all SmartList Selections" daily)   Diet/type: Regular consistency (see orders)  DVT prophylaxis: LMWH GI prophylaxis: N/A Lines: N/A Foley:  N/A Code Status:  full code Last date of multidisciplinary goals of care discussion [pending, improving]    Critical care time: 32 minutes    CRITICAL CARE Performed  by: Otilio Carpen Aprille Sawhney   Total critical care time: 32 minutes  Critical care time was exclusive of separately billable procedures and treating other patients.  Critical care was necessary to treat or prevent imminent or life-threatening  deterioration.  Critical care was time spent personally by me on the following activities: development of treatment plan with patient and/or surrogate as well as nursing, discussions with consultants, evaluation of patient's response to treatment, examination of patient, obtaining history from patient or surrogate, ordering and performing treatments and interventions, ordering and review of laboratory studies, ordering and review of radiographic studies, pulse oximetry and re-evaluation of patient's condition.    Otilio Carpen Tore Carreker, PA-C [facility] See Amion for pager If no response to pager , please call [phone] until 7pm After 7:00 pm call Elink  [phone]?Naomi

## 2022-03-22 NOTE — Consult Note (Addendum)
Cardiology Consultation   Patient ID: Jared [name] MRN: [id number]; DOB: 11/08/1980  Admit date: 03/16/2022 Date of Consult: 03/22/2022  PCP:  Patient, No Pcp Per   [facility] Providers Cardiologist:  New  Patient Profile:   Jared [name] is a 41 y.o. male with a hx of HTN, Asthma, Hep C, polysubstance abuse/overdose  who is being seen 03/22/2022 for the evaluation of cardiomyopathy at the request of Dr. Kendrick Fries.  History of Present Illness:   Jared [name] is a 41 yo male with PMH noted above. Never by seen by cardiology in the past, denies any family hx. Psych hx of intentional overdose several years ago.   Presented to the ED on 12/22 after his mother found him down on the floor, unknown time. Heroin, xanax and wine found close by. On EMS arrival given narcan, noted to be hypoxic and hypotensive which required Nor-epi drip.   Labs on admission showed Na+ 135, K+ 4.3, Cr 1.14, Mag 1.6, hsTn 307, lactic 2.4, WBC 11.2, Hgb 17.5. RSV +. Required HFNC. Rectal temp 101.3. Admitted to PCCM for septic shock with intentional drug overdose. Treated with IV antibiotics, BC NTD.   Echo 03/16/2022 showed LVEF 30-35%, global hypokinesis, mildly reduced RV, normal RV size, no valvular disease.   Evaluated by psych with recommendations for transfer to inpatient psych facility once medically cleared.   Cardiology asked to evaluate.   Past Medical History:  Diagnosis Date   Asthma    Gastric ulcer    Hepatitis C    Hypertension     No past surgical history on file.   Home Medications:  Prior to Admission medications   Medication Sig Start Date End Date Taking? Authorizing Provider  ibuprofen (ADVIL) 200 MG tablet Take 800-1,000 mg by mouth as needed for moderate pain.   Yes [provider]  Cariprazine HCl (VRAYLAR) 1.5 MG CAPS Take 1 capsule (1.5 mg total) by mouth 2 (two) times daily before a meal. For mood control Patient not taking: Reported on  03/16/2022 01/11/17   Armandina Stammer I, NP  gabapentin (NEURONTIN) 300 MG capsule Take 1 capsule (300 mg total) by mouth 3 (three) times daily. For agitation Patient not taking: Reported on 03/16/2022 01/11/17   Armandina Stammer I, NP  hydrOXYzine (ATARAX/VISTARIL) 25 MG tablet Take 1 tablet (25 mg total) by mouth every 6 (six) hours as needed for anxiety. Patient not taking: Reported on 03/16/2022 01/11/17   Armandina Stammer I, NP  Multiple Vitamin (MULTIVITAMIN WITH MINERALS) TABS tablet Take 1 tablet by mouth daily. For low vitamin Patient not taking: Reported on 03/16/2022 01/12/17   Armandina Stammer I, NP  omeprazole (PRILOSEC) 40 MG capsule TAKE ONE CAPSULE BY MOUTH EVERY MORNING daily: For acid reflux Patient not taking: Reported on 03/16/2022 01/11/17   Armandina Stammer I, NP  traZODone (DESYREL) 50 MG tablet Take 1 tablet (50 mg total) by mouth at bedtime as needed for sleep. Patient not taking: Reported on 03/16/2022 01/11/17   Armandina Stammer I, NP    Inpatient Medications: Scheduled Meds:  albuterol  2.5 mg Nebulization Once   [START ON 03/23/2022] cariprazine  3 mg Oral Daily   Chlorhexidine Gluconate Cloth  6 each Topical Daily   enoxaparin (LOVENOX) injection  40 mg Subcutaneous Q24H   folic acid  1 mg Intravenous Daily   lidocaine  1 patch Transdermal Q24H   mouth rinse  15 mL Mouth Rinse 4 times per day   polyethylene glycol  17  g Oral Daily   senna-docusate  1 tablet Oral Q24H   [START ON 03/25/2022] thiamine (VITAMIN B1) injection  100 mg Intravenous Q24H   Continuous Infusions:  sodium chloride Stopped (03/20/22 0513)   dexmedetomidine (PRECEDEX) IV infusion Stopped (03/22/22 0743)   thiamine (VITAMIN B1) injection Stopped (03/22/22 1029)   PRN Meds: dextromethorphan-guaiFENesin, ibuprofen, LORazepam, menthol-cetylpyridinium, ondansetron (ZOFRAN) IV, mouth rinse, polyethylene glycol  Allergies:    Allergies  Allergen Reactions   Tylenol [Acetaminophen] Rash    Social History:    Social History   Socioeconomic History   Marital status: Single    Spouse name: Not on file   Number of children: Not on file   Years of education: Not on file   Highest education level: Not on file  Occupational History   Not on file  Tobacco Use   Smoking status: Former    Types: Cigarettes   Smokeless tobacco: Never  Substance and Sexual Activity   Alcohol use: No   Drug use: Yes    Types: IV    Comment: herion   Sexual activity: Not on file  Other Topics Concern   Not on file  Social History Narrative   Not on file   Social Determinants of Health   Financial Resource Strain: Not on file  Food Insecurity: Not on file  Transportation Needs: Not on file  Physical Activity: Not on file  Stress: Not on file  Social Connections: Not on file  Intimate Partner Violence: Not on file    Family History:    Family History  Problem Relation Age of Onset   Hypertension Father      ROS:  Please see the history of present illness.   All other ROS reviewed and negative.     Physical Exam/Data:   Vitals:   03/22/22 1130 03/22/22 1200 03/22/22 1230 03/22/22 1300  BP: (!) 89/79 120/76 129/83 117/67  Pulse: 83 78 71 79  Resp: (!) 33 (!) 28 (!) 36 (!) 30  Temp:  98.1 F (36.7 C)    TempSrc:  Oral    SpO2: 98% 95% 96% 96%  Weight:      Height:        Intake/Output Summary (Last 24 hours) at 03/22/2022 1409 Last data filed at 03/22/2022 1300 Gross per 24 hour  Intake 2086.58 ml  Output 4000 ml  Net -1913.42 ml      03/22/2022    5:00 AM 03/21/2022    5:00 AM 03/20/2022    4:47 AM  Last 3 Weights  Weight (lbs) 170 lb 3.1 oz 178 lb 2.1 oz 179 lb 10.8 oz  Weight (kg) 77.2 kg 80.8 kg 81.5 kg     Body mass index is 25.13 kg/m.  General:  Disheveled, sitter at bedside  HEENT: normal Neck: no JVD Vascular: No carotid bruits; Distal pulses 2+ bilaterally Cardiac:  normal S1, S2; RRR; no murmur  Lungs:  clear to auscultation bilaterally, no wheezing, rhonchi  or rales  Abd: soft, nontender, no hepatomegaly  Ext: no edema Musculoskeletal:  No deformities, BUE and BLE strength normal and equal Skin: warm and dry  Neuro:  CNs 2-12 intact, no focal abnormalities noted Psych:  Normal affect   EKG:  The EKG was personally reviewed and demonstrates:  12/22 Sinus Tachycardia, 113 bpm short PR interval  12/25 Sinus Rhythm, short PR interval Telemetry:  Telemetry was personally reviewed and demonstrates:  Sinus Rhythm  Relevant CV Studies:  Echo: 03/16/2022  IMPRESSIONS  1. Left ventricular ejection fraction, by estimation, is 30 to 35%. The  left ventricle has moderately decreased function. The left ventricle  demonstrates global hypokinesis. Left ventricular diastolic parameters are  indeterminate.   2. Right ventricular systolic function is mildly reduced. The right  ventricular size is normal. There is normal pulmonary artery systolic  pressure.   3. No evidence of mitral valve regurgitation.   4. The aortic valve is tricuspid. Aortic valve regurgitation is not  visualized.   5. The inferior vena cava is dilated in size with >50% respiratory  variability, suggesting right atrial pressure of 8 mmHg.   Comparison(s): No prior Echocardiogram.   FINDINGS   Left Ventricle: Left ventricular ejection fraction, by estimation, is 30  to 35%. The left ventricle has moderately decreased function. The left  ventricle demonstrates global hypokinesis. The left ventricular internal  cavity size was normal in size.  There is no left ventricular hypertrophy. Left ventricular diastolic  parameters are indeterminate.   Right Ventricle: The right ventricular size is normal. Right ventricular  systolic function is mildly reduced. There is normal pulmonary artery  systolic pressure. The tricuspid regurgitant velocity is 1.79 m/s, and  with an assumed right atrial pressure of   8 mmHg, the estimated right ventricular systolic pressure is [id number] mmHg.    Left Atrium: Left atrial size was normal in size.   Right Atrium: Right atrial size was normal in size.   Pericardium: There is no evidence of pericardial effusion.   Mitral Valve: No evidence of mitral valve regurgitation.   Tricuspid Valve: Tricuspid valve regurgitation is trivial.   Aortic Valve: The aortic valve is tricuspid. Aortic valve regurgitation is  not visualized.   Pulmonic Valve: Pulmonic valve regurgitation is not visualized.   Aorta: The aortic root and ascending aorta are structurally normal, with  no evidence of dilitation.   Venous: The inferior vena cava is dilated in size with greater than 50%  respiratory variability, suggesting right atrial pressure of 8 mmHg.   IAS/Shunts: No atrial level shunt detected by color flow Doppler.       Laboratory Data:  High Sensitivity Troponin:   Recent Labs  Lab 03/16/22 1155 03/16/22 2124 03/19/22 2208  TROPONINIHS 307* 387* 88*     Chemistry Recent Labs  Lab 03/19/22 2335 03/21/22 1045 03/22/22 0057 03/22/22 0910  NA 135 135 134*  --   K 3.3* 3.8 3.4*  --   CL 105 105 101  --   CO2 21* 24 24  --   GLUCOSE 119* 121* 108*  --   BUN 9 14 16   --   CREATININE 0.68 0.78 1.14  --   CALCIUM 7.8* 7.8* 7.4*  --   MG 1.8 2.1  --  2.2  GFRNONAA >60 >60 >60  --   ANIONGAP 9 6 9   --     Recent Labs  Lab 03/16/22 1155  PROT 6.3*  ALBUMIN 3.3*  AST 37  ALT 32  ALKPHOS 71  BILITOT 0.8   Lipids No results for input(s): "CHOL", "TRIG", "HDL", "LABVLDL", "LDLCALC", "CHOLHDL" in the last 168 hours.  Hematology Recent Labs  Lab 03/19/22 0344 03/19/22 2208 03/21/22 1045  WBC 25.8* 26.8* 11.9*  RBC 4.37 4.33 4.58  HGB 14.0 13.9 14.3  HCT 39.7 38.7* 41.0  MCV 90.8 89.4 89.5  MCH 32.0 32.1 31.2  MCHC 35.3 35.9 34.9  RDW 12.3 12.1 12.6  PLT 199 200 225   Thyroid No results for input(s): "TSH", "  FREET4" in the last 168 hours.  BNP Recent Labs  Lab 03/22/22 0057  BNP 104.6*    DDimer  Recent  Labs  Lab 03/21/22 2221  DDIMER 4.66*     Radiology/Studies:  CT Angio Chest Pulmonary Embolism (PE) W or WO Contrast  Result Date: 03/22/2022 CLINICAL DATA:  Pulmonary embolism suspected, with positive D-dimer. EXAM: CT ANGIOGRAPHY CHEST WITH CONTRAST TECHNIQUE: Multidetector CT imaging of the chest was performed using the standard protocol during bolus administration of intravenous contrast. Multiplanar CT image reconstructions and MIPs were obtained to evaluate the vascular anatomy. RADIATION DOSE REDUCTION: This exam was performed according to the departmental dose-optimization program which includes automated exposure control, adjustment of the mA and/or kV according to patient size and/or use of iterative reconstruction technique. CONTRAST:  67mL OMNIPAQUE IOHEXOL 350 MG/ML SOLN COMPARISON:  Recent chest films, from yesterday and December 22-25. Chest CT with IV contrast 12/11/2013 FINDINGS: Cardiovascular: The heart is slightly enlarged. There are no visible coronary artery calcifications. Small pericardial effusion new from the prior study. The aorta and great vessels are normal. The pulmonary veins are decompressed. There is a prominent pulmonary trunk 3.4 cm indicating arterial hypertension but no visible arterial embolus. Mediastinum/Nodes: Small hiatal hernia. There is moderate thickening of the distal thoracic esophagus consistent with esophagitis. There are mildly enlarged bilateral hilar lymph nodes, largest 1.4 cm in short axis. There are similarly prominent subcarinal, precarinal and right paratracheal space lymph nodes in the mediastinum. There are no enlarged axillary lymph nodes. No thyroid mass. There are mild retained secretions in the trachea versus aspirate. Both main bronchi are patent. Lungs/Pleura: There are symmetric small layering pleural effusions. There is no pneumothorax. There are patchy dense opacities throughout the lungs, denser and more confluent on the right than  left, with underlying interstitial thickening. There is relative but not complete sparing of the peripheral lungs, relative sparing in the left-greater-than-right bases. There are small scattered nodular alveolar shadows in the posterior bases. Diffuse bronchial thickening is also noted. There may be scattered small bronchiolar impactions in the basal lower lobes but this is difficult to confirm with the degree of respiratory motion. Upper Abdomen: No acute abnormality. Musculoskeletal: Slight thoracic kyphodextroscoliosis. No acute or significant osseous findings. There is body wall anasarca but no chest wall mass is seen. Review of the MIP images confirms the above findings. IMPRESSION: 1. No evidence of arterial embolus. 2. Prominent pulmonary trunk 3.4 cm indicating arterial hypertension. 3. Cardiomegaly with small pericardial effusion, small pleural effusions, and body wall anasarca. 4. Diffuse lung disease with patchy dense opacities denser and more confluent on the right than left, with underlying interstitial thickening and small scattered nodular alveolar shadows in the posterior bases. Findings could be due to pulmonary edema, atypical infection, or combination. 5. Diffuse bronchial thickening with possible scattered small bronchiolar impactions in the basal lower lobes but this is difficult to confirm with the degree of respiratory motion. 6. Small hiatal hernia with moderate thickening of the distal thoracic esophagus consistent with esophagitis. 7. Mildly enlarged hilar and mediastinal lymph nodes, possibly reactive but nonspecific. 8. Retained secretions versus aspirate in the trachea. Electronically Signed   By: Telford Nab M.D.   On: 03/22/2022 02:15   DG Chest Port 1 View  Result Date: 03/21/2022 CLINICAL DATA:  10027.  Tachypnea.  Drug overdose. EXAM: PORTABLE CHEST 1 VIEW COMPARISON:  Portable chest 03/19/2022 FINDINGS: The cardiac size is normal.  Central vascular prominence is similar.  Widespread right-greater-than-left airspace disease is  still seen with relative left apical and basal sparing, but is mildly improved and less confluent and in general less dense than previously. Underlying interstitial prominence appears similar. There are trace pleural effusions, also unchanged. The mediastinum is normally outlined. Thoracic cage is intact. IMPRESSION: 1. Widespread right-greater-than-left airspace disease is mildly improved. Underlying interstitial prominence and central vascular prominence appears similar. No new findings. 2. Trace pleural effusions. Electronically Signed   By: Telford Nab M.D.   On: 03/21/2022 21:57   DG CHEST PORT 1 VIEW  Result Date: 03/19/2022 CLINICAL DATA:  Respiratory distress EXAM: PORTABLE CHEST 1 VIEW COMPARISON:  03/17/2022 FINDINGS: Stable heart size. Extensive bilateral airspace opacities. Little interval change from prior. No large pleural fluid collection. No pneumothorax. IMPRESSION: Extensive bilateral airspace opacities, similar to prior. Electronically Signed   By: Davina Poke D.O.   On: 03/19/2022 21:43     Assessment and Plan:   HAOYU FARNAN is a 41 y.o. male with a hx of HTN, Hep C, polysubstance abuse/overdose  who is being seen 03/22/2022 for the evaluation of cardiomyopathy at the request of Dr. Lake Bells.  HFrEF -- in setting of unknown downtime/overdose, hypoxia and RSV pneumonia. Likely non-ischemic, stress cardiomyopathy?. CT angio negative for PE, cardiomyopathy with small pericardial effusion, small pleural effusion -- s/p IV lasix this morning, net - 4.4L -- initially hypotensive regarding nor-epi on admission, but BP has improved -- ideally add GDMT once recovered from acute illness -- likely repeat echo as an outpatient in several months to determine if EF improved  Acute hypoxic respiratory failure RSV Aspiration Pneumonia -- CT angio negative for PE -- IV antibiotics per primary  Acute metabolic  encephalopathy ETOH/polysubstance abuse Overdose -- evaluated by psych, IVC with plans for inpatient psych treatment once medically cleared  Risk Assessment/Risk Scores:      For questions or updates, please contact [place] Please consult [email or web address] for contact info under    Signed, Reino Bellis, NP  03/22/2022 2:09 PM   Attending Note:   The patient was seen and examined.  Agree with assessment and plan as noted above.  Changes made to the above note as needed.  Patient seen and independently examined with Reino Bellis, NP .   We discussed all aspects of the encounter. I agree with the assessment and plan as stated above.   HFrEF: Patient has a history of polysubstance abuse/overdose.  I suspect that his reduced LV function is due to nonischemic cardiomyopathy.  Could be a stress cardiomyopathy from his recent overdose.  CT angio is negative for pulmonary embolus.  Will need to get him over his acute illness.  Will consider adding GDMT once he is recovered.  2.  Acute hypoxic respiratory failure: He likely has aspiration pneumonia.  He also has RSV.  CTA is negative for PE.  Continue antibiotics per primary team.  3.  Substance abuse: My understanding is that the patient would be discharged to behavioral health following medical stabilization here at the [facility].  He says that he is not going to behavioral health.  I will leave that discussion up to the critical care team and medical team.    I have spent a total of 40 minutes with patient reviewing hospital  notes , telemetry, EKGs, labs and examining patient as well as establishing an assessment and plan that was discussed with the patient.  > 50% of time was spent in direct patient care.    Ramond Dial., MD,  [facility] 03/22/2022, 3:54 PM [address]  [place] Pager [phone]

## 2022-03-22 NOTE — Progress Notes (Signed)
Pharmacy Electrolyte Replacement  Recent Labs:  Recent Labs    03/21/22 1045 03/22/22 0057  K 3.8 3.4*  MG 2.1  --   CREATININE 0.78 1.14    Low Critical Values (K </= 2.5, Phos </= 1, Mg </= 1) Present: None  MD Contacted: Laura Gleason, PA  Assessment: Given KCl po x1.   Plan: Give another KCl po x1. Rechecked Magnesium - resulted within normal limits.   Link Snuffer, PharmD, BCPS, BCCCP Please refer to [facility] for [facility] Pharmacy numbers 03/22/2022, 10:27 AM

## 2022-03-22 NOTE — Consult Note (Signed)
[facility] Face-to-Face Psychiatry Consult   Reason for Consult:  intentional overdose Referring Physician:  No ref. provider found  NP Hoffman Patient Identification: Jared [name] MRN:  [id number] Principal Diagnosis: Intentional heroin overdose [facility]) Diagnosis:  Principal Problem:   Intentional heroin overdose (HCC) Active Problems:   Polysubstance dependence including opioid type drug, continuous use (HCC)   Aspiration pneumonia (HCC)   Acute hypoxic respiratory failure (HCC)   Septic shock (HCC)   Alteration in electrolyte and fluid balance   ARDS (adult respiratory distress syndrome) (HCC)   Opiate overdose (HCC)   AKI (acute kidney injury) (HCC)   Hypomagnesemia   Hypophosphatemia   Acute respiratory failure with hypoxia (HCC)   Total Time spent with patient: 30 minutes  Subjective:   Jared [name] is a 41 y.o. year old male with a history of polysubstance abuse and substance-induced mood disorder, who is admitted with intentional overdose on heroin, xanax, alcohol.   HPI: Patient seen and reassessed in intensive care, he seemingly appears to be appropriate, respiratory rate under control nasal cannula in place.  Patient is seen and discussed concerns regarding intentional overdose, in which he adamantly denies intentional suicide attempt.  He states he recalls using the drugs fentanyl and heroin, and drinking wine however had no suicidal intent.  He states prior to coming to the hospital he denies any suicidal ideations, suicidal thoughts or suicidal behaviors.  He further denies any recent suicide attempts that resulted in inpatient hospitalization or ongoing medical needs.  Patient was unaware that combining both of those illicit substances together would increase his risk for becoming unconscious and resulting in him being admitted to the hospital.  He is agreeable to continue to treat and monitor during medical admission, however denies need for inpatient psychiatric services.   He cites his family to include child and mother who are in need of his help at this time.  On today's reassessment patient did not present with any focal neurological deficit, he was able to speak clearly, follow commands, and denies weakness.   At this current time, patient's pre-existing condition that was substantially aggravated prior to this admission has returned to previous level.  Patient further states that his current health at this present time seems to be normal as he is noted to feel better and like his self again.  He does continue to endorse some mental fog, overall assessing is doing better.  He does not present with any decrease in attention or concentration, he is alert and oriented x 4, shows no short-term memory deficit.  Although he is unable to recall his previous actions that resulted in hospitalization, and or confabulation and his delusions.  He is not lethargic on today's exam, shows linear thought processes, and answers all questions appropriately.  At this time it is felt that patient is returning to psychiatric baseline, and may no longer warrant inpatient psychiatric hospitalization.  He denies suicidal ideation, homicidal ideation, and or auditory or visual hallucinations.  Will continue to treat and monitor during this hospitalization.   Past Psychiatric History:  Dx: opioid use d/o, benzodiazepine use d/o, alcohol use d/o, substance-induced mood disorder. Reports h/o past suicidal attempts via OD on drugs. H/o past psych admissions; last 2018. Was at [facility] for Tx of addiction. No recent psych medications. Past psych medications: Vraylar, Gabapentin, Trazodone.   Risk to Self:  yes Risk to Others:  no Prior Inpatient Therapy:   Prior Outpatient Therapy:    Past Medical History:  Past  Medical History:  Diagnosis Date   Asthma    Gastric ulcer    Hepatitis C    Hypertension    No past surgical history on file. Family History:  Family History  Problem  Relation Age of Onset   Hypertension Father    Family Psychiatric  History: unable to obtain due to patient mental status. Social History:  Social History   Substance and Sexual Activity  Alcohol Use No     Social History   Substance and Sexual Activity  Drug Use Yes   Types: IV   Comment: herion    Social History   Socioeconomic History   Marital status: Single    Spouse name: Not on file   Number of children: Not on file   Years of education: Not on file   Highest education level: Not on file  Occupational History   Not on file  Tobacco Use   Smoking status: Former    Types: Cigarettes   Smokeless tobacco: Never  Substance and Sexual Activity   Alcohol use: No   Drug use: Yes    Types: IV    Comment: herion   Sexual activity: Not on file  Other Topics Concern   Not on file  Social History Narrative   Not on file   Social Determinants of Health   Financial Resource Strain: Not on file  Food Insecurity: Not on file  Transportation Needs: Not on file  Physical Activity: Not on file  Stress: Not on file  Social Connections: Not on file   Additional Social History:    Allergies:   Allergies  Allergen Reactions   Tylenol [Acetaminophen] Rash    Labs:  Results for orders placed or performed during the hospital encounter of 03/16/22 (from the past 48 hour(s))  CBC     Status: Abnormal   Collection Time: 03/21/22 10:45 AM  Result Value Ref Range   WBC 11.9 (H) 4.0 - 10.5 K/uL   RBC 4.58 4.22 - 5.81 MIL/uL   Hemoglobin 14.3 13.0 - 17.0 g/dL   HCT 54.6 56.8 - 12.7 %   MCV 89.5 80.0 - 100.0 fL   MCH 31.2 26.0 - 34.0 pg   MCHC 34.9 30.0 - 36.0 g/dL   RDW 51.7 00.1 - 74.9 %   Platelets 225 150 - 400 K/uL   nRBC 0.0 0.0 - 0.2 %    Comment: Performed at [facility], [address] [place], [place] [address]  Basic metabolic panel     Status: Abnormal   Collection Time: 03/21/22 10:45 AM  Result Value Ref Range   Sodium 135 135 - 145 mmol/L    Potassium 3.8 3.5 - 5.1 mmol/L   Chloride 105 98 - 111 mmol/L   CO2 24 22 - 32 mmol/L   Glucose, Bld 121 (H) 70 - 99 mg/dL    Comment: Glucose reference range applies only to samples taken after fasting for at least 8 hours.   BUN 14 6 - 20 mg/dL   Creatinine, Ser 5.91 0.61 - 1.24 mg/dL   Calcium 7.8 (L) 8.9 - 10.3 mg/dL   GFR, Estimated >63 >84 mL/min    Comment: (NOTE) Calculated using the CKD-EPI Creatinine Equation (2021)    Anion gap 6 5 - 15    Comment: Performed at [facility], [address], [place], [place] [address]  Magnesium     Status: None   Collection Time: 03/21/22 10:45 AM  Result Value Ref  Range   Magnesium 2.1 1.7 - 2.4 mg/dL    Comment: Performed at [facility], [address], [place], [place] [address]  D-dimer, quantitative     Status: Abnormal   Collection Time: 03/21/22 10:21 PM  Result Value Ref Range   D-Dimer, Quant 4.66 (H) 0.00 - 0.50 ug/mL-FEU    Comment: (NOTE) At the manufacturer cut-off value of 0.5 g/mL FEU, this assay has a negative predictive value of 95-100%.This assay is intended for use in conjunction with a clinical pretest probability (PTP) assessment model to exclude pulmonary embolism (PE) and deep venous thrombosis (DVT) in outpatients suspected of PE or DVT. Results should be correlated with clinical presentation. Performed at [facility], [address] [place], [place] [address]   Lactic acid, plasma     Status: Abnormal   Collection Time: 03/21/22 10:21 PM  Result Value Ref Range   Lactic Acid, Venous 2.3 (HH) 0.5 - 1.9 mmol/L    Comment: CRITICAL RESULT CALLED TO, READ BACK BY AND VERIFIED WITH L.TZINTZUN,RN. [id number] 03/21/22. LPAIT Performed at [facility], [address], [place], [place] [address]   Beta-hydroxybutyric acid     Status: None   Collection Time: 03/21/22 10:21 PM  Result Value Ref Range   Beta-Hydroxybutyric Acid 0.05 0.05 - 0.27 mmol/L    Comment: Performed at [facility], [address], [place], [place] [address]  Blood gas, arterial     Status: Abnormal   Collection Time: 03/21/22 10:35 PM  Result Value Ref Range   pH, Arterial 7.53 (H) 7.35 - 7.45   pCO2 arterial 32 32 - 48 mmHg   pO2, Arterial 77 (L) 83 - 108 mmHg   Bicarbonate 26.7 20.0 - 28.0 mmol/L   Acid-Base Excess 4.4 (H) 0.0 - 2.0 mmol/L   O2 Saturation 97.8 %   Patient temperature 37.1    Collection site LEFT RADIAL    Drawn by [id number]    Allens test (pass/fail) PASS PASS    Comment: Performed at [facility], [address], [place], [place] [address]  Rapid urine drug screen (hospital performed)     Status: Abnormal   Collection Time: 03/22/22 12:53 AM  Result Value Ref Range   Opiates POSITIVE (A) NONE DETECTED   Cocaine NONE DETECTED NONE DETECTED   Benzodiazepines POSITIVE (A) NONE DETECTED   Amphetamines NONE DETECTED NONE DETECTED   Tetrahydrocannabinol POSITIVE (A) NONE DETECTED   Barbiturates POSITIVE (A) NONE DETECTED    Comment: (NOTE) DRUG SCREEN FOR MEDICAL PURPOSES ONLY.  IF CONFIRMATION IS NEEDED FOR ANY PURPOSE, NOTIFY LAB WITHIN 5 DAYS.  LOWEST DETECTABLE LIMITS FOR URINE DRUG SCREEN Drug Class                     Cutoff (ng/mL) Amphetamine and metabolites    1000 Barbiturate and metabolites    200 Benzodiazepine                 200 Opiates and metabolites        300 Cocaine and metabolites        300 THC                            50 Performed at [facility], [address], [place], [place] [address]   Basic metabolic panel     Status: Abnormal   Collection Time: 03/22/22 12:57 AM  Result Value Ref Range   Sodium 134 (L) 135 - 145 mmol/L   Potassium 3.4 (L) 3.5 - 5.1 mmol/L   Chloride 101 98 - 111 mmol/L   CO2 24 22 - 32 mmol/L   Glucose, Bld 108 (H) 70 - 99 mg/dL    Comment: Glucose reference range applies only to samples taken after fasting for at least 8 hours.   BUN 16 6 - 20 mg/dL   Creatinine, Ser 6.96 0.61 - 1.24 mg/dL    Calcium 7.4 (L) 8.9 - 10.3 mg/dL   GFR, Estimated >29 >52 mL/min    Comment: (NOTE) Calculated using the CKD-EPI Creatinine Equation (2021)    Anion gap 9 5 - 15    Comment: Performed at [facility], [address] [place], [place] [address]  Brain natriuretic peptide     Status: Abnormal   Collection Time: 03/22/22 12:57 AM  Result Value Ref Range   B Natriuretic Peptide 104.6 (H) 0.0 - 100.0 pg/mL    Comment: Performed at [facility], [address], [place], [place] [address]  Magnesium     Status: None   Collection Time: 03/22/22  9:10 AM  Result Value Ref Range   Magnesium 2.2 1.7 - 2.4 mg/dL    Comment: Performed at [facility], [address], [place], [place] [address]  Urinalysis, Routine w reflex microscopic Urine, Clean Catch     Status: None   Collection Time: 03/22/22 12:13 PM  Result Value Ref Range   Color, Urine YELLOW YELLOW   APPearance CLEAR CLEAR   Specific Gravity, Urine 1.020 1.005 - 1.030   pH 6.0 5.0 - 8.0   Glucose, UA NEGATIVE NEGATIVE mg/dL   Hgb urine dipstick NEGATIVE NEGATIVE   Bilirubin Urine NEGATIVE NEGATIVE   Ketones, ur NEGATIVE NEGATIVE mg/dL   Protein, ur NEGATIVE NEGATIVE mg/dL   Nitrite NEGATIVE NEGATIVE   Leukocytes,Ua NEGATIVE NEGATIVE    Comment: Performed at [facility], [address] [place], [place] [address]    Current Facility-Administered Medications  Medication Dose Route Frequency Provider Last Rate Last Admin   0.9 %  sodium chloride infusion  250 mL Intravenous Continuous Simonne Martinet, NP   Stopped at 03/20/22 0513   albuterol (PROVENTIL) (2.5 MG/3ML) 0.083% nebulizer solution 2.5 mg  2.5 mg Nebulization Once Gleason, Darcella Gasman, PA-C       [START ON 03/23/2022] cariprazine (VRAYLAR) capsule 3 mg  3 mg Oral Daily Gleason, Darcella Gasman, PA-C       Chlorhexidine Gluconate Cloth 2 % PADS 6 each  6 each Topical Daily Leeroy Bock, MD   6 each at 03/22/22 1200   dexmedetomidine (PRECEDEX) 400  MCG/100ML (4 mcg/mL) infusion  0.4-1.2 mcg/kg/hr Intravenous Titrated [name], MD   Stopped at 03/22/22 0743   dextromethorphan-guaiFENesin (MUCINEX DM) 30-600 MG per 12 hr tablet 1 tablet  1 tablet Oral BID PRN Leeroy Bock, MD   1 tablet at 03/21/22 1535   enoxaparin (LOVENOX) injection 40 mg  40 mg Subcutaneous Q24H Olalere, Adewale A, MD   40 mg at 03/22/22 1121   folic acid injection 1 mg  1 mg Intravenous Daily Max Fickle B, MD   1 mg at 03/22/22 0944   ibuprofen (ADVIL) tablet 400 mg  400 mg Oral Q6H PRN Gleason, Darcella Gasman, PA-C   400 mg at 03/22/22 1000   lidocaine (LIDODERM) 5 % 1 patch  1 patch Transdermal Q24H  Gleason, Darcella Gasman, PA-C   1 patch at 03/22/22 4098   LORazepam (ATIVAN) injection 2 mg  2 mg Intravenous Q4H PRN Leeroy Bock, MD   2 mg at 03/22/22 1240   menthol-cetylpyridinium (CEPACOL) lozenge 3 mg  1 lozenge Oral PRN Leeroy Bock, MD   3 mg at 03/21/22 1956   ondansetron (ZOFRAN) injection 4 mg  4 mg Intravenous Q6H PRN Simonne Martinet, NP   4 mg at 03/18/22 2330   Oral care mouth rinse  15 mL Mouth Rinse 4 times per day Leeroy Bock, MD   15 mL at 03/22/22 1127   Oral care mouth rinse  15 mL Mouth Rinse PRN Leeroy Bock, MD       polyethylene glycol (MIRALAX / GLYCOLAX) packet 17 g  17 g Oral Daily PRN Simonne Martinet, NP       polyethylene glycol (MIRALAX / GLYCOLAX) packet 17 g  17 g Oral Daily Gleason, Darcella Gasman, PA-C   17 g at 03/22/22 1121   senna-docusate (Senokot-S) tablet 1 tablet  1 tablet Oral Q24H Gleason, Darcella Gasman, PA-C   1 tablet at 03/22/22 1121   thiamine (VITAMIN B1) 500 mg in normal saline (50 mL) IVPB  500 mg Intravenous TID Gleason, Darcella Gasman, PA-C   Stopped at 03/22/22 1029   Followed by   Melene Muller ON 03/25/2022] thiamine (VITAMIN B1) injection 100 mg  100 mg Intravenous Q24H Gleason, Darcella Gasman, PA-C        Musculoskeletal: Strength & Muscle Tone: decreased Gait & Station:  N/A (lying in the bed Patient leans:  N/A            Psychiatric Specialty Exam:  Presentation  General Appearance: Appropriate for Environment; Casual  Eye Contact:Fair  Speech:Clear and Coherent; Normal Rate  Speech Volume:Normal  Handedness:No data recorded  Mood and Affect  Mood:Euthymic  Affect:Appropriate; Congruent   Thought Process  Thought Processes:Coherent; Linear  Descriptions of Associations:Intact Orientation:Full (Time, Place and Person)  Thought Content:Logical History of Schizophrenia/Schizoaffective disorder:No data recorded Duration of Psychotic Symptoms:No data recorded Hallucinations:Hallucinations: None  Ideas of Reference:None  Suicidal Thoughts:Suicidal Thoughts: No  Homicidal Thoughts:Homicidal Thoughts: No   Sensorium  Memory:Immediate Fair; Recent Fair; Remote Fair  Judgment:Fair  Insight:Poor   Executive Functions  Concentration:Fair  Attention Span:Fair  Recall:Fair  Fund of Knowledge:Fair  Language:Fair   Psychomotor Activity  Psychomotor Activity:Psychomotor Activity: Normal Normal tone, no rigidity, no resting/postural tremors, no tardive dyskinesia    Assets  Assets:Communication Skills; Desire for Improvement; Financial Resources/Insurance; Resilience; Social Support  Sleep  Sleep:Sleep: Fair   Physical Exam: Physical Exam Vitals and nursing note reviewed.  Constitutional:      Appearance: Normal appearance. He is normal weight.  Neurological:     General: No focal deficit present.     Mental Status: He is alert and oriented to person, place, and time. Mental status is at baseline.  Psychiatric:        Mood and Affect: Mood normal.        Behavior: Behavior normal.        Thought Content: Thought content normal.        Judgment: Judgment normal.    Review of Systems  Unable to perform ROS: Mental status change  Psychiatric/Behavioral:  Positive for substance abuse. The patient is nervous/anxious.    Blood pressure 115/78,  pulse 78, temperature 98.1 F (36.7 C), temperature source Oral, resp. rate 17, height 5\' 9"  (1.753 m), weight 77.2  kg, SpO2 98 %. Body mass index is 25.13 kg/m.   Assessment Jared [name] is a 41 y.o. year old male with a history of polysubstance abuse and substance-induced mood disorder, who is admitted with a J intentional overdose on heroin, xanax, alcohol. He is treated for sepsis likely secondary to aspiration/multifactorial pneumonia, Acute hypoxic respiratory failure.  Psychiatry was consulted due to intentional overdose of opioid with history of same and known substance-induced mood disorder.  Patient mental status has improved, he is now clear and coherent much more alert and engaging well with psychiatric team and nursing staff.  He currently denies his admission as a intentional suicide attempt via overdose.  # Delirium-improving.  There does appear to be some ongoing waxing and waning of mental status.  As per primary team" patient is discussing weapons of mass destruction, threatening of nursing staff during the night" however there appear to be no documentation from nursing describing these facts.  Will continue to assess daily for ongoing psychiatric management during this hospitalization # substance induced mood disorder-Will start gabapentin 300 mg p.o. twice daily.  Clonidine/Cows protocol and another option, however patient recently diagnosed with heart failure and is pending cardiology consult. # Intentional overdose-currently denies, mentation has improved.  Reports being under the influence when he made suicidal statements in the emergency room. # polysubstance use (heroin, fentanyl) Although he denies overdosing as suicide attempt/denies current SI during the interview today. According to the chart review, he did report depressive symptoms. Psychosocial stressors including recent release from prison, lose of custody of his child,  and loss of wife. Based on the suicide risk  assessment as below, risk factors will change, will continue to evaluate daily to further assess need for inpatient psychiatric hospitalization.  Patient agrees with this plan currently.  Recommendation - 1:1 sitter 24/7, do not discharge even [facility] clothing only, no personal belongings in room - COWS-protocol for acute opioid withdrawal,  -Increase Vraylar 3 mg daily (home meds, EKG 385 msec on 12/22).  Start gabapentin 300 mg p.o. twice daily. - Continue to monitor and treat underlying medical causes of delirium, including infection, electrolyte disturbances, etc. - Delirium precautions - Minimize/avoid deliriogenic meds including: anticholinergic, opiates, benzodiazepines           - Maintain hydration, oxygenation, nutrition           - Limit use of restraints and catheters           - Normalize sleep patterns by minimizing nighttime noise, light and interruptions by                clustering care, opening blinds during the day           - Reorient the patient frequently, provide easily visible clock and calendar           - Provide sensory aids like glasses, hearing aids           - Encourage ambulation, regular activities and visitors to maintain cognitive stimulation    Treatment Plan Summary: Plan see above  Disposition: No evidence of imminent risk to self or others at present.   Patient does not meet criteria for psychiatric inpatient admission. Supportive therapy provided about ongoing stressors. Discussed crisis plan, support from social network, calling 911, coming to the Emergency Department, and calling Suicide Hotline. Psychiatry consult service to continue to follow for additional 1 to 2 days.  Suicide assessment  The patient demonstrates the following risk factors for suicide:  Chronic risk factors for suicide include: psychiatric disorder of depression, substance use disorder, and previous suicide attempts of overdosing heroin . Acute risk factors for suicide  include: social withdrawal/isolation and loss (financial, interpersonal, professional). Protective factors for this patient include:  N/A . Considering these factors, the overall suicide risk at this point appears to be moderate.  Patient currently denies overdose intentional, he may no longer warrant inpatient psychiatric hospitalization.  Thank you for your consult. We will continue to follow this patient.   Maryagnes Amos, FNP 03/22/2022 2:31 PM

## 2022-03-22 NOTE — TOC Progression Note (Signed)
Transition of Care [facility]) - Initial/Assessment Note    Patient Details  Name: Jared [name] MRN: [id number] Date of Birth: December 26, 1980  Transition of Care [facility]) CM/SW Contact:    Ralene Bathe, LCSWA Phone Number: 03/22/2022, 1:03 PM  Clinical Narrative:                 LCSW received a call from Durwin Nora case manager with [employer].  [phone]).  The insurance company requested that LCSW provide the family with their contact information so that [employer] could assist with nursing support services after the patient discharges.    LCSW contacted the patient's mother.  There was no answer.  LCSW left a VM requesting a returned call.          Patient Goals and CMS Choice            Expected Discharge Plan and Services                                              Prior Living Arrangements/Services                       Activities of Daily Living      Permission Sought/Granted                  Emotional Assessment              Admission diagnosis:  Acute respiratory failure with hypoxia (HCC) [J96.01] Septic shock (HCC) [A41.9, R65.21] Opiate overdose, undetermined intent, initial encounter (HCC) [T40.604A] Patient Active Problem List   Diagnosis Date Noted   Opiate overdose (HCC) 03/18/2022   AKI (acute kidney injury) (HCC) 03/18/2022   Hypomagnesemia 03/18/2022   Hypophosphatemia 03/18/2022   Acute respiratory failure with hypoxia (HCC) 03/18/2022   Aspiration pneumonia (HCC) 03/16/2022   Acute hypoxic respiratory failure (HCC) 03/16/2022   Septic shock (HCC) 03/16/2022   Intentional heroin overdose (HCC) 03/16/2022   Alteration in electrolyte and fluid balance 03/16/2022   ARDS (adult respiratory distress syndrome) (HCC) 03/16/2022   Polysubstance dependence including opioid type drug, continuous use (HCC) 01/08/2017   Substance induced mood disorder (HCC) 01/08/2017   PCP:  Patient, No Pcp Per Pharmacy:    [id number] [place], [place] - [address] [place] [address] Phone: [phone] Fax: [phone]  [id number] [place], [place] - [address] AT [facility] [address] [place] [address] Phone: [phone] Fax: [phone]     Social Determinants of Health (SDOH) Social History: SDOH Screenings   Alcohol Screen: Medium Risk (01/30/2017)  Tobacco Use: Medium Risk (04/05/2020)   SDOH Interventions:     Readmission Risk Interventions     No data to display

## 2022-03-23 ENCOUNTER — Other Ambulatory Visit (HOSPITAL_COMMUNITY): Payer: Self-pay

## 2022-03-23 ENCOUNTER — Encounter (HOSPITAL_COMMUNITY): Payer: Self-pay | Admitting: Pulmonary Disease

## 2022-03-23 DIAGNOSIS — R6521 Severe sepsis with septic shock: Secondary | ICD-10-CM

## 2022-03-23 DIAGNOSIS — J9601 Acute respiratory failure with hypoxia: Secondary | ICD-10-CM | POA: Diagnosis not present

## 2022-03-23 DIAGNOSIS — T401X2D Poisoning by heroin, intentional self-harm, subsequent encounter: Secondary | ICD-10-CM

## 2022-03-23 DIAGNOSIS — A419 Sepsis, unspecified organism: Secondary | ICD-10-CM | POA: Diagnosis not present

## 2022-03-23 DIAGNOSIS — J690 Pneumonitis due to inhalation of food and vomit: Secondary | ICD-10-CM | POA: Diagnosis not present

## 2022-03-23 DIAGNOSIS — T40604A Poisoning by unspecified narcotics, undetermined, initial encounter: Secondary | ICD-10-CM | POA: Diagnosis not present

## 2022-03-23 LAB — CBC
HCT: 43.4 % (ref 39.0–52.0)
Hemoglobin: 15.3 g/dL (ref 13.0–17.0)
MCH: 31 pg (ref 26.0–34.0)
MCHC: 35.3 g/dL (ref 30.0–36.0)
MCV: 88 fL (ref 80.0–100.0)
Platelets: 300 10*3/uL (ref 150–400)
RBC: 4.93 MIL/uL (ref 4.22–5.81)
RDW: 12.5 % (ref 11.5–15.5)
WBC: 13.1 10*3/uL — ABNORMAL HIGH (ref 4.0–10.5)
nRBC: 0 % (ref 0.0–0.2)

## 2022-03-23 LAB — BASIC METABOLIC PANEL
Anion gap: 11 (ref 5–15)
BUN: 10 mg/dL (ref 6–20)
CO2: 24 mmol/L (ref 22–32)
Calcium: 8.1 mg/dL — ABNORMAL LOW (ref 8.9–10.3)
Chloride: 101 mmol/L (ref 98–111)
Creatinine, Ser: 0.68 mg/dL (ref 0.61–1.24)
GFR, Estimated: >60 mL/min (ref >60)
Glucose, Bld: 105 mg/dL — ABNORMAL HIGH (ref 70–99)
Potassium: 3.9 mmol/L (ref 3.5–5.1)
Sodium: 136 mmol/L (ref 135–145)

## 2022-03-23 LAB — MAGNESIUM: Magnesium: 1.9 mg/dL (ref 1.7–2.4)

## 2022-03-23 MED ORDER — HALOPERIDOL LACTATE 5 MG/ML IJ SOLN: 5.0000 mg | Freq: Four times a day (QID) | INTRAMUSCULAR | Status: DC | PRN

## 2022-03-23 MED ORDER — HALOPERIDOL 1 MG PO TABS: 5.0000 mg | ORAL_TABLET | Freq: Four times a day (QID) | ORAL | Status: DC | PRN

## 2022-03-23 MED ORDER — DIPHENHYDRAMINE HCL 50 MG/ML IJ SOLN: 50.0000 mg | Freq: Four times a day (QID) | INTRAMUSCULAR | Status: DC | PRN

## 2022-03-23 MED ORDER — DIPHENHYDRAMINE HCL 25 MG PO CAPS: 50.0000 mg | ORAL_CAPSULE | Freq: Four times a day (QID) | ORAL | Status: DC | PRN

## 2022-03-23 NOTE — Consult Note (Addendum)
[facility] Face-to-Face Psychiatry Consult   Reason for Consult:  intentional overdose Referring Physician:  No ref. provider found  NP Hoffman Patient Identification: Jared [name] MRN:  [id number] Principal Diagnosis: Intentional heroin overdose [facility]) Diagnosis:  Principal Problem:   Intentional heroin overdose (HCC) Active Problems:   Polysubstance dependence including opioid type drug, continuous use (HCC)   Aspiration pneumonia (HCC)   Acute hypoxic respiratory failure (HCC)   Septic shock (HCC)   Alteration in electrolyte and fluid balance   ARDS (adult respiratory distress syndrome) (HCC)   Opiate overdose (HCC)   AKI (acute kidney injury) (HCC)   Hypomagnesemia   Hypophosphatemia   Acute respiratory failure with hypoxia (HCC)   Total Time spent with patient: 30 minutes  Subjective:   Jared [name] is a 41 y.o. year old male with a history of polysubstance abuse and substance-induced mood disorder, who is admitted with intentional overdose on heroin, xanax, alcohol.   12/29 On evaluation today, the patient reports that he was in prison for many years and was released several months ago.  He reports going to live at his mother's house and states that they have had a severe conflict.  He reports that he has done everything possible to help her but that she makes unreasonable demands of him.  He states that she has severe dementia and is on "72 pills that she does not need".  He reiterates his statement from yesterday that he was not trying to kill himself with the overdose on heroin.  He states that he wants to return home and live with his mother and (when prompted) states he is interested in enrolling in substance use treatment on an outpatient basis.  Called the patient's mother with the patient's consent.  Jared [name], [phone].  She exhibits a linear and logical thought process and is clearly fully alert and oriented with intact attention, inconsistent with a diagnosis of  dementia.  She reports that her son cannot return home to live with her.  She states that she is tired of his drug use and him stealing her jewelry to pay for drugs.  She states that his 58 year old son blocked his number after the patient cursed at him on the phone and that this has made the patient upset recently. She reports that he has been depressed recently and on the day of the overdose stated "I want you all to see me dead".  She feels his overdose was a suicide attempt.  She reports that he is been threatening to kill her recently and to burn down the house.  Discussed my conversation with his mother with the patient.  He states that she is lying.  He made homicidal statements about her to this provider and appeared quite upset about going to the behavioral health hospital.  Security was notified and came to talk to the patient.  The patient was IVC'd and this has been continued.  Recommend inpatient psychiatric hospitalization on an involuntary basis given the patient's likely suicide attempt via overdose and his homicidal statements towards his mother.  Agitation medications ordered.  Clearly the patient's homicidal statements towards his mother will need to be followed up.  The patient's mother is fully aware of the risk the patient poses to her.  She states that she is going to take out a 50-B, attempt to get his name off of the deed to the house, and install a new security system.  She expresses understanding that the patient has a significant  likelihood of attempting to harm her once he is released from the inpatient psychiatric hospital.    Initial HPI: Patient seen and reassessed in intensive care, he seemingly appears to be appropriate, respiratory rate under control nasal cannula in place.  Patient is seen and discussed concerns regarding intentional overdose, in which he adamantly denies intentional suicide attempt.  He states he recalls using the drugs fentanyl and heroin, and drinking  wine however had no suicidal intent.  He states prior to coming to the hospital he denies any suicidal ideations, suicidal thoughts or suicidal behaviors.  He further denies any recent suicide attempts that resulted in inpatient hospitalization or ongoing medical needs.  Patient was unaware that combining both of those illicit substances together would increase his risk for becoming unconscious and resulting in him being admitted to the hospital.  He is agreeable to continue to treat and monitor during medical admission, however denies need for inpatient psychiatric services.  He cites his family to include child and mother who are in need of his help at this time.  On today's reassessment patient did not present with any focal neurological deficit, he was able to speak clearly, follow commands, and denies weakness.   At this current time, patient's pre-existing condition that was substantially aggravated prior to this admission has returned to previous level.  Patient further states that his current health at this present time seems to be normal as he is noted to feel better and like his self again.  He does continue to endorse some mental fog, overall assessing is doing better.  He does not present with any decrease in attention or concentration, he is alert and oriented x 4, shows no short-term memory deficit.  Although he is unable to recall his previous actions that resulted in hospitalization, and or confabulation and his delusions.  He is not lethargic on today's exam, shows linear thought processes, and answers all questions appropriately.  He denies suicidal ideation, homicidal ideation, and or auditory or visual hallucinations.  Will continue to treat and monitor during this hospitalization.   Past Psychiatric History:  Dx: opioid use d/o, benzodiazepine use d/o, alcohol use d/o, substance-induced mood disorder. Reports h/o past suicidal attempts via OD on drugs. H/o past psych admissions; last  2018. Was at [facility] for Tx of addiction. No recent psych medications. Past psych medications: Vraylar, Gabapentin, Trazodone.   Risk to Self:  yes Risk to Others:  no Prior Inpatient Therapy:   Prior Outpatient Therapy:    Past Medical History:  Past Medical History:  Diagnosis Date   Asthma    Gastric ulcer    Hepatitis C    Hypertension    History reviewed. No pertinent surgical history. Family History:  Family History  Problem Relation Age of Onset   Hypertension Father    Family Psychiatric  History: unable to obtain due to patient mental status. Social History:  Social History   Substance and Sexual Activity  Alcohol Use No     Social History   Substance and Sexual Activity  Drug Use Yes   Types: IV   Comment: herion    Social History   Socioeconomic History   Marital status: Single    Spouse name: Not on file   Number of children: 0   Years of education: Not on file   Highest education level: Not on file  Occupational History   Not on file  Tobacco Use   Smoking status: Former    Types: Cigarettes  Smokeless tobacco: Never  Vaping Use   Vaping Use: Never used  Substance and Sexual Activity   Alcohol use: No   Drug use: Yes    Types: IV    Comment: herion   Sexual activity: Not on file  Other Topics Concern   Not on file  Social History Narrative   Not on file   Social Determinants of Health   Financial Resource Strain: Low Risk  (03/23/2022)   Overall Financial Resource Strain (CARDIA)    Difficulty of Paying Living Expenses: Not very hard  Food Insecurity: Not on file  Transportation Needs: No Transportation Needs (03/23/2022)   PRAPARE - Hydrologist (Medical): No    Lack of Transportation (Non-Medical): No  Physical Activity: Not on file  Stress: Not on file  Social Connections: Not on file   Additional Social History:    Allergies:   Allergies  Allergen Reactions   Tylenol [Acetaminophen] Rash     Labs:  Results for orders placed or performed during the hospital encounter of 03/16/22 (from the past 48 hour(s))  D-dimer, quantitative     Status: Abnormal   Collection Time: 03/21/22 10:21 PM  Result Value Ref Range   D-Dimer, Quant 4.66 (H) 0.00 - 0.50 ug/mL-FEU    Comment: (NOTE) At the manufacturer cut-off value of 0.5 g/mL FEU, this assay has a negative predictive value of 95-100%.This assay is intended for use in conjunction with a clinical pretest probability (PTP) assessment model to exclude pulmonary embolism (PE) and deep venous thrombosis (DVT) in outpatients suspected of PE or DVT. Results should be correlated with clinical presentation. Performed at [facility], [place] [address], [place], [place] [address]   Lactic acid, plasma     Status: Abnormal   Collection Time: 03/21/22 10:21 PM  Result Value Ref Range   Lactic Acid, Venous 2.3 (HH) 0.5 - 1.9 mmol/L    Comment: CRITICAL RESULT CALLED TO, READ BACK BY AND VERIFIED WITH L.TZINTZUN,RN. 2327 03/21/22. LPAIT Performed at [facility], [place] [address] [place], [place] [address]   Beta-hydroxybutyric acid     Status: None   Collection Time: 03/21/22 10:21 PM  Result Value Ref Range   Beta-Hydroxybutyric Acid 0.05 0.05 - 0.27 mmol/L    Comment: Performed at [facility] [address], [place], [place] [address]  Blood gas, arterial     Status: Abnormal   Collection Time: 03/21/22 10:35 PM  Result Value Ref Range   pH, Arterial 7.53 (H) 7.35 - 7.45   pCO2 arterial 32 32 - 48 mmHg   pO2, Arterial 77 (L) 83 - 108 mmHg   Bicarbonate 26.7 20.0 - 28.0 mmol/L   Acid-Base Excess 4.4 (H) 0.0 - 2.0 mmol/L   O2 Saturation 97.8 %   Patient temperature 37.1    Collection site LEFT RADIAL    Drawn by [id number]    Allens test (pass/fail) PASS PASS    Comment: Performed at [facility] [address] [place], [place] [address]  Rapid urine drug screen (hospital performed)     Status: Abnormal    Collection Time: 03/22/22 12:53 AM  Result Value Ref Range   Opiates POSITIVE (A) NONE DETECTED   Cocaine NONE DETECTED NONE DETECTED   Benzodiazepines POSITIVE (A) NONE DETECTED   Amphetamines NONE DETECTED NONE DETECTED   Tetrahydrocannabinol POSITIVE (A) NONE DETECTED   Barbiturates POSITIVE (A) NONE DETECTED    Comment: (NOTE) DRUG SCREEN  FOR MEDICAL PURPOSES ONLY.  IF CONFIRMATION IS NEEDED FOR ANY PURPOSE, NOTIFY LAB WITHIN 5 DAYS.  LOWEST DETECTABLE LIMITS FOR URINE DRUG SCREEN Drug Class                     Cutoff (ng/mL) Amphetamine and metabolites    1000 Barbiturate and metabolites    200 Benzodiazepine                 200 Opiates and metabolites        300 Cocaine and metabolites        300 THC                            50 Performed at [facility], [place] [address] [place], [place] [id number]   Basic metabolic panel     Status: Abnormal   Collection Time: 03/22/22 12:57 AM  Result Value Ref Range   Sodium 134 (L) 135 - 145 mmol/L   Potassium 3.4 (L) 3.5 - 5.1 mmol/L   Chloride 101 98 - 111 mmol/L   CO2 24 22 - 32 mmol/L   Glucose, Bld 108 (H) 70 - 99 mg/dL    Comment: Glucose reference range applies only to samples taken after fasting for at least 8 hours.   BUN 16 6 - 20 mg/dL   Creatinine, Ser 1.14 0.61 - 1.24 mg/dL   Calcium 7.4 (L) 8.9 - 10.3 mg/dL   GFR, Estimated >60 >60 mL/min    Comment: (NOTE) Calculated using the CKD-EPI Creatinine Equation (2021)    Anion gap 9 5 - 15    Comment: Performed at [facility] [address], [place], [place] [address]  Brain natriuretic peptide     Status: Abnormal   Collection Time: 03/22/22 12:57 AM  Result Value Ref Range   B Natriuretic Peptide 104.6 (H) 0.0 - 100.0 pg/mL    Comment: Performed at [facility] [address], [place], [place] [address]  Magnesium     Status: None   Collection Time: 03/22/22  9:10 AM  Result Value Ref Range   Magnesium 2.2 1.7 - 2.4 mg/dL    Comment:  Performed at [facility] [address], [place],  [address]  Urinalysis, Routine w reflex microscopic Urine, Clean Catch     Status: None   Collection Time: 03/22/22 12:13 PM  Result Value Ref Range   Color, Urine YELLOW YELLOW   APPearance CLEAR CLEAR   Specific Gravity, Urine 1.020 1.005 - 1.030   pH 6.0 5.0 - 8.0   Glucose, UA NEGATIVE NEGATIVE mg/dL   Hgb urine dipstick NEGATIVE NEGATIVE   Bilirubin Urine NEGATIVE NEGATIVE   Ketones, ur NEGATIVE NEGATIVE mg/dL   Protein, ur NEGATIVE NEGATIVE mg/dL   Nitrite NEGATIVE NEGATIVE   Leukocytes,Ua NEGATIVE NEGATIVE    Comment: Performed at [facility] [address] [place] [address]  CBC     Status: Abnormal   Collection Time: 03/23/22  1:04 AM  Result Value Ref Range   WBC 13.1 (H) 4.0 - 10.5 K/uL   RBC 4.93 4.22 - 5.81 MIL/uL   Hemoglobin 15.3 13.0 - 17.0 g/dL   HCT 43.4 39.0 - 52.0 %   MCV 88.0 80.0 - 100.0 fL   MCH 31.0 26.0 - 34.0 pg   MCHC 35.3 30.0 - 36.0 g/dL   RDW 12.5 11.5 - 15.5 %  Platelets 300 150 - 400 K/uL   nRBC 0.0 0.0 - 0.2 %    Comment: Performed at [facility], [place] [address], [place], [place] [id number]  Basic metabolic panel     Status: Abnormal   Collection Time: 03/23/22  1:04 AM  Result Value Ref Range   Sodium 136 135 - 145 mmol/L   Potassium 3.9 3.5 - 5.1 mmol/L   Chloride 101 98 - 111 mmol/L   CO2 24 22 - 32 mmol/L   Glucose, Bld 105 (H) 70 - 99 mg/dL    Comment: Glucose reference range applies only to samples taken after fasting for at least 8 hours.   BUN 10 6 - 20 mg/dL   Creatinine, Ser 0.68 0.61 - 1.24 mg/dL   Calcium 8.1 (L) 8.9 - 10.3 mg/dL   GFR, Estimated >60 >60 mL/min    Comment: (NOTE) Calculated using the CKD-EPI Creatinine Equation (2021)    Anion gap 11 5 - 15    Comment: Performed at [facility] [address], [place], [place] [address]  Magnesium     Status: None   Collection Time: 03/23/22  1:04 AM  Result Value Ref Range    Magnesium 1.9 1.7 - 2.4 mg/dL    Comment: Performed at [facility] [address], [place],  [address]    Current Facility-Administered Medications  Medication Dose Route Frequency Provider Last Rate Last Admin   0.9 %  sodium chloride infusion  250 mL Intravenous Continuous Erick Colace, NP 10 mL/hr at 03/23/22 0823 250 mL at 03/23/22 [id number]   cariprazine (VRAYLAR) capsule 3 mg  3 mg Oral Daily Gleason, Otilio Carpen, PA-C   3 mg at 03/23/22 [id number]   Chlorhexidine Gluconate Cloth 2 % PADS 6 each  6 each Topical Daily Richarda Osmond, MD   6 each at 03/23/22 1252   dextromethorphan-guaiFENesin ([place] DM) 30-600 MG per 12 hr tablet 1 tablet  1 tablet Oral BID PRN Richarda Osmond, MD   1 tablet at 03/22/22 1603   diphenhydrAMINE (BENADRYL) capsule 50 mg  50 mg Oral Q6H PRN Corky Sox, MD       Or   diphenhydrAMINE (BENADRYL) injection 50 mg  50 mg Intravenous Q6H PRN Corky Sox, MD       enoxaparin (LOVENOX) injection 40 mg  40 mg Subcutaneous Q24H Olalere, Adewale A, MD   40 mg at [id number]   folic acid injection 1 mg  1 mg Intravenous Daily Simonne Maffucci B, MD   1 mg at 03/23/22 1614   gabapentin (NEURONTIN) capsule 300 mg  300 mg Oral BID Suella Broad, FNP   300 mg at 03/23/22 0817   haloperidol (HALDOL) tablet 5 mg  5 mg Oral Q6H PRN Corky Sox, MD       Or   haloperidol lactate (HALDOL) injection 5 mg  5 mg Intravenous Q6H PRN Corky Sox, MD       ibuprofen (ADVIL) tablet 400 mg  400 mg Oral Q6H PRN Gleason, Otilio Carpen, PA-C   400 mg at 03/22/22 1000   lidocaine (LIDODERM) 5 % 1 patch  1 patch Transdermal Q24H Gleason, Otilio Carpen, PA-C   1 patch at 03/23/22 0816   LORazepam (ATIVAN) injection 2 mg  2 mg Intravenous Q4H PRN Richarda Osmond, MD   2 mg at 03/23/22 1610   menthol-cetylpyridinium (CEPACOL) lozenge 3 mg  1 lozenge Oral PRN Richarda Osmond, MD  3 mg at 03/23/22 1614   ondansetron (ZOFRAN) injection 4 mg  4 mg Intravenous  Q6H PRN Simonne Martinet, NP   4 mg at 03/18/22 2330   Oral care mouth rinse  15 mL Mouth Rinse 4 times per day Leeroy Bock, MD   15 mL at 03/22/22 1127   Oral care mouth rinse  15 mL Mouth Rinse PRN Leeroy Bock, MD       polyethylene glycol (MIRALAX / GLYCOLAX) packet 17 g  17 g Oral Daily PRN Simonne Martinet, NP       polyethylene glycol (MIRALAX / GLYCOLAX) packet 17 g  17 g Oral Daily Gleason, Darcella Gasman, PA-C   17 g at 03/22/22 1121   senna-docusate (Senokot-S) tablet 1 tablet  1 tablet Oral Q24H Gleason, Darcella Gasman, PA-C   1 tablet at 03/22/22 1121   thiamine (VITAMIN B1) 500 mg in normal saline (50 mL) IVPB  500 mg Intravenous TID Gleason, Darcella Gasman, PA-C 100 mL/hr at 03/23/22 1616 500 mg at 03/23/22 1616   Followed by   Melene Muller ON 03/25/2022] thiamine (VITAMIN B1) injection 100 mg  100 mg Intravenous Q24H Gleason, Darcella Gasman, PA-C        Musculoskeletal: Strength & Muscle Tone: decreased Gait & Station:  N/A (lying in the bed Patient leans: N/A            Psychiatric Specialty Exam:  Presentation  General Appearance: Appropriate for Environment; Casual  Eye Contact:Fair  Speech:Clear and Coherent; Normal Rate  Speech Volume:Normal  Handedness:No data recorded  Mood and Affect  Mood: "angry" Affect: congruent, irritable, labile  Thought Process  Thought Processes:Coherent; Linear  Descriptions of Associations:Intact Orientation:Full (Time, Place and Person)  Thought Content: logical  Ideas of Reference:None  Suicidal Thoughts:Suicidal Thoughts: No  Homicidal Thoughts:Homicidal Thoughts: Yes, Active HI Active Intent and/or Plan: With Intent; Without Plan; Without Means to Carry Out; Without Access to Means   Sensorium  Memory:Immediate Fair; Recent Fair; Remote Fair  Judgment: poor Insight:Poor   Executive Functions  Concentration:Fair  Attention Span:Fair  Recall:Fair  Fund of Knowledge:Fair  Language:Fair   Psychomotor  Activity  Psychomotor Activity:Psychomotor Activity: Normal Normal tone, no rigidity, no resting/postural tremors, no tardive dyskinesia    Assets  Assets:Communication Skills; Desire for Improvement; Financial Resources/Insurance; Resilience; Social Support  Sleep  Sleep:Sleep: Fair   Physical Exam Constitutional:      Appearance: the patient is not toxic-appearing.  Pulmonary:     Effort: Pulmonary effort is normal.  Neurological:     General: No focal deficit present.     Mental Status: the patient is alert and oriented to person, place, and time.   Review of Systems  Respiratory:  Negative for shortness of breath.   Cardiovascular:  Negative for chest pain.  Gastrointestinal:  Negative for abdominal pain, constipation, diarrhea, nausea and vomiting.  Neurological:  Negative for headaches.   Blood pressure (!) 126/91, pulse 70, temperature 98.1 F (36.7 C), temperature source Oral, resp. rate 20, height 5\' 9"  (1.753 m), weight 77.2 kg, SpO2 95 %. Body mass index is 25.13 kg/m.  Recommendation One-to-one monitoring, inpatient psychiatric admission, agitation medications as ordered  Thank you for your consult. We will continue to follow this patient.    Carlyn Reichert, MD 03/23/2022 6:49 PM  Edited to document homicidal ideation and mental status exam portion. Mariel Craft, MD 03/23/2022 7:28 PM  Attestation signed by Mariel Craft, MD at 03/23/2022  7:25 PM  I have reviewed the note by Dr. Alvie Heidelberg, and discussed the case.  I am in agreement with the assessment and plan.  I have independently interviewed and assessed the patient. In summary, Jared [name] is a 41 y.o. male with a history of polysubstance abuse and substance-induced mood disorder, who is admitted with intentional overdose on heroin, xanax, alcohol.  Patient was incarcerated until 12/2021 after which time he moved in with his mother.  Patient describes his mother as being overbearing and  states "I cannot deal with her."  Patient is minimizing consequences of his substance use.  He does show some remorse with relapse on heroin, noting that his last heroin use previously had been in 2014.  Patient does admit to a suicidal overdose attempt in 2018 following the break-up with his wife.  In regards to his IVC, he clearly is blaming his mother, and makes suggestive homicidal threats that he will "kick her out of the house and take care of her".  Patient asks if he will be criminally charged if he attempts to elope from the hospital while under involuntary commitment.  He states that he believes he can out from the police.  Agree with plan for agitation protocol.  Nursing staff and security have been notified of patient's threats to leave the hospital while under involuntary commitment.  Security discussed with the patient need to call the police should he attempt to elope and patient verbalized understanding.  Agitation protocol is in place to maintain safety of staff and patient.  Psychiatry will continue to follow.  Continue to recommend inpatient psychiatric hospitalization once patient is medically cleared.  Patient states that he needs to live to care for his 12 year old son whom he has had 2 visits with since being released from prison, but also states he would have rather died from his overdose than have to deal with his mother.   Lavella Hammock, MD

## 2022-03-23 NOTE — Progress Notes (Signed)
Heart Failure Nurse Navigator Progress Note  PCP: Patient, No Pcp Per PCP-Cardiologist: None Admission Diagnosis: Opiate overdose, Acute respiratory failure with hypoxia Admitted from: Home via EMS  Presentation:   Elissa Lovett presented with being found down by his mother at home, with intentional drug overdose, reported patient used heroin, alcohol and xanax. ECHO 12/22 30-35%, CXR showed possible edema vs infection, CTA negative for PE, with cardiomegaly and pulmonary edema. Was admitted to the ICU  for respiratory distress. IV lasix, and antibiotics started for pneumonia/ septic shock. BP 71/55, HR 102,  NRB 15L,  Patient was given [facility] with Heart failure book, with education on sign and symptoms of heart failure, daily weights, diet/ fluid restrictions, taking all medications as prescribed and attending all medical appointments. A hospital follow up appointment for HF TOC was scheduled for 04/09/2022.   ECHO/ LVEF: 30-35% new  Clinical Course:  Past Medical History:  Diagnosis Date   Asthma    Gastric ulcer    Hepatitis C    Hypertension      Social History   Socioeconomic History   Marital status: Single    Spouse name: Not on file   Number of children: 0   Years of education: Not on file   Highest education level: Not on file  Occupational History   Not on file  Tobacco Use   Smoking status: Former    Types: Cigarettes   Smokeless tobacco: Never  Vaping Use   Vaping Use: Never used  Substance and Sexual Activity   Alcohol use: No   Drug use: Yes    Types: IV    Comment: herion   Sexual activity: Not on file  Other Topics Concern   Not on file  Social History Narrative   Not on file   Social Determinants of Health   Financial Resource Strain: Low Risk  (03/23/2022)   Overall Financial Resource Strain (CARDIA)    Difficulty of Paying Living Expenses: Not very hard  Food Insecurity: Not on file  Transportation Needs: No Transportation Needs  (03/23/2022)   PRAPARE - Administrator, Civil Service (Medical): No    Lack of Transportation (Non-Medical): No  Physical Activity: Not on file  Stress: Not on file  Social Connections: Not on file    High Risk Criteria for Readmission and/or Poor Patient Outcomes: Heart failure hospital admissions (last 6 months): 0  No Show rate: 0 Difficult social situation: no Demonstrates medication adherence: NO Primary Language: English Literacy level: Reading, writing  Barriers of Care:   New HF education Substance abuse cessation Diet/ fluid/ daily weights  Considerations/Referrals:   Referral made to Heart Failure Pharmacist Stewardship: yes Referral made to Heart Failure CSW/NCM TOC: Substance cessation Referral made to Heart & Vascular TOC clinic: Yes, 04/09/22  Items for Follow-up on DC/TOC: New HF education continue Substance Cessation Diet/ fluid/ daily weights   Rhae Hammock, BSN, RN Heart Failure Teacher, adult education Only

## 2022-03-23 NOTE — Progress Notes (Signed)
Refused cpap.

## 2022-03-23 NOTE — Progress Notes (Addendum)
PROGRESS NOTE        PATIENT DETAILS Name: Jared [name] Age: 41 y.o. Sex: male Date of Birth: 1980-09-13 Admit Date: 03/16/2022 Admitting Physician Tomma Lightning, MD WJX:[id number], No Pcp Per  Brief Summary: Patient is a 41 y.o.  male with history of polysubstance abuse-who presented to the hospital on 12/22 with septic shock-secondary to aspiration pneumonia in the setting of narcotic overdose.  He was subsequently admitted to the ICU-and upon further evaluation found to have new onset of acute systolic heart failure, he was briefly transferred to [facility] service on 12/24 but developed worsening respiratory failure on 12/25-he was subsequently found to be RSV positive and transferred back to the ICU on BiPAP.  He was stabilized and transferred back to [facility] on 12/29.  See below for further details.    Significant events: 12/22>> admit to ICU-septic shock/aspiration pneumonia/hypoxia 12/23>>Psych consult> suicide precautions, don't let leave AMA, d/c to inpatient psyche facility; started phenobarbital  12/24>> transferred to [facility]  12/25>> Increased WOB, Tm 103.69F-RSV positive-BiPAP-transfer back to ICU 12/27>> Remains on precedex, 10L HFNC 12/28>> off precedex 12/29>> transferred to [facility]  Significant studies: 12/22>> CXR: Diffuse alveolar densities right> left 12/22>> echo: EF 30-35% 12/28>> CTA chest: No PE, diffuse lung disease with patchy dense opacities right> left, mildly enlarged hilar/mediastinal lymph nodes.  Significant microbiology data: 12/22>> blood cultures: Negative 12/25>> RSV PCR: Positive 12/25>> COVID/influenza PCR: Negative 12/25>> blood culture: Negative  Procedures: None  Consults: PCCM  Subjective: Sitting at bedside chair-was on 2 L of oxygen-subsequently titrated to room air today.  Calm/quiet-and anxious to leave the hospital-requesting discharge.  Objective: Vitals: Blood pressure (!) 125/93, pulse 76, temperature 98.6 F  (37 C), temperature source Oral, resp. rate 19, height 5\' 9"  (1.753 m), weight 77.2 kg, SpO2 94 %.   Exam: Gen Exam:Alert awake-not in any distress HEENT:atraumatic, normocephalic Chest: B/L clear to auscultation anteriorly CVS:S1S2 regular Abdomen:soft non tender, non distended Extremities:no edema Neurology: Non focal Skin: no rash  Pertinent Labs/Radiology:    Latest Ref Rng & Units 03/23/2022    1:04 AM 03/21/2022   10:45 AM 03/19/2022   10:08 PM  CBC  WBC 4.0 - 10.5 K/uL 13.1  11.9  26.8   Hemoglobin 13.0 - 17.0 g/dL 03/21/2022  56.2  13.0   Hematocrit 39.0 - 52.0 % 43.4  41.0  38.7   Platelets 150 - 400 K/uL 300  225  200     Lab Results  Component Value Date   NA 136 03/23/2022   K 3.9 03/23/2022   CL 101 03/23/2022   CO2 24 03/23/2022      Assessment/Plan: Acute hypoxic respiratory failure secondary to aspiration and RSV pneumonia Septic shock Overall improved-was on BiPAP/HFNC while in the ICU-now on room air. Sepsis physiology has resolved Has completed 5 days of Zosyn for aspiration Cultures negative so far Continue bronchodilators and other supportive measures. Will need to repeat two-view CXR/CT chest in 4 to 6 weeks to document resolution of infiltrates  Acute metabolic encephalopathy due to hypoxia/EtOH withdrawal Substance abuse induced mood disorder/agitation Significantly better-calm/quiet this morning Completed a phenobarb taper while in the ICU, briefly required Precedex Psych following  Addendum 2/25 PM Spoke with psych resident Dr. 3/25 are getting conflicting collaborative information from mother.  Recommendations are to pursue inpatient psychiatry admission.  Patient is currently medically optimized to  be transferred to inpatient psychiatry when a bed is available.  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 Await further recommendations from psychiatry  Polysubstance abuse (alcohol/opioids/benzos) Counseled extensively this morning He is now out of the window for any sort of alcohol/drug withdrawal symptoms.  Substance-induced mood disorder On Vraylar and gabapentin per psychiatry  BMI: Estimated body mass index is 25.13 kg/m as calculated from the following:   Height as of this encounter: 5\' 9"  (1.753 m).   Weight as of this encounter: 77.2 kg.   Code status:   Code Status: Full Code   DVT Prophylaxis: enoxaparin (LOVENOX) injection 40 mg Start: 03/17/22 1145   Family Communication: None at bedside   Disposition Plan: Status is: Inpatient Remains inpatient appropriate because: Severity of illness.   Planned Discharge Destination:Home   Diet: Diet Order             Diet regular Room service appropriate? Yes; Fluid consistency: Thin  Diet effective now                     Antimicrobial agents: Anti-infectives (From admission, onward)    Start     Dose/Rate Route Frequency Ordered Stop   03/19/22 2200  piperacillin-tazobactam (ZOSYN) IVPB 3.375 g  Status:  Discontinued        3.375 g 12.5 mL/hr over 240 Minutes Intravenous Every 8 hours 03/19/22 2059 03/22/22 0945   03/19/22 2145  vancomycin (VANCOREADY) IVPB 1500 mg/300 mL  Status:  Discontinued        1,500 mg 150 mL/hr over 120 Minutes Intravenous Every 12 hours 03/19/22 2059 03/20/22 1139   03/19/22 1200  amoxicillin-clavulanate (AUGMENTIN) 875-125 MG per tablet 1 tablet  Status:  Discontinued        1 tablet Oral Every 12 hours 03/19/22 0849 03/19/22 2041    03/17/22 0300  vancomycin (VANCOREADY) IVPB 1250 mg/250 mL  Status:  Discontinued        1,250 mg 166.7 mL/hr over 90 Minutes Intravenous Every 12 hours 03/16/22 1452 03/18/22 1353   03/17/22 0000  Ampicillin-Sulbactam (UNASYN) 3 g in sodium chloride 0.9 % 100 mL IVPB  Status:  Discontinued        3 g 200 mL/hr over 30 Minutes Intravenous Every 6 hours 03/16/22 1601 03/19/22 0849   03/16/22 1500  vancomycin (VANCOREADY) IVPB 1750 mg/350 mL        1,750 mg 175 mL/hr over 120 Minutes Intravenous  Once 03/16/22 1452 03/16/22 1830   03/16/22 1330  cefTRIAXone (ROCEPHIN) 2 g in sodium chloride 0.9 % 100 mL IVPB        2 g 200 mL/hr over 30 Minutes Intravenous  Once 03/16/22 1319 03/16/22 1413   03/16/22 1330  azithromycin (ZITHROMAX) 500 mg in sodium chloride 0.9 % 250 mL IVPB  Status:  Discontinued        500 mg 250 mL/hr over 60 Minutes Intravenous Every 24 hours 03/16/22 1319 03/16/22 1526        MEDICATIONS: Scheduled Meds:  albuterol  2.5 mg Nebulization Once   cariprazine  3 mg Oral Daily  Chlorhexidine Gluconate Cloth  6 each Topical Daily   enoxaparin (LOVENOX) injection  40 mg Subcutaneous Q24H   folic acid  1 mg Intravenous Daily   gabapentin  300 mg Oral BID   lidocaine  1 patch Transdermal Q24H   mouth rinse  15 mL Mouth Rinse 4 times per day   polyethylene glycol  17 g Oral Daily   senna-docusate  1 tablet Oral Q24H   [START ON 03/25/2022] thiamine (VITAMIN B1) injection  100 mg Intravenous Q24H   Continuous Infusions:  sodium chloride 250 mL (03/23/22 0823)   thiamine (VITAMIN B1) injection 500 mg (03/23/22 0826)   PRN Meds:.dextromethorphan-guaiFENesin, ibuprofen, LORazepam, menthol-cetylpyridinium, ondansetron (ZOFRAN) IV, mouth rinse, polyethylene glycol   I have personally reviewed following labs and imaging studies  LABORATORY DATA: CBC: Recent Labs  Lab 03/16/22 1155 03/16/22 1218 03/18/22 0346 03/19/22 0344 03/19/22 2208 03/21/22 1045  03/23/22 0104  WBC 11.2*   < > 26.2* 25.8* 26.8* 11.9* 13.1*  NEUTROABS 9.8*  --   --   --   --   --   --   HGB 17.5*   < > 13.1 14.0 13.9 14.3 15.3  HCT 52.1*   < > 39.3 39.7 38.7* 41.0 43.4  MCV 94.4   < > 94.0 90.8 89.4 89.5 88.0  PLT 261   < > 195 199 200 225 300   < > = values in this interval not displayed.    Basic Metabolic Panel: Recent Labs  Lab 03/17/22 0556 03/18/22 0346 03/19/22 0344 03/19/22 2335 03/21/22 1045 03/22/22 0057 03/22/22 0910 03/23/22 0104  NA 136 134* 135 135 135 134*  --  136  K 4.0 3.6 3.4* 3.3* 3.8 3.4*  --  3.9  CL 101 103 105 105 105 101  --  101  CO2 23 23 22  21* 24 24  --  24  GLUCOSE 113* 102* 133* 119* 121* 108*  --  105*  BUN 15 8 6 9 14 16   --  10  CREATININE 0.71 0.72 0.82 0.68 0.78 1.14  --  0.68  CALCIUM 8.2* 8.2* 8.0* 7.8* 7.8* 7.4*  --  8.1*  MG 1.5* 1.6*  --  1.8 2.1  --  2.2 1.9  PHOS 2.4* 1.3*  --   --   --   --   --   --     GFR: Estimated Creatinine Clearance: 124 mL/min (by C-G formula based on SCr of 0.68 mg/dL).  Liver Function Tests: Recent Labs  Lab 03/16/22 1155  AST 37  ALT 32  ALKPHOS 71  BILITOT 0.8  PROT 6.3*  ALBUMIN 3.3*   No results for input(s): "LIPASE", "AMYLASE" in the last 168 hours. No results for input(s): "AMMONIA" in the last 168 hours.  Coagulation Profile: Recent Labs  Lab 03/16/22 1155  INR 1.0    Cardiac Enzymes: Recent Labs  Lab 03/16/22 1155  CKTOTAL 266    BNP (last 3 results) No results for input(s): "PROBNP" in the last 8760 hours.  Lipid Profile: No results for input(s): "CHOL", "HDL", "LDLCALC", "TRIG", "CHOLHDL", "LDLDIRECT" in the last 72 hours.  Thyroid Function Tests: No results for input(s): "TSH", "T4TOTAL", "FREET4", "T3FREE", "THYROIDAB" in the last 72 hours.  Anemia Panel: No results for input(s): "VITAMINB12", "FOLATE", "FERRITIN", "TIBC", "IRON", "RETICCTPCT" in the last 72 hours.  Urine analysis:    Component Value Date/Time   COLORURINE YELLOW  03/22/2022 1213   APPEARANCEUR CLEAR 03/22/2022 1213   LABSPEC 1.020 03/22/2022 1213  PHURINE 6.0 03/22/2022 1213   GLUCOSEU NEGATIVE 03/22/2022 1213   HGBUR NEGATIVE 03/22/2022 1213   BILIRUBINUR NEGATIVE 03/22/2022 1213   KETONESUR NEGATIVE 03/22/2022 1213   PROTEINUR NEGATIVE 03/22/2022 1213   UROBILINOGEN 0.2 12/18/2013 1007   NITRITE NEGATIVE 03/22/2022 1213   LEUKOCYTESUR NEGATIVE 03/22/2022 1213    Sepsis Labs: Lactic Acid, Venous    Component Value Date/Time   LATICACIDVEN 2.3 (HH) 03/21/2022 2221    MICROBIOLOGY: Recent Results (from the past 240 hour(s))  Blood culture (routine x 2)     Status: None   Collection Time: 03/16/22 12:02 PM   Specimen: BLOOD LEFT FOREARM  Result Value Ref Range Status   Specimen Description BLOOD LEFT FOREARM  Final   Special Requests   Final    BOTTLES DRAWN AEROBIC AND ANAEROBIC Blood Culture results may not be optimal due to an excessive volume of blood received in culture bottles   Culture   Final    NO GROWTH 5 DAYS Performed at [facility], [address] [place], [place] [address]    Report Status 03/21/2022 FINAL  Final  Blood culture (routine x 2)     Status: None   Collection Time: 03/16/22 12:07 PM   Specimen: BLOOD LEFT FOREARM  Result Value Ref Range Status   Specimen Description BLOOD LEFT FOREARM  Final   Special Requests   Final    BOTTLES DRAWN AEROBIC ONLY Blood Culture results may not be optimal due to an inadequate volume of blood received in culture bottles   Culture   Final    NO GROWTH 5 DAYS Performed at [facility], [address] [place], [place] [address]    Report Status 03/21/2022 FINAL  Final  MRSA Next Gen by PCR, Nasal     Status: Abnormal   Collection Time: 03/16/22  7:25 PM   Specimen: Nasal Mucosa; Nasal Swab  Result Value Ref Range Status   MRSA by PCR Next Gen DETECTED (A) NOT DETECTED Final    Comment: RESULT CALLED TO, READ BACK BY AND VERIFIED WITH:  C/ S. SAVAGE, RN  03/16/22 2252 A. LAFRANCE (NOTE) The GeneXpert MRSA Assay (FDA approved for NASAL specimens only), is one component of a comprehensive MRSA colonization surveillance program. It is not intended to diagnose MRSA infection nor to guide or monitor treatment for MRSA infections. Test performance is not FDA approved in patients less than 4 years old. Performed at [facility], [address] [place], [place] [address]   Resp panel by RT-PCR (RSV, Flu A&B, Covid) Anterior Nasal Swab     Status: Abnormal   Collection Time: 03/19/22  8:40 PM   Specimen: Anterior Nasal Swab  Result Value Ref Range Status   SARS Coronavirus 2 by RT PCR NEGATIVE NEGATIVE Final    Comment: (NOTE) SARS-CoV-2 target nucleic acids are NOT DETECTED.  The SARS-CoV-2 RNA is generally detectable in upper respiratory specimens during the acute phase of infection. The lowest concentration of SARS-CoV-2 viral copies this assay can detect is 138 copies/mL. A negative result does not preclude SARS-Cov-2 infection and should not be used as the sole basis for treatment or other patient management decisions. A negative result may occur with  improper specimen collection/handling, submission of specimen other than nasopharyngeal swab, presence of viral mutation(s) within the areas targeted by this assay, and inadequate number of viral copies(<138 copies/mL). A negative result must be combined with clinical observations, patient history, and epidemiological information. The expected  result is Negative.  Fact Sheet for Patients:  BloggerCourse.com  Fact Sheet for Healthcare Providers:  SeriousBroker.it  This test is no t yet approved or cleared by the [place] FDA and  has been authorized for detection and/or diagnosis of SARS-CoV-2 by FDA under an Emergency Use Authorization (EUA). This EUA will remain  in effect (meaning this test can be used) for the duration  of the COVID-19 declaration under Section 564(b)(1) of the Act, 21 U.S.C.section 360bbb-3(b)(1), unless the authorization is terminated  or revoked sooner.       Influenza A by PCR NEGATIVE NEGATIVE Final   Influenza B by PCR NEGATIVE NEGATIVE Final    Comment: (NOTE) The Xpert Xpress SARS-CoV-2/FLU/RSV plus assay is intended as an aid in the diagnosis of influenza from Nasopharyngeal swab specimens and should not be used as a sole basis for treatment. Nasal washings and aspirates are unacceptable for Xpert Xpress SARS-CoV-2/FLU/RSV testing.  Fact Sheet for Patients: BloggerCourse.com  Fact Sheet for Healthcare Providers: SeriousBroker.it  This test is not yet approved or cleared by the [place] FDA and has been authorized for detection and/or diagnosis of SARS-CoV-2 by FDA under an Emergency Use Authorization (EUA). This EUA will remain in effect (meaning this test can be used) for the duration of the COVID-19 declaration under Section 564(b)(1) of the Act, 21 U.S.C. section 360bbb-3(b)(1), unless the authorization is terminated or revoked.     Resp Syncytial Virus by PCR POSITIVE (A) NEGATIVE Final    Comment: (NOTE) Fact Sheet for Patients: BloggerCourse.com  Fact Sheet for Healthcare Providers: SeriousBroker.it  This test is not yet approved or cleared by the [place] FDA and has been authorized for detection and/or diagnosis of SARS-CoV-2 by FDA under an Emergency Use Authorization (EUA). This EUA will remain in effect (meaning this test can be used) for the duration of the COVID-19 declaration under Section 564(b)(1) of the Act, 21 U.S.C. section 360bbb-3(b)(1), unless the authorization is terminated or revoked.  Performed at [name], [address] [place], [place] [address]   Respiratory (~20 pathogens) panel by PCR     Status: Abnormal    Collection Time: 03/19/22  8:40 PM   Specimen: Nasopharyngeal Swab; Respiratory  Result Value Ref Range Status   Adenovirus NOT DETECTED NOT DETECTED Final   Coronavirus 229E NOT DETECTED NOT DETECTED Final    Comment: (NOTE) The Coronavirus on the Respiratory Panel, DOES NOT test for the novel  Coronavirus (2019 nCoV)    Coronavirus HKU1 NOT DETECTED NOT DETECTED Final   Coronavirus NL63 NOT DETECTED NOT DETECTED Final   Coronavirus OC43 NOT DETECTED NOT DETECTED Final   Metapneumovirus NOT DETECTED NOT DETECTED Final   Rhinovirus / Enterovirus NOT DETECTED NOT DETECTED Final   Influenza A NOT DETECTED NOT DETECTED Final   Influenza B NOT DETECTED NOT DETECTED Final   Parainfluenza Virus 1 NOT DETECTED NOT DETECTED Final   Parainfluenza Virus 2 NOT DETECTED NOT DETECTED Final   Parainfluenza Virus 3 NOT DETECTED NOT DETECTED Final   Parainfluenza Virus 4 NOT DETECTED NOT DETECTED Final   Respiratory Syncytial Virus DETECTED (A) NOT DETECTED Final   Bordetella pertussis NOT DETECTED NOT DETECTED Final   Bordetella Parapertussis NOT DETECTED NOT DETECTED Final   Chlamydophila pneumoniae NOT DETECTED NOT DETECTED Final   Mycoplasma pneumoniae NOT DETECTED NOT DETECTED Final    Comment: Performed at [facility], [address], [place], [place] [address]  Culture, blood (Routine X 2) w  Reflex to ID Panel     Status: None (Preliminary result)   Collection Time: 03/19/22 10:11 PM   Specimen: BLOOD  Result Value Ref Range Status   Specimen Description BLOOD BLOOD LEFT HAND  Final   Special Requests   Final    BOTTLES DRAWN AEROBIC AND ANAEROBIC Blood Culture adequate volume   Culture   Final    NO GROWTH 4 DAYS Performed at [facility], [address] [place], [place] [address]    Report Status PENDING  Incomplete  Culture, blood (Routine X 2) w Reflex to ID Panel     Status: None (Preliminary result)   Collection Time: 03/19/22 10:11 PM   Specimen: BLOOD  Result  Value Ref Range Status   Specimen Description BLOOD BLOOD LEFT HAND  Final   Special Requests   Final    BOTTLES DRAWN AEROBIC AND ANAEROBIC Blood Culture adequate volume   Culture   Final    NO GROWTH 4 DAYS Performed at [facility], [address], [place], [place] [address]    Report Status PENDING  Incomplete    RADIOLOGY STUDIES/RESULTS: CT Angio Chest Pulmonary Embolism (PE) W or WO Contrast  Result Date: 03/22/2022 CLINICAL DATA:  Pulmonary embolism suspected, with positive D-dimer. EXAM: CT ANGIOGRAPHY CHEST WITH CONTRAST TECHNIQUE: Multidetector CT imaging of the chest was performed using the standard protocol during bolus administration of intravenous contrast. Multiplanar CT image reconstructions and MIPs were obtained to evaluate the vascular anatomy. RADIATION DOSE REDUCTION: This exam was performed according to the departmental dose-optimization program which includes automated exposure control, adjustment of the mA and/or kV according to patient size and/or use of iterative reconstruction technique. CONTRAST:  75mL OMNIPAQUE IOHEXOL 350 MG/ML SOLN COMPARISON:  Recent chest films, from yesterday and December 22-25. Chest CT with IV contrast 12/11/2013 FINDINGS: Cardiovascular: The heart is slightly enlarged. There are no visible coronary artery calcifications. Small pericardial effusion new from the prior study. The aorta and great vessels are normal. The pulmonary veins are decompressed. There is a prominent pulmonary trunk 3.4 cm indicating arterial hypertension but no visible arterial embolus. Mediastinum/Nodes: Small hiatal hernia. There is moderate thickening of the distal thoracic esophagus consistent with esophagitis. There are mildly enlarged bilateral hilar lymph nodes, largest 1.4 cm in short axis. There are similarly prominent subcarinal, precarinal and right paratracheal space lymph nodes in the mediastinum. There are no enlarged axillary lymph nodes. No thyroid mass.  There are mild retained secretions in the trachea versus aspirate. Both main bronchi are patent. Lungs/Pleura: There are symmetric small layering pleural effusions. There is no pneumothorax. There are patchy dense opacities throughout the lungs, denser and more confluent on the right than left, with underlying interstitial thickening. There is relative but not complete sparing of the peripheral lungs, relative sparing in the left-greater-than-right bases. There are small scattered nodular alveolar shadows in the posterior bases. Diffuse bronchial thickening is also noted. There may be scattered small bronchiolar impactions in the basal lower lobes but this is difficult to confirm with the degree of respiratory motion. Upper Abdomen: No acute abnormality. Musculoskeletal: Slight thoracic kyphodextroscoliosis. No acute or significant osseous findings. There is body wall anasarca but no chest wall mass is seen. Review of the MIP images confirms the above findings. IMPRESSION: 1. No evidence of arterial embolus. 2. Prominent pulmonary trunk 3.4 cm indicating arterial hypertension. 3. Cardiomegaly with small pericardial effusion, small pleural effusions, and body wall anasarca. 4. Diffuse lung disease with patchy dense  opacities denser and more confluent on the right than left, with underlying interstitial thickening and small scattered nodular alveolar shadows in the posterior bases. Findings could be due to pulmonary edema, atypical infection, or combination. 5. Diffuse bronchial thickening with possible scattered small bronchiolar impactions in the basal lower lobes but this is difficult to confirm with the degree of respiratory motion. 6. Small hiatal hernia with moderate thickening of the distal thoracic esophagus consistent with esophagitis. 7. Mildly enlarged hilar and mediastinal lymph nodes, possibly reactive but nonspecific. 8. Retained secretions versus aspirate in the trachea. Electronically Signed   By:  Almira Bar M.D.   On: 03/22/2022 02:15   DG Chest Port 1 View  Result Date: 03/21/2022 CLINICAL DATA:  10027.  Tachypnea.  Drug overdose. EXAM: PORTABLE CHEST 1 VIEW COMPARISON:  Portable chest 03/19/2022 FINDINGS: The cardiac size is normal.  Central vascular prominence is similar. Widespread right-greater-than-left airspace disease is still seen with relative left apical and basal sparing, but is mildly improved and less confluent and in general less dense than previously. Underlying interstitial prominence appears similar. There are trace pleural effusions, also unchanged. The mediastinum is normally outlined. Thoracic cage is intact. IMPRESSION: 1. Widespread right-greater-than-left airspace disease is mildly improved. Underlying interstitial prominence and central vascular prominence appears similar. No new findings. 2. Trace pleural effusions. Electronically Signed   By: Almira Bar M.D.   On: 03/21/2022 21:57     LOS: 7 days   Jeoffrey Massed, MD  [facility]    To contact the attending provider between 7A-7P or the covering provider during after hours 7P-7A, please log into the web site [email or web address] and access using universal [place] password for that web site. If you do not have the password, please call the hospital operator.  03/23/2022, 9:35 AM

## 2022-03-23 NOTE — Progress Notes (Signed)
   Heart Failure Stewardship Pharmacist Progress Note   PCP: Patient, No Pcp Per PCP-Cardiologist: None    HPI:  41 yo M with PMH of asthma, hepatitis C, HTN, gastric ulcer, polysubstance use, and substance-induced mood disorder.  Presented to the ED on 12/22 after being found down, intentional drug overdose. Reportedly used heroin, xanax, and alcohol. CXR with possible edema vs infection. ECHO 12/22 with LVEF 30-35%, global hypokinesis, RV mildly reduced. CTA 12/28 with cardiomegaly, pulmonary edema, and negative for PE. Back to ICU for respiratory distress due to pneumonia/septic shock. Completed IV antibiotics on 12/28.  Current HF Medications: None  Prior to admission HF Medications: None  Pertinent Lab Values: Serum creatinine 0.68, BUN 10, Potassium 3.9, Sodium 136, BNP 104.6, Magnesium 1.9   Vital Signs: Weight: 170 lbs (admission weight: 185 lbs) Blood pressure: 130/90s  Heart rate: 60-70s  I/O: -1.5L yesterday; net -5.8L  Medication Assistance / Insurance Benefits Check: Does the patient have prescription insurance?  Yes Type of insurance plan: Financial risk analyst   Outpatient Pharmacy:  Prior to admission [facility] Is the patient willing to use [facility] TOC pharmacy at discharge? Yes Is the patient willing to transition their outpatient pharmacy to utilize a [facility] outpatient pharmacy?   Pending    Assessment: 1. Acute systolic CHF (LVEF 30-35%), due to presumed NICM. NYHA class II symptoms. - Received IV lasix yesterday with good output. Strict I/Os and daily weights. Keep K>4 and Mag>2. - Consider starting metoprolol XL 12.5 mg daily once euvolemic - Consider starting Entresto 24/26 mg BID for HFrEF - Consider adding spironolactone and Farxiga 10 mg daily prior to discharge   Plan: 1) Medication changes recommended at this time: - Add Entresto 24/26 mg BID - Add metoprolol XL 12.5 mg daily  2) Patient assistance: - Has $7,000  remaining on deductible - Can use monthly copay cards that will pay toward deductible  3)  Education  - To be completed prior to discharge  Sharen Hones, PharmD, BCPS Heart Failure Engineer, building services Phone [phone]

## 2022-03-23 NOTE — TOC Progression Note (Signed)
Transition of Care [facility]) - Progression Note    Patient Details  Name: Jared [name] MRN: [id number] Date of Birth: [birth year]  Transition of [facility]) CM/SW Contact  Mearl Latin, LCSW Phone Number: 03/23/2022, 4:40 PM  Clinical Narrative:    CSW confirmed IVC paperwork on hard chart and was served. Patient for inpatient psych once bed available.         Expected Discharge Plan and Services                                               Social Determinants of Health (SDOH) Interventions SDOH Screenings   Housing: Low Risk  (03/23/2022)  Transportation Needs: No Transportation Needs (03/23/2022)  Alcohol Screen: Low Risk  (03/23/2022)  Financial Resource Strain: Low Risk  (03/23/2022)  Tobacco Use: Medium Risk (03/23/2022)    Readmission Risk Interventions     No data to display

## 2022-03-24 DIAGNOSIS — I5043 Acute on chronic combined systolic (congestive) and diastolic (congestive) heart failure: Secondary | ICD-10-CM | POA: Diagnosis not present

## 2022-03-24 DIAGNOSIS — J9601 Acute respiratory failure with hypoxia: Secondary | ICD-10-CM | POA: Diagnosis not present

## 2022-03-24 DIAGNOSIS — T40604A Poisoning by unspecified narcotics, undetermined, initial encounter: Secondary | ICD-10-CM | POA: Diagnosis not present

## 2022-03-24 LAB — SARS CORONAVIRUS 2 (TAT 6-24 HRS): SARS Coronavirus 2: NEGATIVE

## 2022-03-24 LAB — CULTURE, BLOOD (ROUTINE X 2)
Culture: NO GROWTH
Culture: NO GROWTH
Special Requests: ADEQUATE
Special Requests: ADEQUATE

## 2022-03-24 LAB — SARS CORONAVIRUS 2 BY RT PCR: SARS Coronavirus 2 by RT PCR: NEGATIVE

## 2022-03-24 MED ORDER — CARIPRAZINE HCL 3 MG PO CAPS: 3.0000 mg | ORAL_CAPSULE | Freq: Every day | ORAL | Status: DC

## 2022-03-24 MED ORDER — THIAMINE HCL 100 MG PO TABS: 100.0000 mg | ORAL_TABLET | Freq: Every day | ORAL | 0 refills | Status: AC

## 2022-03-24 MED ORDER — SPIRONOLACTONE 12.5 MG HALF TABLET
12.5000 mg | ORAL_TABLET | Freq: Every day | ORAL | Status: DC
  Administered 2022-03-24: 12.5 mg via ORAL
  Filled 2022-03-24 (×2): qty 1

## 2022-03-24 MED ORDER — METOPROLOL SUCCINATE ER 25 MG PO TB24: 12.5000 mg | ORAL_TABLET | Freq: Every day | ORAL | Status: DC

## 2022-03-24 MED ORDER — METOPROLOL SUCCINATE ER 25 MG PO TB24
12.5000 mg | ORAL_TABLET | Freq: Every day | ORAL | Status: DC
  Administered 2022-03-24: 12.5 mg via ORAL
  Filled 2022-03-24: qty 1

## 2022-03-24 MED ORDER — SACUBITRIL-VALSARTAN 24-26 MG PO TABS: 1.0000 | ORAL_TABLET | Freq: Two times a day (BID) | ORAL | Status: DC

## 2022-03-24 MED ORDER — SPIRONOLACTONE 25 MG PO TABS: 12.5000 mg | ORAL_TABLET | Freq: Every day | ORAL | Status: DC

## 2022-03-24 MED ORDER — SACUBITRIL-VALSARTAN 24-26 MG PO TABS
1.0000 | ORAL_TABLET | Freq: Two times a day (BID) | ORAL | Status: DC
  Administered 2022-03-24 (×2): 1 via ORAL
  Filled 2022-03-24 (×2): qty 1

## 2022-03-24 MED ORDER — DAPAGLIFLOZIN PROPANEDIOL 10 MG PO TABS
10.0000 mg | ORAL_TABLET | Freq: Every day | ORAL | Status: DC
  Administered 2022-03-24: 10 mg via ORAL
  Filled 2022-03-24 (×2): qty 1

## 2022-03-24 MED ORDER — DAPAGLIFLOZIN PROPANEDIOL 10 MG PO TABS: 10.0000 mg | ORAL_TABLET | Freq: Every day | ORAL | Status: DC

## 2022-03-24 MED ORDER — LORAZEPAM 2 MG/ML IJ SOLN
2.0000 mg | Freq: Four times a day (QID) | INTRAMUSCULAR | Status: DC | PRN
  Administered 2022-03-24: 2 mg via INTRAVENOUS
  Filled 2022-03-24: qty 1

## 2022-03-24 MED ORDER — GABAPENTIN 300 MG PO CAPS: 300.0000 mg | ORAL_CAPSULE | Freq: Two times a day (BID) | ORAL | Status: DC

## 2022-03-24 MED ORDER — LORAZEPAM 2 MG/ML IJ SOLN
2.0000 mg | Freq: Three times a day (TID) | INTRAMUSCULAR | Status: DC | PRN
  Administered 2022-03-24 – 2022-03-25 (×2): 2 mg via INTRAVENOUS
  Filled 2022-03-24 (×2): qty 1

## 2022-03-24 NOTE — Progress Notes (Signed)
PROGRESS NOTE        PATIENT DETAILS Name: Jared [name] Age: 41 y.o. Sex: male Date of Birth: 1981-01-22 Admit Date: 03/16/2022 Admitting Physician Tomma Lightning, MD ASN:[id number], No Pcp Per  Brief Summary: Patient is a 41 y.o.  male with history of polysubstance abuse-who presented to the hospital on 12/22 with septic shock-secondary to aspiration pneumonia in the setting of narcotic overdose.  He was subsequently admitted to the ICU-and upon further evaluation found to have new onset of acute systolic heart failure, he was briefly transferred to [facility] service on 12/24 but developed worsening respiratory failure on 12/25-he was subsequently found to be RSV positive and transferred back to the ICU on BiPAP.  He was stabilized and transferred back to [facility] on 12/29.  See below for further details.    Significant events: 12/22>> admit to ICU-septic shock/aspiration pneumonia/hypoxia 12/23>>Psych consult> suicide precautions, don't let leave AMA, d/c to inpatient psyche facility; started phenobarbital  12/24>> transferred to [facility]  12/25>> Increased WOB, Tm 103.42F-RSV positive-BiPAP-transfer back to ICU 12/27>> Remains on precedex, 10L HFNC 12/28>> off precedex 12/29>> transferred to [facility]  Significant studies: 12/22>> CXR: Diffuse alveolar densities right> left 12/22>> echo: EF 30-35% 12/28>> CTA chest: No PE, diffuse lung disease with patchy dense opacities right> left, mildly enlarged hilar/mediastinal lymph nodes.  Significant microbiology data: 12/22>> blood cultures: Negative 12/25>> RSV PCR: Positive 12/25>> COVID/influenza PCR: Negative 12/25>> blood culture: Negative  Procedures: None  Consults: PCCM  Subjective:   Patient in bed, appears comfortable, denies any headache, no fever, no chest pain or pressure, no shortness of breath , no abdominal pain. No new focal weakness.   Objective: Vitals: Blood pressure 120/68, pulse 86, temperature  97.6 F (36.4 C), temperature source Oral, resp. rate 17, height 5\' 9"  (1.753 m), weight 77.2 kg, SpO2 93 %.   Exam:  Awake Alert, No new F.N deficits, Normal affect [place].AT,PERRAL Supple Neck, No JVD,   Symmetrical Chest wall movement, Good air movement bilaterally, CTAB RRR,No Gallops, Rubs or new Murmurs,  +ve B.Sounds, Abd Soft, No tenderness,   No Cyanosis, Clubbing or edema    Assessment/Plan:  Acute hypoxic respiratory failure secondary to aspiration and RSV pneumonia, Septic shock Overall improved-was on BiPAP/HFNC while in the ICU-now on room air. Sepsis physiology has resolved Has completed 5 days of Zosyn for aspiration Cultures negative so far Continue bronchodilators and other supportive measures. Will need to repeat two-view CXR/CT chest in 4 to 6 weeks to document resolution of infiltrates  Acute metabolic encephalopathy due to hypoxia/EtOH withdrawal Substance abuse induced mood disorder/agitation Significantly better-calm/quiet this morning Completed a phenobarb taper while in the ICU, briefly required Precedex Psych following  Previous MD spoke with psych resident Dr. on 03/23/2022-they are getting conflicting collaborative information from mother.  Recommendations are to pursue inpatient psychiatry admission.  Patient is currently medically optimized to be transferred to inpatient psychiatry when a bed is available.  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 Await further  recommendations from psychiatry  Polysubstance abuse (alcohol/opioids/benzos) Counseled extensively this morning He is now out of the window for any sort of alcohol/drug withdrawal symptoms.  Substance-induced mood disorder On Vraylar and gabapentin per psychiatry  BMI: Estimated body mass index is 25.13 kg/m as calculated from the following:   Height as of this encounter: 5\' 9"  (1.753 m).   Weight as of this encounter: 77.2 kg.   Code status:   Code Status: Full Code   DVT Prophylaxis: enoxaparin (LOVENOX) injection 40 mg Start: 03/17/22 1145   Family Communication: None at bedside   Disposition Plan: Status is: Inpatient Remains inpatient appropriate because: Severity of illness.   Planned Discharge Destination[facility]   Diet: Diet Order             Diet regular Room service appropriate? Yes; Fluid consistency: Thin  Diet effective now                    MEDICATIONS: Scheduled Meds:  cariprazine  3 mg Oral Daily   Chlorhexidine Gluconate Cloth  6 each Topical Daily   enoxaparin (LOVENOX) injection  40 mg Subcutaneous Q24H   folic acid  1 mg Intravenous Daily   gabapentin  300 mg Oral BID   lidocaine  1 patch Transdermal Q24H   mouth rinse  15 mL Mouth Rinse 4 times per day   polyethylene glycol  17 g Oral Daily   senna-docusate  1 tablet Oral Q24H   [START ON 03/25/2022] thiamine (VITAMIN B1) injection  100 mg Intravenous Q24H   Continuous Infusions:  sodium chloride 250 mL (03/23/22 0823)   thiamine (VITAMIN B1) injection 500 mg (03/24/22 0805)   PRN Meds:.dextromethorphan-guaiFENesin, diphenhydrAMINE **OR** diphenhydrAMINE, haloperidol **OR** haloperidol lactate, ibuprofen, LORazepam, menthol-cetylpyridinium, ondansetron (ZOFRAN) IV, mouth rinse, polyethylene glycol   I have personally reviewed following labs and imaging studies  LABORATORY DATA:  Recent Labs  Lab 03/18/22 0346 03/19/22 0344 03/19/22 2208 03/21/22 1045 03/23/22 0104  WBC  26.2* 25.8* 26.8* 11.9* 13.1*  HGB 13.1 14.0 13.9 14.3 15.3  HCT 39.3 39.7 38.7* 41.0 43.4  PLT 195 199 200 225 300  MCV 94.0 90.8 89.4 89.5 88.0  MCH 31.3 32.0 32.1 31.2 31.0  MCHC 33.3 35.3 35.9 34.9 35.3  RDW 12.8 12.3 12.1 12.6 12.5    Recent Labs  Lab 03/18/22 0346 03/19/22 0344 03/19/22 2211 03/19/22 2335 03/21/22 1045 03/21/22 2221 03/22/22 0057 03/22/22 0910 03/23/22 0104  NA 134* 135  --  135 135  --  134*  --  136  K 3.6 3.4*  --  3.3* 3.8  --  3.4*  --  3.9  CL 103 105  --  105 105  --  101  --  101  CO2 23 22  --  21* 24  --  24  --  24  ANIONGAP 8 8  --  9 6  --  9  --  11  GLUCOSE 102* 133*  --  119* 121*  --  108*  --  105*  BUN 8 6  --  9 14  --  16  --  10  CREATININE 0.72 0.82  --  0.68 0.78  --  1.14  --  0.68  DDIMER  --   --   --   --   --  4.66*  --   --   --   LATICACIDVEN  --   --  1.2  --   --  2.3*  --   --   --   BNP  --   --   --   --   --   --  104.6*  --   --   MG 1.6*  --   --  1.8 2.1  --   --  2.2 1.9  CALCIUM 8.2* 8.0*  --  7.8* 7.8*  --  7.4*  --  8.1*   RADIOLOGY STUDIES/RESULTS: No results found.   LOS: 8 days   Signature  -    Susa Raring M.D on 03/24/2022 at 10:17 AM   -  To page go to [email or web address]

## 2022-03-24 NOTE — Discharge Instructions (Addendum)
Activity: As tolerated with Full fall precautions use Bhardwaj/cane & assistance as needed  Disposition [facility]  Diet: Heart Healthy with strict 1.5 L fluid restriction per day.  Special Instructions: If you have smoked or chewed Tobacco  in the last 2 yrs please stop smoking, stop any regular Alcohol  and or any Recreational drug use.  On your next visit with your primary care physician please Get Medicines reviewed and adjusted.  Please request your Prim.MD to go over all Hospital Tests and Procedure/Radiological results at the follow up, please get all Hospital records sent to your Prim MD by signing hospital release before you go home.  If you experience worsening of your admission symptoms, develop shortness of breath, life threatening emergency, suicidal or homicidal thoughts you must seek medical attention immediately by calling 911 or calling your MD immediately  if symptoms less severe.  You Must read complete instructions/literature along with all the possible adverse reactions/side effects for all the Medicines you take and that have been prescribed to you. Take any new Medicines after you have completely understood and accpet all the possible adverse reactions/side effects.

## 2022-03-24 NOTE — Progress Notes (Signed)
Pre-admit orders completed for transfer to [facility].   Note: patient has received Ativan 2 mg IV q4hr PRN for agitation over the past several days. Nursing notes are not indicative of severe agitation. Taper has started with patient receiving Ativan 2 mg TID PRN today. Will continue this for admission to [facility] where it can further be tapered.   Carlyn Reichert, MD PGY-2

## 2022-03-24 NOTE — Progress Notes (Addendum)
Rounding Note    Patient Name: Jared [name] Date of Encounter: 03/24/2022  [facility] Cardiologist: None   Subjective   He is lying flat. No issues  Inpatient Medications    Scheduled Meds:  cariprazine  3 mg Oral Daily   Chlorhexidine Gluconate Cloth  6 each Topical Daily   dapagliflozin propanediol  10 mg Oral Daily   enoxaparin (LOVENOX) injection  40 mg Subcutaneous Q24H   folic acid  1 mg Intravenous Daily   gabapentin  300 mg Oral BID   lidocaine  1 patch Transdermal Q24H   metoprolol succinate  12.5 mg Oral Daily   mouth rinse  15 mL Mouth Rinse 4 times per day   polyethylene glycol  17 g Oral Daily   sacubitril-valsartan  1 tablet Oral BID   senna-docusate  1 tablet Oral Q24H   spironolactone  12.5 mg Oral Daily   [START ON 03/25/2022] thiamine (VITAMIN B1) injection  100 mg Intravenous Q24H   Continuous Infusions:  sodium chloride 250 mL (03/23/22 0823)   thiamine (VITAMIN B1) injection 500 mg (03/24/22 0805)   PRN Meds: dextromethorphan-guaiFENesin, diphenhydrAMINE **OR** diphenhydrAMINE, haloperidol **OR** haloperidol lactate, ibuprofen, LORazepam, menthol-cetylpyridinium, ondansetron (ZOFRAN) IV, mouth rinse, polyethylene glycol   Vital Signs    Vitals:   03/24/22 0800 03/24/22 0830 03/24/22 0849 03/24/22 1030  BP:  120/68  128/83  Pulse:  86  70  Resp: (!) 21 (!) 26 17 20   Temp:  97.6 F (36.4 C)  97.9 F (36.6 C)  TempSrc:  Oral  Oral  SpO2:    99%  Weight:      Height:        Intake/Output Summary (Last 24 hours) at 03/24/2022 1059 Last data filed at 03/24/2022 0800 Gross per 24 hour  Intake 1800 ml  Output 650 ml  Net 1150 ml      03/22/2022    5:00 AM 03/21/2022    5:00 AM 03/20/2022    4:47 AM  Last 3 Weights  Weight (lbs) 170 lb 3.1 oz 178 lb 2.1 oz 179 lb 10.8 oz  Weight (kg) 77.2 kg 80.8 kg 81.5 kg      Telemetry    NSR - Personally Reviewed  ECG    No new - Personally Reviewed  Physical Exam    Vitals:   03/24/22 0849 03/24/22 1030  BP:  128/83  Pulse:  70  Resp: 17 20  Temp:  97.9 F (36.6 C)  SpO2:  99%    GEN: No acute distress.   Neck: No sig JVD Cardiac: RRR, no murmurs, rubs, or gallops.  Respiratory: Clear to auscultation bilaterally. GI: Soft, nontender, non-distended  MS: No edema; No deformity. Neuro:  Nonfocal  Psych: Normal affect   Labs    High Sensitivity Troponin:   Recent Labs  Lab 03/16/22 1155 03/16/22 2124 03/19/22 2208  TROPONINIHS 307* 387* 88*     Chemistry Recent Labs  Lab 03/21/22 1045 03/22/22 0057 03/22/22 0910 03/23/22 0104  NA 135 134*  --  136  K 3.8 3.4*  --  3.9  CL 105 101  --  101  CO2 24 24  --  24  GLUCOSE 121* 108*  --  105*  BUN 14 16  --  10  CREATININE 0.78 1.14  --  0.68  CALCIUM 7.8* 7.4*  --  8.1*  MG 2.1  --  2.2 1.9  GFRNONAA >60 >60  --  >60  ANIONGAP 6 9  --  11    Lipids No results for input(s): "CHOL", "TRIG", "HDL", "LABVLDL", "LDLCALC", "CHOLHDL" in the last 168 hours.  Hematology Recent Labs  Lab 03/19/22 2208 03/21/22 1045 03/23/22 0104  WBC 26.8* 11.9* 13.1*  RBC 4.33 4.58 4.93  HGB 13.9 14.3 15.3  HCT 38.7* 41.0 43.4  MCV 89.4 89.5 88.0  MCH 32.1 31.2 31.0  MCHC 35.9 34.9 35.3  RDW 12.1 12.6 12.5  PLT 200 225 300   Thyroid No results for input(s): "TSH", "FREET4" in the last 168 hours.  BNP Recent Labs  Lab 03/22/22 0057  BNP 104.6*    DDimer  Recent Labs  Lab 03/21/22 2221  DDIMER 4.66*     Radiology    No results found.  Cardiac Studies   TTE 03/16/2022   1. Left ventricular ejection fraction, by estimation, is 30 to 35%. The left ventricle has moderately decreased function. The left ventricle demonstrates global hypokinesis. Left ventricular diastolic parameters are  indeterminate.   2. Right ventricular systolic function is mildly reduced. The right ventricular size is normal. There is normal pulmonary artery systolic  pressure.   3. No evidence of mitral  valve regurgitation.   4. The aortic valve is tricuspid. Aortic valve regurgitation is not  visualized.   5. The inferior vena cava is dilated in size with >50% respiratory  variability, suggesting right atrial pressure of 8 mmHg.    Patient Profile     41 y.o. male with a hx of HTN, Asthma, Hep C, polysubstance abuse/overdose  who is being seen 03/22/2022 for the evaluation of cardiomyopathy at the request of Dr. Lake Bells.   Assessment & Plan    Systolic HF EF 99991111: in the setting of overdose; presumed NICM. Euvolemic. Stable. -starting GDMT, see Megan Boothby's note - if BP /crt tolerate this regimen, he can be discharged on these medications. May be challenging cost wise with high deductible  - he has HF clinic appt 04/09/2022   Time Spent Directly with Patient:  I have spent a total of 35 minutes with the patient reviewing hospital notes, telemetry, EKGs, labs and examining the patient as well as establishing an assessment and plan that was discussed personally with the patient.  > 50% of time was spent in direct patient care.   For questions or updates, please contact [place] Please consult [email or web address] for contact info under        Signed, Janina Mayo, MD  03/24/2022, 10:59 AM

## 2022-03-24 NOTE — Progress Notes (Signed)
CSW faxed patient's IVC to Antionette, RN at [facility] for review.  CSW provided unit secretary with contact information to call non-emergency sheriff for transportation to [facility] once COVID results as negative.  Edwin Dada, MSW, LCSW Transitions of Care  Clinical Social Worker II [phone]

## 2022-03-24 NOTE — Discharge Summary (Signed)
Jared [name] ZOX:[id number] DOB: 28-Mar-1980 DOA: 03/16/2022  PCP: Patient, No Pcp Per  Admit date: 03/16/2022  Discharge date: 03/24/2022  Admitted From: Home   Disposition:  [facility]   Recommendations for Outpatient Follow-up:   Follow up with PCP in 1-2 weeks  PCP Please obtain BMP/CBC, 2 view CXR in 1week,  (see Discharge instructions)   PCP Please follow up on the following pending results:    Home Health: None   Equipment/Devices: None  Consultations: Cards, Psych Discharge Condition: Fair  CODE STATUS: Full    Diet Recommendation: Heart Healthy strict 1.5 L fluid restriction per day  Diet Order             Diet regular Room service appropriate? Yes; Fluid consistency: Thin  Diet effective now                    Chief Complaint  Patient presents with   Drug Overdose     Brief history of present illness from the day of admission and additional interim summary    41 y.o.  male with history of polysubstance abuse-who presented to the hospital on 12/22 with septic shock-secondary to aspiration pneumonia in the setting of narcotic overdose.  He was subsequently admitted to the ICU-and upon further evaluation found to have new onset of acute systolic heart failure, he was briefly transferred to [facility] service on 12/24 but developed worsening respiratory failure on 12/25-he was subsequently found to be RSV positive and transferred back to the ICU on BiPAP.  He was stabilized and transferred back to [facility] on 12/29.  See below for further details.     Significant events: 12/22>> admit to ICU-septic shock/aspiration pneumonia/hypoxia 12/23>>Psych consult> suicide precautions, don't let leave AMA, d/c to inpatient psyche facility; started phenobarbital  12/24>> transferred to [facility]  12/25>> Increased WOB, Tm  103.2F-RSV positive-BiPAP-transfer back to ICU 12/27>> Remains on precedex, 10L HFNC 12/28>> off precedex 12/29>> transferred to [facility]   Significant studies: 12/22>> CXR: Diffuse alveolar densities right> left 12/22>> echo: EF 30-35% 12/28>> CTA chest: No PE, diffuse lung disease with patchy dense opacities right> left, mildly enlarged hilar/mediastinal lymph nodes.   Significant microbiology data: 12/22>> blood cultures: Negative 12/25>> RSV PCR: Positive 12/25>> COVID/influenza PCR: Negative 12/25>> blood culture: Negative   Procedures: None   Consults: PCCM, Psych, Cards                                                                 Hospital Course   Acute hypoxic respiratory failure secondary to aspiration and RSV pneumonia, Septic shock Overall improved-was on BiPAP/HFNC while in the ICU-now on room air. Sepsis physiology has resolved Has completed 5 days of Zosyn for aspiration Cultures negative so far Continue bronchodilators and other supportive measures. Will need to repeat two-view  CXR/CT chest in 4 to 6 weeks to document resolution of infiltrates, check BMP and magnesium every 7 to 10 days.   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   Drug overdose denied intentional drug overdose Psych team  following-recommendations are to continue 1/1 sitter-per psych now qualifies for inpatient transfer.     Polysubstance abuse (alcohol/opioids/benzos) Counseled extensively this morning He is now out of the window for any sort of alcohol/drug withdrawal symptoms.   Substance-induced mood disorder On Vraylar and gabapentin per psychiatry   Discharge diagnosis     Principal Problem:   Intentional heroin overdose (HCC) Active Problems:   Aspiration pneumonia (HCC)   Acute hypoxic respiratory failure (HCC)   ARDS (adult respiratory distress syndrome) (HCC)   Alteration in electrolyte and fluid balance   Polysubstance dependence including opioid type drug, continuous use (HCC)   Septic shock (HCC)   Opiate overdose (HCC)   AKI (acute kidney injury) (HCC)   Hypomagnesemia   Hypophosphatemia   Acute respiratory failure with hypoxia [facility])    Discharge instructions    Discharge Instructions     Discharge instructions   Complete by: As directed    Activity: As tolerated with Full fall precautions use Stieg/cane & assistance as needed  Disposition [facility]  Diet: Heart Healthy with strict 1.5 L fluid restriction per day.  Special Instructions: If you have smoked or chewed Tobacco  in the last 2 yrs please stop smoking, stop any regular Alcohol  and or any Recreational drug use.  On your next visit with your primary care physician please Get Medicines reviewed and adjusted.  Please request your Prim.MD to go over all Hospital Tests and Procedure/Radiological results at the follow up, please get all Hospital records sent to your Prim MD by signing hospital release before you go home.  If you experience worsening of your admission symptoms, develop shortness of breath, life threatening emergency, suicidal or homicidal thoughts you must seek medical attention immediately by calling 911 or calling your MD immediately  if symptoms less severe.  You Must read complete  instructions/literature along with all the possible adverse reactions/side effects for all the Medicines you take and that have been prescribed to you. Take any new Medicines after you have completely understood and accpet all the possible adverse reactions/side effects.   No wound care   Complete by: As directed        Discharge Medications   Allergies as of 03/24/2022       Reactions   Tylenol [acetaminophen] Rash        Medication List     STOP taking these medications    hydrOXYzine 25 MG tablet Commonly known as: ATARAX   ibuprofen 200 MG tablet Commonly known as: ADVIL   multivitamin with minerals Tabs tablet   omeprazole 40 MG capsule Commonly known as: PRILOSEC   traZODone 50 MG tablet Commonly known as: DESYREL  TAKE these medications    cariprazine 3 MG capsule Commonly known as: Vraylar Take 1 capsule (3 mg total) by mouth daily. What changed:  medication strength how much to take when to take this additional instructions   dapagliflozin propanediol 10 MG Tabs tablet Commonly known as: FARXIGA Take 1 tablet (10 mg total) by mouth daily.   gabapentin 300 MG capsule Commonly known as: NEURONTIN Take 1 capsule (300 mg total) by mouth 2 (two) times daily. What changed:  when to take this additional instructions   metoprolol succinate 25 MG 24 hr tablet Commonly known as: TOPROL-XL Take 0.5 tablets (12.5 mg total) by mouth daily.   sacubitril-valsartan 24-26 MG Commonly known as: ENTRESTO Take 1 tablet by mouth 2 (two) times daily.   spironolactone 25 MG tablet Commonly known as: ALDACTONE Take 0.5 tablets (12.5 mg total) by mouth daily.   thiamine 100 MG tablet Commonly known as: VITAMIN B1 Take 1 tablet (100 mg total) by mouth daily.         Follow-up Information     [facility]. Go in 16 day(s).   Specialty: Cardiology Why: Hospital follow up 04/09/22 PLEASE bring a current  medication list to appointment FREE valet parking, Entrance C, off [address] information: [address] [id number] mc [place] [address] [phone]        [facility]. Schedule an appointment as soon as possible for a visit in 1 week(s).   Contact information: [address] [place] [address] [phone]                Major procedures and Radiology Reports - PLEASE review detailed and final reports thoroughly  -      CT Angio Chest Pulmonary Embolism (PE) W or WO Contrast  Result Date: 03/22/2022 CLINICAL DATA:  Pulmonary embolism suspected, with positive D-dimer. EXAM: CT ANGIOGRAPHY CHEST WITH CONTRAST TECHNIQUE: Multidetector CT imaging of the chest was performed using the standard protocol during bolus administration of intravenous contrast. Multiplanar CT image reconstructions and MIPs were obtained to evaluate the vascular anatomy. RADIATION DOSE REDUCTION: This exam was performed according to the departmental dose-optimization program which includes automated exposure control, adjustment of the mA and/or kV according to patient size and/or use of iterative reconstruction technique. CONTRAST:  21mL OMNIPAQUE IOHEXOL 350 MG/ML SOLN COMPARISON:  Recent chest films, from yesterday and December 22-25. Chest CT with IV contrast 12/11/2013 FINDINGS: Cardiovascular: The heart is slightly enlarged. There are no visible coronary artery calcifications. Small pericardial effusion new from the prior study. The aorta and great vessels are normal. The pulmonary veins are decompressed. There is a prominent pulmonary trunk 3.4 cm indicating arterial hypertension but no visible arterial embolus. Mediastinum/Nodes: Small hiatal hernia. There is moderate thickening of the distal thoracic esophagus consistent with esophagitis. There are mildly enlarged bilateral hilar lymph nodes, largest 1.4 cm  in short axis. There are similarly prominent subcarinal, precarinal and right paratracheal space lymph nodes in the mediastinum. There are no enlarged axillary lymph nodes. No thyroid mass. There are mild retained secretions in the trachea versus aspirate. Both main bronchi are patent. Lungs/Pleura: There are symmetric small layering pleural effusions. There is no pneumothorax. There are patchy dense opacities throughout the lungs, denser and more confluent on the right than left, with underlying interstitial thickening. There is relative but not complete sparing of the peripheral lungs, relative sparing in the left-greater-than-right bases. There  are small scattered nodular alveolar shadows in the posterior bases. Diffuse bronchial thickening is also noted. There may be scattered small bronchiolar impactions in the basal lower lobes but this is difficult to confirm with the degree of respiratory motion. Upper Abdomen: No acute abnormality. Musculoskeletal: Slight thoracic kyphodextroscoliosis. No acute or significant osseous findings. There is body wall anasarca but no chest wall mass is seen. Review of the MIP images confirms the above findings. IMPRESSION: 1. No evidence of arterial embolus. 2. Prominent pulmonary trunk 3.4 cm indicating arterial hypertension. 3. Cardiomegaly with small pericardial effusion, small pleural effusions, and body wall anasarca. 4. Diffuse lung disease with patchy dense opacities denser and more confluent on the right than left, with underlying interstitial thickening and small scattered nodular alveolar shadows in the posterior bases. Findings could be due to pulmonary edema, atypical infection, or combination. 5. Diffuse bronchial thickening with possible scattered small bronchiolar impactions in the basal lower lobes but this is difficult to confirm with the degree of respiratory motion. 6. Small hiatal hernia with moderate thickening of the distal thoracic esophagus consistent with  esophagitis. 7. Mildly enlarged hilar and mediastinal lymph nodes, possibly reactive but nonspecific. 8. Retained secretions versus aspirate in the trachea. Electronically Signed   By: Almira Bar M.D.   On: 03/22/2022 02:15   DG Chest Port 1 View  Result Date: 03/21/2022 CLINICAL DATA:  10027.  Tachypnea.  Drug overdose. EXAM: PORTABLE CHEST 1 VIEW COMPARISON:  Portable chest 03/19/2022 FINDINGS: The cardiac size is normal.  Central vascular prominence is similar. Widespread right-greater-than-left airspace disease is still seen with relative left apical and basal sparing, but is mildly improved and less confluent and in general less dense than previously. Underlying interstitial prominence appears similar. There are trace pleural effusions, also unchanged. The mediastinum is normally outlined. Thoracic cage is intact. IMPRESSION: 1. Widespread right-greater-than-left airspace disease is mildly improved. Underlying interstitial prominence and central vascular prominence appears similar. No new findings. 2. Trace pleural effusions. Electronically Signed   By: Almira Bar M.D.   On: 03/21/2022 21:57   DG CHEST PORT 1 VIEW  Result Date: 03/19/2022 CLINICAL DATA:  Respiratory distress EXAM: PORTABLE CHEST 1 VIEW COMPARISON:  03/17/2022 FINDINGS: Stable heart size. Extensive bilateral airspace opacities. Little interval change from prior. No large pleural fluid collection. No pneumothorax. IMPRESSION: Extensive bilateral airspace opacities, similar to prior. Electronically Signed   By: Duanne Guess D.O.   On: 03/19/2022 21:43   DG Chest Port 1 View  Result Date: 03/17/2022 CLINICAL DATA:  Evaluate for pneumonia. EXAM: PORTABLE CHEST 1 VIEW COMPARISON:  03/16/2022 FINDINGS: Heart size and mediastinal contours are stable. Bilateral airspace opacities are identified in appear unchanged from the previous exam. No pleural effusion or edema identified. Visualized osseous structures are unremarkable.  IMPRESSION: No change in bilateral airspace opacities compatible with multifocal pneumonia. Electronically Signed   By: Signa Kell M.D.   On: 03/17/2022 06:23   ECHOCARDIOGRAM COMPLETE  Result Date: 03/16/2022    ECHOCARDIOGRAM REPORT   Patient Name:   Jared [name] Date of Exam: 03/16/2022 Medical Rec #:  [id number]         Height:       69.0 in Accession #:    [id number]        Weight:       185.2 lb Date of Birth:  11/15/1980         BSA:          2.000 m Patient Age:  41 years          BP:           108/88 mmHg Patient Gender: M                 HR:           86 bpm. Exam Location:  Inpatient Procedure: 2D Echo Indications:    acute respiratory distress  History:        Patient has no prior history of Echocardiogram examinations.                 Risk Factors:polysubstance abuse.  Sonographer:    Delcie Roch RDCS Referring Phys: [id number] PETER E BABCOCK IMPRESSIONS  1. Left ventricular ejection fraction, by estimation, is 30 to 35%. The left ventricle has moderately decreased function. The left ventricle demonstrates global hypokinesis. Left ventricular diastolic parameters are indeterminate.  2. Right ventricular systolic function is mildly reduced. The right ventricular size is normal. There is normal pulmonary artery systolic pressure.  3. No evidence of mitral valve regurgitation.  4. The aortic valve is tricuspid. Aortic valve regurgitation is not visualized.  5. The inferior vena cava is dilated in size with >50% respiratory variability, suggesting right atrial pressure of 8 mmHg. Comparison(s): No prior Echocardiogram. FINDINGS  Left Ventricle: Left ventricular ejection fraction, by estimation, is 30 to 35%. The left ventricle has moderately decreased function. The left ventricle demonstrates global hypokinesis. The left ventricular internal cavity size was normal in size. There is no left ventricular hypertrophy. Left ventricular diastolic parameters are indeterminate. Right Ventricle: The  right ventricular size is normal. Right ventricular systolic function is mildly reduced. There is normal pulmonary artery systolic pressure. The tricuspid regurgitant velocity is 1.79 m/s, and with an assumed right atrial pressure of  8 mmHg, the estimated right ventricular systolic pressure is 20.8 mmHg. Left Atrium: Left atrial size was normal in size. Right Atrium: Right atrial size was normal in size. Pericardium: There is no evidence of pericardial effusion. Mitral Valve: No evidence of mitral valve regurgitation. Tricuspid Valve: Tricuspid valve regurgitation is trivial. Aortic Valve: The aortic valve is tricuspid. Aortic valve regurgitation is not visualized. Pulmonic Valve: Pulmonic valve regurgitation is not visualized. Aorta: The aortic root and ascending aorta are structurally normal, with no evidence of dilitation. Venous: The inferior vena cava is dilated in size with greater than 50% respiratory variability, suggesting right atrial pressure of 8 mmHg. IAS/Shunts: No atrial level shunt detected by color flow Doppler.  LEFT VENTRICLE PLAX 2D LVIDd:         5.10 cm     Diastology LVIDs:         4.20 cm     LV e' medial:    6.74 cm/s LV PW:         1.00 cm     LV E/e' medial:  7.1 LV IVS:        0.90 cm     LV e' lateral:   12.40 cm/s LVOT diam:     2.50 cm     LV E/e' lateral: 3.9 LV SV:         55 LV SV Index:   27 LVOT Area:     4.91 cm                             3D Volume EF: LV Volumes (MOD)  3D EF:        33 % LV vol d, MOD A2C: 90.3 ml LV EDV:       139 ml LV vol s, MOD A2C: 70.6 ml LV ESV:       94 ml LV SV MOD A2C:     19.7 ml LV SV:        46 ml RIGHT VENTRICLE             IVC RV Basal diam:  3.10 cm     IVC diam: 2.30 cm RV S prime:     11.40 cm/s TAPSE (M-mode): 1.6 cm LEFT ATRIUM             Index        RIGHT ATRIUM           Index LA diam:        3.40 cm 1.70 cm/m   RA Area:     13.60 cm LA Vol (A2C):   33.4 ml 16.70 ml/m  RA Volume:   36.20 ml  18.10 ml/m LA Vol (A4C):   40.1  ml 20.05 ml/m LA Biplane Vol: 38.8 ml 19.40 ml/m  AORTIC VALVE LVOT Vmax:   84.20 cm/s LVOT Vmean:  52.700 cm/s LVOT VTI:    0.112 m  AORTA Ao Root diam: 3.20 cm Ao Asc diam:  3.00 cm MITRAL VALVE               TRICUSPID VALVE MV Area (PHT): 4.68 cm    TR Peak grad:   12.8 mmHg MV Decel Time: 162 msec    TR Vmax:        179.00 cm/s MV E velocity: 47.90 cm/s MV A velocity: 33.10 cm/s  SHUNTS MV E/A ratio:  1.45        Systemic VTI:  0.11 m                            Systemic Diam: 2.50 cm Carolan Clines Electronically signed by Carolan Clines Signature Date/Time: 03/16/2022/4:35:56 PM    Final    DG Chest Portable 1 View  Result Date: 03/16/2022 CLINICAL DATA:  Hypoxia, tachypnea EXAM: PORTABLE CHEST 1 VIEW COMPARISON:  08/06/2013 FINDINGS: Transverse diameter of heart is increased. Diffuse alveolar densities are seen in both parahilar regions and both lower lung fields. Left lateral CP angle is indistinct. There is no pneumothorax. IMPRESSION: Diffuse alveolar densities seen in both lungs, more so in right lower lung field. Findings suggest multifocal pneumonia. Possibility of superimposed pulmonary edema is not excluded. Possible minimal left pleural effusion. Electronically Signed   By: Ernie Avena M.D.   On: 03/16/2022 13:00    Today   Subjective    Jared [name] today has no headache,no chest abdominal pain,no new weakness tingling or numbness, feels much better wants to go home today.    Objective   Blood pressure 128/83, pulse 70, temperature 97.9 F (36.6 C), temperature source Oral, resp. rate 20, height 5\' 9"  (1.753 m), weight 77.2 kg, SpO2 99 %.   Intake/Output Summary (Last 24 hours) at 03/24/2022 1137 Last data filed at 03/24/2022 0800 Gross per 24 hour  Intake 1800 ml  Output 650 ml  Net 1150 ml    Exam  Awake Alert, No new F.N deficits,    [place].AT,PERRAL Supple Neck,   Symmetrical Chest wall movement, Good air movement bilaterally, CTAB RRR,No Gallops,   +ve  B.Sounds, Abd Soft, Non  tender,  No Cyanosis, Clubbing or edema    Data Review   Recent Labs  Lab 03/18/22 0346 03/19/22 0344 03/19/22 2208 03/21/22 1045 03/23/22 0104  WBC 26.2* 25.8* 26.8* 11.9* 13.1*  HGB 13.1 14.0 13.9 14.3 15.3  HCT 39.3 39.7 38.7* 41.0 43.4  PLT 195 199 200 225 300  MCV 94.0 90.8 89.4 89.5 88.0  MCH 31.3 32.0 32.1 31.2 31.0  MCHC 33.3 35.3 35.9 34.9 35.3  RDW 12.8 12.3 12.1 12.6 12.5    Recent Labs  Lab 03/18/22 0346 03/19/22 0344 03/19/22 2211 03/19/22 2335 03/21/22 1045 03/21/22 2221 03/22/22 0057 03/22/22 0910 03/23/22 0104  NA 134* 135  --  135 135  --  134*  --  136  K 3.6 3.4*  --  3.3* 3.8  --  3.4*  --  3.9  CL 103 105  --  105 105  --  101  --  101  CO2 23 22  --  21* 24  --  24  --  24  ANIONGAP 8 8  --  9 6  --  9  --  11  GLUCOSE 102* 133*  --  119* 121*  --  108*  --  105*  BUN 8 6  --  9 14  --  16  --  10  CREATININE 0.72 0.82  --  0.68 0.78  --  1.14  --  0.68  DDIMER  --   --   --   --   --  4.66*  --   --   --   LATICACIDVEN  --   --  1.2  --   --  2.3*  --   --   --   BNP  --   --   --   --   --   --  104.6*  --   --   MG 1.6*  --   --  1.8 2.1  --   --  2.2 1.9  CALCIUM 8.2* 8.0*  --  7.8* 7.8*  --  7.4*  --  8.1*     Total Time in preparing paper work, data evaluation and todays exam - 35 minutes  Signature  -    Susa Raring M.D on 03/24/2022 at 11:37 AM   -  To page go to [email or web address]

## 2022-03-25 ENCOUNTER — Inpatient Hospital Stay (HOSPITAL_COMMUNITY)
Admission: RE | Admit: 2022-03-25 | Discharge: 2022-04-01 | DRG: 885 | Disposition: A | Discharge status: Home / Self Care | Payer: BC Managed Care – PPO | Source: Intra-hospital | Attending: Psychiatry | Admitting: Psychiatry

## 2022-03-25 ENCOUNTER — Encounter (HOSPITAL_COMMUNITY): Payer: Self-pay | Admitting: Student

## 2022-03-25 ENCOUNTER — Other Ambulatory Visit: Payer: Self-pay

## 2022-03-25 DIAGNOSIS — F411 Generalized anxiety disorder: Secondary | ICD-10-CM | POA: Insufficient documentation

## 2022-03-25 DIAGNOSIS — Z23 Encounter for immunization: Secondary | ICD-10-CM | POA: Diagnosis not present

## 2022-03-25 DIAGNOSIS — F1120 Opioid dependence, uncomplicated: Secondary | ICD-10-CM | POA: Diagnosis present

## 2022-03-25 DIAGNOSIS — J45909 Unspecified asthma, uncomplicated: Secondary | ICD-10-CM | POA: Diagnosis present

## 2022-03-25 DIAGNOSIS — I509 Heart failure, unspecified: Secondary | ICD-10-CM | POA: Diagnosis present

## 2022-03-25 DIAGNOSIS — F314 Bipolar disorder, current episode depressed, severe, without psychotic features: Secondary | ICD-10-CM | POA: Diagnosis present

## 2022-03-25 DIAGNOSIS — E119 Type 2 diabetes mellitus without complications: Secondary | ICD-10-CM | POA: Diagnosis present

## 2022-03-25 DIAGNOSIS — Z7984 Long term (current) use of oral hypoglycemic drugs: Secondary | ICD-10-CM | POA: Diagnosis not present

## 2022-03-25 DIAGNOSIS — Z79899 Other long term (current) drug therapy: Secondary | ICD-10-CM

## 2022-03-25 DIAGNOSIS — F17210 Nicotine dependence, cigarettes, uncomplicated: Secondary | ICD-10-CM | POA: Diagnosis present

## 2022-03-25 DIAGNOSIS — R45851 Suicidal ideations: Secondary | ICD-10-CM | POA: Diagnosis present

## 2022-03-25 DIAGNOSIS — F4310 Post-traumatic stress disorder, unspecified: Secondary | ICD-10-CM | POA: Diagnosis present

## 2022-03-25 DIAGNOSIS — Z818 Family history of other mental and behavioral disorders: Secondary | ICD-10-CM | POA: Diagnosis not present

## 2022-03-25 DIAGNOSIS — Z8249 Family history of ischemic heart disease and other diseases of the circulatory system: Secondary | ICD-10-CM

## 2022-03-25 DIAGNOSIS — F1090 Alcohol use, unspecified, uncomplicated: Secondary | ICD-10-CM | POA: Insufficient documentation

## 2022-03-25 DIAGNOSIS — Z9151 Personal history of suicidal behavior: Secondary | ICD-10-CM | POA: Diagnosis not present

## 2022-03-25 DIAGNOSIS — F1010 Alcohol abuse, uncomplicated: Secondary | ICD-10-CM | POA: Diagnosis present

## 2022-03-25 DIAGNOSIS — G4700 Insomnia, unspecified: Secondary | ICD-10-CM | POA: Diagnosis present

## 2022-03-25 DIAGNOSIS — I110 Hypertensive heart disease with heart failure: Secondary | ICD-10-CM | POA: Diagnosis present

## 2022-03-25 DIAGNOSIS — F1320 Sedative, hypnotic or anxiolytic dependence, uncomplicated: Secondary | ICD-10-CM | POA: Insufficient documentation

## 2022-03-25 DIAGNOSIS — F1994 Other psychoactive substance use, unspecified with psychoactive substance-induced mood disorder: Secondary | ICD-10-CM | POA: Diagnosis present

## 2022-03-25 DIAGNOSIS — G8929 Other chronic pain: Secondary | ICD-10-CM | POA: Diagnosis present

## 2022-03-25 MED ORDER — ACETAMINOPHEN 325 MG PO TABS: 650.0000 mg | ORAL_TABLET | Freq: Four times a day (QID) | ORAL | Status: DC | PRN

## 2022-03-25 MED ORDER — DIPHENHYDRAMINE HCL 25 MG PO CAPS
50.0000 mg | ORAL_CAPSULE | Freq: Four times a day (QID) | ORAL | Status: DC | PRN
  Filled 2022-03-25 (×3): qty 2

## 2022-03-25 MED ORDER — CARIPRAZINE HCL 3 MG PO CAPS
3.0000 mg | ORAL_CAPSULE | Freq: Every day | ORAL | Status: DC
  Administered 2022-03-25 – 2022-04-01 (×8): 3 mg via ORAL
  Filled 2022-03-25 (×10): qty 1

## 2022-03-25 MED ORDER — METOPROLOL SUCCINATE 12.5 MG HALF TABLET
12.5000 mg | ORAL_TABLET | Freq: Every day | ORAL | Status: DC
  Administered 2022-03-25 – 2022-04-01 (×8): 12.5 mg via ORAL
  Filled 2022-03-25 (×9): qty 1

## 2022-03-25 MED ORDER — DAPAGLIFLOZIN PROPANEDIOL 10 MG PO TABS
10.0000 mg | ORAL_TABLET | Freq: Every day | ORAL | Status: DC
  Administered 2022-03-25 – 2022-04-01 (×8): 10 mg via ORAL
  Filled 2022-03-25 (×9): qty 1

## 2022-03-25 MED ORDER — LORAZEPAM 1 MG PO TABS
2.0000 mg | ORAL_TABLET | Freq: Three times a day (TID) | ORAL | Status: DC
  Administered 2022-03-25 – 2022-03-26 (×4): 2 mg via ORAL
  Filled 2022-03-25 (×4): qty 2

## 2022-03-25 MED ORDER — HYDROXYZINE HCL 25 MG PO TABS
25.0000 mg | ORAL_TABLET | Freq: Three times a day (TID) | ORAL | Status: DC | PRN
  Administered 2022-03-26 – 2022-03-30 (×5): 25 mg via ORAL
  Filled 2022-03-25 (×5): qty 1

## 2022-03-25 MED ORDER — IBUPROFEN 600 MG PO TABS: 600.0000 mg | ORAL_TABLET | Freq: Four times a day (QID) | ORAL | Status: DC | PRN

## 2022-03-25 MED ORDER — SPIRONOLACTONE 12.5 MG HALF TABLET
12.5000 mg | ORAL_TABLET | Freq: Every day | ORAL | Status: DC
  Administered 2022-03-25 – 2022-04-01 (×8): 12.5 mg via ORAL
  Filled 2022-03-25 (×9): qty 1

## 2022-03-25 MED ORDER — ZIPRASIDONE MESYLATE 20 MG IM SOLR: 20.0000 mg | INTRAMUSCULAR | Status: DC | PRN

## 2022-03-25 MED ORDER — LORAZEPAM 1 MG PO TABS: 1.0000 mg | ORAL_TABLET | ORAL | Status: DC | PRN

## 2022-03-25 MED ORDER — ALUM & MAG HYDROXIDE-SIMETH 200-200-20 MG/5ML PO SUSP: 30.0000 mL | ORAL | Status: DC | PRN

## 2022-03-25 MED ORDER — MAGNESIUM HYDROXIDE 400 MG/5ML PO SUSP: 30.0000 mL | Freq: Every day | ORAL | Status: DC | PRN

## 2022-03-25 MED ORDER — SACUBITRIL-VALSARTAN 24-26 MG PO TABS
1.0000 | ORAL_TABLET | Freq: Two times a day (BID) | ORAL | Status: DC
  Administered 2022-03-25 – 2022-04-01 (×15): 1 via ORAL
  Filled 2022-03-25 (×17): qty 1

## 2022-03-25 MED ORDER — OLANZAPINE 5 MG PO TBDP: 5.0000 mg | ORAL_TABLET | Freq: Three times a day (TID) | ORAL | Status: DC | PRN

## 2022-03-25 MED ORDER — INFLUENZA VAC SPLIT QUAD 0.5 ML IM SUSY
0.5000 mL | PREFILLED_SYRINGE | INTRAMUSCULAR | Status: AC
  Administered 2022-03-26: 0.5 mL via INTRAMUSCULAR
  Filled 2022-03-25: qty 0.5

## 2022-03-25 MED ORDER — GABAPENTIN 300 MG PO CAPS
300.0000 mg | ORAL_CAPSULE | Freq: Three times a day (TID) | ORAL | Status: DC
  Administered 2022-03-25 – 2022-04-01 (×21): 300 mg via ORAL
  Filled 2022-03-25 (×24): qty 1

## 2022-03-25 NOTE — Tx Team (Signed)
Initial Treatment Plan 03/25/2022 2:55 PM Jared [name] XID:[id number]    PATIENT STRESSORS: Legal issue   Marital or family conflict   Medication change or noncompliance   Substance abuse     PATIENT STRENGTHS: Average or above average intelligence  Capable of independent living  Communication skills  General fund of knowledge  Motivation for treatment/growth  Supportive family/friends    PATIENT IDENTIFIED PROBLEMS: Suicide Risk  Polysubstance abuse  Coping skills for depression  Safety Risk to self and his mother. HI in past towards her.               DISCHARGE CRITERIA:  Improved stabilization in mood, thinking, and/or behavior Need for constant or close observation no longer present Reduction of life-threatening or endangering symptoms to within safe limits  PRELIMINARY DISCHARGE PLAN: Placement in alternative living arrangements Pts mother states that she does not want pt to return home with her.  PATIENT/FAMILY INVOLVEMENT: This treatment plan has been presented to and reviewed with the patient, Jared [name].  The patient has been given the opportunity to ask questions and make suggestions.  Karren Burly, RN 03/25/2022, 2:55 PM

## 2022-03-25 NOTE — [facility] Group Notes (Signed)
Adult Psychoeducational Group Note Date:  03/25/2022 Time:  0900-1000 Group Topic/Focus: PROGRESSIVE RELAXATION. [name] group where deep breathing is taught and tensing and relaxation muscle groups is used. Imagery is used as well.  Pts are asked to imagine 3 pillars that hold them up when they are not able to hold themselves up and to share that with the group.   Participation Level:  did not attend   : Jared [name]   

## 2022-03-25 NOTE — [facility] Group Notes (Signed)
Adult Psychoeducational Group  Date:  03/25/2022 Time:  1100-1200  Group Topic/Focus: Continuation of the group from Saturday. Looking at the lists that were created and talking about what needs to be done with the homework of 30 positives about themselves.                                     Talking about taking their power back and helping themselves to develop a positive self esteem.      Participation Quality:  did not attend  Jared [name] A   

## 2022-03-25 NOTE — [facility] Group Notes (Signed)
Pt did not attend wrap-up group   

## 2022-03-25 NOTE — Progress Notes (Signed)
Pt is a 41 year old male received from [facility] under IVC.  Pt received s/p overdose of heroin, xanax and alcohol. He was found unresponsive by mother on 12/22 was transported to ED and was subsequently diagnosed with aspiration PNA, and heart failure requiring ICU support.  Pt denies suicidal ideation, states that he got involved with a friend "I thought I could trust."  Pt states that he consumed cocaine and woke up later at the ED, Pt has a hx of polysubstance abuse, was released from (7 years in jail) a few months ago.  Pt states that he is "below hitting bottom at this point" and has remorse for "ruining so many lives."  States in past was connected with cartels and able to sell large volumes of drugs. "I had no idea there was Fentanyl in the stuff."  Pt denies intentional overdose.   Mother called a couple times this shift, states that she does not feel safe with pt in the home. "He is suave and able to convince me, and I don't want to, I can't." Pt calm and cooperative throughout the assessment, no behavior issues.  Pt asked to keep his mask on while outside of his room, he was diagnosed with RSV pneumonia.  No sob noted, no fever.  Pt given Ativan 2mg  PO as prescribed for agitation protocol.     Pt denies verbal/emotional/physical or sexual abuse history. Denies AVH and is currently able to contract for safety. Multiple tatoo's observed to body, otherwise skin intact. Pt states that he just got medical insurance to start back on his medications.   Admission assessment and skin assessment complete, 15 minutes checks initiated,  Belongings listed and secured.  Treatment plan explained and pt. settled into the unit.

## 2022-03-25 NOTE — Plan of Care (Signed)
  Problem: Education: Goal: Knowledge of [facility] Education information/materials will improve Outcome: Progressing Goal: Verbalization of understanding the information provided will improve Outcome: Progressing   

## 2022-03-26 ENCOUNTER — Encounter (HOSPITAL_COMMUNITY): Payer: Self-pay

## 2022-03-26 DIAGNOSIS — F1090 Alcohol use, unspecified, uncomplicated: Secondary | ICD-10-CM | POA: Insufficient documentation

## 2022-03-26 DIAGNOSIS — F411 Generalized anxiety disorder: Secondary | ICD-10-CM | POA: Insufficient documentation

## 2022-03-26 DIAGNOSIS — F1120 Opioid dependence, uncomplicated: Secondary | ICD-10-CM

## 2022-03-26 DIAGNOSIS — F1320 Sedative, hypnotic or anxiolytic dependence, uncomplicated: Secondary | ICD-10-CM | POA: Insufficient documentation

## 2022-03-26 DIAGNOSIS — F4310 Post-traumatic stress disorder, unspecified: Secondary | ICD-10-CM | POA: Insufficient documentation

## 2022-03-26 DIAGNOSIS — F314 Bipolar disorder, current episode depressed, severe, without psychotic features: Principal | ICD-10-CM | POA: Insufficient documentation

## 2022-03-26 MED ORDER — DIPHENHYDRAMINE HCL 25 MG PO CAPS
50.0000 mg | ORAL_CAPSULE | Freq: Three times a day (TID) | ORAL | Status: DC | PRN
  Administered 2022-03-29 – 2022-03-31 (×3): 50 mg via ORAL

## 2022-03-26 MED ORDER — HALOPERIDOL 5 MG PO TABS: 5.0000 mg | ORAL_TABLET | Freq: Three times a day (TID) | ORAL | Status: DC | PRN

## 2022-03-26 MED ORDER — LORAZEPAM 0.5 MG PO TABS
0.5000 mg | ORAL_TABLET | Freq: Two times a day (BID) | ORAL | Status: AC
  Administered 2022-03-30 (×2): 0.5 mg via ORAL
  Filled 2022-03-26 (×2): qty 1

## 2022-03-26 MED ORDER — LORAZEPAM 1 MG PO TABS
1.0000 mg | ORAL_TABLET | Freq: Three times a day (TID) | ORAL | Status: AC
  Administered 2022-03-28 (×3): 1 mg via ORAL
  Filled 2022-03-26 (×2): qty 1

## 2022-03-26 MED ORDER — HALOPERIDOL LACTATE 5 MG/ML IJ SOLN: 5.0000 mg | Freq: Three times a day (TID) | INTRAMUSCULAR | Status: DC | PRN

## 2022-03-26 MED ORDER — LORAZEPAM 1 MG PO TABS
2.0000 mg | ORAL_TABLET | Freq: Three times a day (TID) | ORAL | Status: AC
  Administered 2022-03-26: 2 mg via ORAL
  Filled 2022-03-26: qty 2

## 2022-03-26 MED ORDER — LORAZEPAM 2 MG/ML IJ SOLN: 2.0000 mg | Freq: Three times a day (TID) | INTRAMUSCULAR | Status: DC | PRN

## 2022-03-26 MED ORDER — TRAZODONE HCL 50 MG PO TABS
50.0000 mg | ORAL_TABLET | Freq: Every evening | ORAL | Status: DC | PRN
  Filled 2022-03-26: qty 1

## 2022-03-26 MED ORDER — LORAZEPAM 1 MG PO TABS
2.0000 mg | ORAL_TABLET | Freq: Three times a day (TID) | ORAL | Status: DC | PRN
  Administered 2022-03-26: 2 mg via ORAL

## 2022-03-26 MED ORDER — DIPHENHYDRAMINE HCL 50 MG/ML IJ SOLN: 50.0000 mg | Freq: Three times a day (TID) | INTRAMUSCULAR | Status: DC | PRN

## 2022-03-26 MED ORDER — LORAZEPAM 0.5 MG PO TABS
0.5000 mg | ORAL_TABLET | Freq: Three times a day (TID) | ORAL | Status: AC
  Administered 2022-03-29 (×2): 0.5 mg via ORAL
  Filled 2022-03-26 (×3): qty 1

## 2022-03-26 MED ORDER — LORAZEPAM 0.5 MG PO TABS
0.5000 mg | ORAL_TABLET | Freq: Every day | ORAL | Status: AC
  Administered 2022-03-31 – 2022-04-01 (×2): 0.5 mg via ORAL
  Filled 2022-03-26 (×2): qty 1

## 2022-03-26 MED ORDER — LORAZEPAM 0.5 MG PO TABS
1.5000 mg | ORAL_TABLET | Freq: Three times a day (TID) | ORAL | Status: AC
  Administered 2022-03-27 (×2): 1.5 mg via ORAL
  Filled 2022-03-26 (×3): qty 3

## 2022-03-26 NOTE — Progress Notes (Signed)
Patient stayed in his room most of this evening, pt did not attend evening group. Patient denies SI,HI, and AVH. Patient stated "my mood is great." Patient only came out to the med window for his bedtime medications, tolerated scheduled meds, one cup of water without any issues. Fluid intake monitored as ordered. RN provided emotional support and encouragement. Q 15 minutes safety checks ongoing, patient verbally contracts for safety, patient is safe on the unit.

## 2022-03-26 NOTE — [facility] Group Notes (Signed)
Pt did not attend goals group. 
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
## 2022-03-26 NOTE — Progress Notes (Signed)
   03/26/22 0559  15 Minute Checks  Location Bedroom  Visual Appearance Calm  Behavior Composed  Sleep (Behavioral Health Patients Only)  Calculate sleep? (Click Yes once per 24 hr at 0600 safety check) Yes  Documented sleep last 24 hours 8.75

## 2022-03-26 NOTE — Progress Notes (Addendum)
D: Pt denied SI/HI/AVH this morning. Pt rated his depression a 6/10 and his anxiety a 6/10. Pt reports that he has been experiencing chills and hot flashes ever since he had cardiac issues in the ICU prior to coming to [facility]. Pt denies any withdrawal symptoms at this time. Pt has been pleasant, calm, and cooperative throughout the shift. Pt seen interacting well on the unit. Fluid intake monitored as ordered; pt has drank 1 cup of water, 1 cup of Dr. Malachi Bonds, and 1 cup of Ginger Ale. Pt refused his evening labs.  A: RN provided support and encouragement to patient. Pt given scheduled medications as prescribed. Fluid intake monitored as ordered. Q15 min checks verified for safety. Pt restricted to unit for meals as ordered due to RSV status.    R: Patient verbally contracts for safety. Patient compliant with medications and treatment plan. Patient is interacting well on the unit. Pt is safe on the unit.   03/26/22 1159  Psych Admission Type (Psych Patients Only)  Admission Status Involuntary  Psychosocial Assessment  Patient Complaints Depression;Anxiety  Eye Contact Fair  Facial Expression Sad  Affect Depressed  Speech Logical/coherent  Interaction Assertive  Motor Activity Other (Comment) (WDL)  Appearance/Hygiene Unremarkable  Behavior Characteristics Appropriate to situation;Calm  Mood Depressed;Anxious;Sad  Thought Process  Coherency WDL  Content WDL  Delusions None reported or observed  Perception WDL  Hallucination None reported or observed  Judgment Poor  Confusion None  Danger to Self  Current suicidal ideation? Denies  Danger to Others  Danger to Others None reported or observed
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
## 2022-03-26 NOTE — BHH Group Notes (Signed)
Pt did not attend AA group  

## 2022-03-26 NOTE — BH IP Treatment Plan (Signed)
Interdisciplinary Treatment and Diagnostic Plan Update  03/26/2022 Time of Session: 10:10am Jared [name] MRN: [id number]  Principal Diagnosis: Substance induced mood disorder (HCC)  Secondary Diagnoses: Principal Problem:   Substance induced mood disorder (HCC)   Current Medications:  Current Facility-Administered Medications  Medication Dose Route Frequency Provider Last Rate Last Admin   alum & mag hydroxide-simeth (MAALOX/MYLANTA) 200-200-20 MG/5ML suspension 30 mL  30 mL Oral Q4H PRN Carlyn Reichert, MD       cariprazine [name]) capsule 3 mg  3 mg Oral Daily Carlyn Reichert, MD   3 mg at 03/26/22 1109   dapagliflozin propanediol (FARXIGA) tablet 10 mg  10 mg Oral Daily Armandina Stammer I, NP   10 mg at 03/26/22 1109   diphenhydrAMINE (BENADRYL) capsule 50 mg  50 mg Oral Q6H PRN Carlyn Reichert, MD       gabapentin (NEURONTIN) capsule 300 mg  300 mg Oral TID Carlyn Reichert, MD   300 mg at 03/26/22 1146   hydrOXYzine (ATARAX) tablet 25 mg  25 mg Oral TID PRN Carlyn Reichert, MD       OLANZapine zydis (ZYPREXA) disintegrating tablet 5 mg  5 mg Oral Q8H PRN Carlyn Reichert, MD       And   LORazepam (ATIVAN) tablet 1 mg  1 mg Oral PRN Carlyn Reichert, MD       And   ziprasidone (GEODON) injection 20 mg  20 mg Intramuscular PRN Carlyn Reichert, MD       LORazepam (ATIVAN) tablet 2 mg  2 mg Oral TID Carlyn Reichert, MD   2 mg at 03/26/22 1146   magnesium hydroxide (MILK OF MAGNESIA) suspension 30 mL  30 mL Oral Daily PRN Carlyn Reichert, MD       metoprolol succinate (TOPROL-XL) 24 hr tablet 12.5 mg  12.5 mg Oral Daily Carlyn Reichert, MD   12.5 mg at 03/26/22 1109   sacubitril-valsartan (ENTRESTO) 24-26 mg per tablet  1 tablet Oral BID Carlyn Reichert, MD   1 tablet at 03/26/22 1109   spironolactone (ALDACTONE) tablet 12.5 mg  12.5 mg Oral Daily Carlyn Reichert, MD   12.5 mg at 03/26/22 1110   PTA Medications: Medications Prior to Admission  Medication Sig Dispense Refill Last Dose    alprazolam (XANAX) 2 MG tablet Take 2 mg by mouth in the morning, at noon, in the evening, and at bedtime.   Past Month   Multiple Vitamins-Minerals (MULTIVITAMIN WITH MINERALS) tablet Take 1 tablet by mouth daily.   Past Week   cariprazine (VRAYLAR) 3 MG capsule Take 1 capsule (3 mg total) by mouth daily. 30 capsule     dapagliflozin propanediol (FARXIGA) 10 MG TABS tablet Take 1 tablet (10 mg total) by mouth daily. 30 tablet     gabapentin (NEURONTIN) 300 MG capsule Take 1 capsule (300 mg total) by mouth 2 (two) times daily.      metoprolol succinate (TOPROL-XL) 25 MG 24 hr tablet Take 0.5 tablets (12.5 mg total) by mouth daily.      sacubitril-valsartan (ENTRESTO) 24-26 MG Take 1 tablet by mouth 2 (two) times daily. 60 tablet     spironolactone (ALDACTONE) 25 MG tablet Take 0.5 tablets (12.5 mg total) by mouth daily.      thiamine (VITAMIN B1) 100 MG tablet Take 1 tablet (100 mg total) by mouth daily. 30 tablet 0     Patient Stressors: Legal issue   Marital or family conflict   Medication change or noncompliance   Substance abuse  Patient Strengths: Average or above average intelligence  Capable of independent living  Communication skills  General fund of knowledge  Motivation for treatment/growth  Supportive family/friends   Treatment Modalities: Medication Management, Group therapy, Case management,  1 to 1 session with clinician, Psychoeducation, Recreational therapy.   Physician Treatment Plan for Primary Diagnosis: Substance induced mood disorder ([place]) Long Term Goal(s):     Short Term Goals:    Medication Management: Evaluate patient's response, side effects, and tolerance of medication regimen.  Therapeutic Interventions: 1 to 1 sessions, Unit Group sessions and Medication administration.  Evaluation of Outcomes: Progressing  Physician Treatment Plan for Secondary Diagnosis: Principal Problem:   Substance induced mood disorder ([place])  Long Term Goal(s):      Short Term Goals:       Medication Management: Evaluate patient's response, side effects, and tolerance of medication regimen.  Therapeutic Interventions: 1 to 1 sessions, Unit Group sessions and Medication administration.  Evaluation of Outcomes: Progressing   RN Treatment Plan for Primary Diagnosis: Substance induced mood disorder ([place]) Long Term Goal(s): Knowledge of disease and therapeutic regimen to maintain health will improve  Short Term Goals: Ability to remain free from injury will improve, Ability to verbalize frustration and anger appropriately will improve, Ability to demonstrate self-control, Ability to participate in decision making will improve, Ability to verbalize feelings will improve, Ability to disclose and discuss suicidal ideas, Ability to identify and develop effective coping behaviors will improve, and Compliance with prescribed medications will improve  Medication Management: RN will administer medications as ordered by provider, will assess and evaluate patient's response and provide education to patient for prescribed medication. RN will report any adverse and/or side effects to prescribing provider.  Therapeutic Interventions: 1 on 1 counseling sessions, Psychoeducation, Medication administration, Evaluate responses to treatment, Monitor vital signs and CBGs as ordered, Perform/monitor CIWA, COWS, AIMS and Fall Risk screenings as ordered, Perform wound care treatments as ordered.  Evaluation of Outcomes: Progressing   LCSW Treatment Plan for Primary Diagnosis: Substance induced mood disorder ([place]) Long Term Goal(s): Safe transition to appropriate next level of care at discharge, Engage patient in therapeutic group addressing interpersonal concerns.  Short Term Goals: Engage patient in aftercare planning with referrals and resources, Increase social support, Increase ability to appropriately verbalize feelings, Increase emotional regulation, Facilitate  acceptance of mental health diagnosis and concerns, Facilitate patient progression through stages of change regarding substance use diagnoses and concerns, Identify triggers associated with mental health/substance abuse issues, and Increase skills for wellness and recovery  Therapeutic Interventions: Assess for all discharge needs, 1 to 1 time with Social worker, Explore available resources and support systems, Assess for adequacy in community support network, Educate family and significant other(s) on suicide prevention, Complete Psychosocial Assessment, Interpersonal group therapy.  Evaluation of Outcomes: Progressing   Progress in Treatment: Attending groups: Yes. Participating in groups: Yes. Taking medication as prescribed: Yes. Toleration medication: Yes. Family/Significant other contact made: Yes, individual(s) contacted:  Lenell Antu [phone]  Patient understands diagnosis: Yes. Discussing patient identified problems/goals with staff: Yes. Medical problems stabilized or resolved: Yes. Denies suicidal/homicidal ideation: Yes. Issues/concerns per patient self-inventory: No.   New problem(s) identified: No, Describe:  none reported   New Short Term/Long Term Goal(s):   medication stabilization, elimination of SI thoughts, development of comprehensive mental wellness plan.     Patient Goals:  Pt states, "I want to gain something from being here to make me feel better about myself"  Discharge Plan or Barriers: Patient recently  admitted. CSW will continue to follow and assess for appropriate referrals and possible discharge planning.    Reason for Continuation of Hospitalization: Anxiety Depression Medication stabilization  Estimated Length of Stay: 3-5 days  Last 3 [place] Suicide Severity Risk Score: [place] Admission (Current) from 03/25/2022 in [facility] ED to Hosp-Admission (Discharged) from 03/16/2022 in [place] [facility]  C-SSRS RISK CATEGORY No Risk High Risk       Last PHQ 2/9 Scores:     No data to display          Scribe for Treatment Team: Zachery Conch, LCSW 03/26/2022 1:29 PM

## 2022-03-26 NOTE — BHH Suicide Risk Assessment (Signed)
[place] INPATIENT:  Family/Significant Other Suicide Prevention Education  Suicide Prevention Education: Lenell Antu Refusal to Support Patient after Discharge:  Suicide Prevention Education Not Provided:  Patient has identified home of family/significant other as the place the patient will be residing after discharge.  With written consent of the patient, two attempts were made to provide Suicide Prevention Education to Lenell Antu, (Mother) [phone].  This person indicates he/she will not be responsible for the patient after discharge.  Yaira Bernardi S Shonice Wrisley 03/26/2022,12:30 PM

## 2022-03-26 NOTE — BHH Suicide Risk Assessment (Addendum)
Suicide Risk Assessment  Admission Assessment    [facility] Admission Suicide Risk Assessment   Nursing information obtained from:     Demographic factors:  Male, Divorced or widowed, Caucasian  Current Mental Status:  Suicidal ideation indicated by others  Loss Factors:  Loss of significant relationship, Legal issues  Historical Factors:  Impulsivity, Prior suicide attempts  Risk Reduction Factors:  Sense of responsibility to family, Living with another person, especially a relative, Positive social support, Positive therapeutic relationship  Total Time spent with patient:  Greater than 30 minutes  Principal Problem: Substance induced mood disorder ([place])  Diagnosis:  Principal Problem:   Substance induced mood disorder ([place])  Subjective Data: See H&P.  Continued Clinical Symptoms:  Alcohol Use Disorder Identification Test Final Score (AUDIT): 3 The "Alcohol Use Disorders Identification Test", Guidelines for Use in Primary Care, Second Edition.  World Pharmacologist [facility]). Score between 0-7:  no or low risk or alcohol related problems. Score between 8-15:  moderate risk of alcohol related problems. Score between 16-19:  high risk of alcohol related problems. Score 20 or above:  warrants further diagnostic evaluation for alcohol dependence and treatment.  CLINICAL FACTORS:   Depression:   Impulsivity Insomnia Alcohol/Substance Abuse/Dependencies More than one psychiatric diagnosis Unstable or Poor Therapeutic Relationship Previous Psychiatric Diagnoses and Treatments  Musculoskeletal: Strength & Muscle Tone: within normal limits Gait & Station: normal Patient leans: N/A  Psychiatric Specialty Exam:  Presentation  General Appearance:  Casual; Fairly Groomed  Eye Contact: Good  Speech: Clear and Coherent; Normal Rate  Speech Volume: Normal  Handedness:Right   Mood and Affect  Mood: Depressed; Anxious  Affect: Congruent; Flat; Tearful  Thought  Process  Thought Processes: Coherent; Goal Directed  Descriptions of Associations:Intact  Orientation:Full (Time, Place and Person)  Thought Content:Logical  History of Schizophrenia/Schizoaffective disorder: NA  Duration of Psychotic Symptoms: Greater than two weeks.  Hallucinations:Hallucinations: None  Ideas of Reference:None  Suicidal Thoughts:Suicidal Thoughts: No  Homicidal Thoughts:Homicidal Thoughts: No HI Active Intent and/or Plan: Without Intent; Without Plan; Without Means to Carry Out; Without Access to Means  Sensorium  Memory: Immediate Good; Recent Good; Remote Good  Judgment: Fair  Insight: Fair  Executive Functions  Concentration: Good  Attention Span: Good  Recall: Good  Fund of Knowledge: Good  Language: Good  Psychomotor Activity  Psychomotor Activity:Psychomotor Activity: Normal   Assets  Assets: Communication Skills; Desire for Improvement; Physical Health; Resilience; Social Support  Sleep  Sleep:Sleep: Fair Number of Hours of Sleep: 5.5  Physical Exam: Physical Exam Vitals and nursing note reviewed.  HENT:     Head: Normocephalic.     Nose: Nose normal.     Mouth/Throat:     Pharynx: Oropharynx is clear.  Eyes:     Pupils: Pupils are equal, round, and reactive to light.  Cardiovascular:     Rate and Rhythm: Normal rate.     Pulses: Normal pulses.  Pulmonary:     Effort: Pulmonary effort is normal.  Genitourinary:    Comments: Deferred Musculoskeletal:        General: Normal range of motion.     Cervical back: Normal range of motion.  Skin:    General: Skin is warm and dry.  Neurological:     General: No focal deficit present.     Mental Status: He is alert and oriented to person, place, and time.    Review of Systems  Constitutional:  Negative for chills, diaphoresis and fever.  HENT:  Negative for congestion  and sore throat.   Eyes:  Negative for blurred vision.  Respiratory:  Negative for cough,  shortness of breath and wheezing.   Cardiovascular:  Negative for chest pain and palpitations.  Gastrointestinal:  Negative for abdominal pain, constipation, diarrhea, heartburn, nausea and vomiting.  Genitourinary:  Negative for dysuria.  Musculoskeletal:  Negative for joint pain and myalgias.  Neurological:  Negative for dizziness, tingling, tremors, sensory change, speech change, focal weakness, seizures, loss of consciousness, weakness and headaches.  Endo/Heme/Allergies:        Allergies: Tylenol.  Psychiatric/Behavioral:  Positive for depression and substance abuse (Hx. opioid, benzodiazepine & THC use disorder.). Negative for hallucinations, memory loss and suicidal ideas. The patient is nervous/anxious and has insomnia.    Blood pressure 106/82, pulse 93, temperature 99.1 F (37.3 C), temperature source Oral, resp. rate 20, height 6\' 1"  (1.854 m), weight 70.3 kg, SpO2 99 %. Body mass index is 20.45 kg/m.  COGNITIVE FEATURES THAT CONTRIBUTE TO RISK:  Closed-mindedness, Loss of executive function, Polarized thinking, and Thought constriction (tunnel vision)    SUICIDE RISK:   Moderate:  Frequent suicidal ideation with limited intensity, and duration, some specificity in terms of plans, no associated intent, good self-control, limited dysphoria/symptomatology, some risk factors present, and identifiable protective factors, including available and accessible social support.  PLAN OF CARE: See H&P.  I certify that inpatient services furnished can reasonably be expected to improve the patient's condition.   Lindell Spar, NP, pmhnp, fnp-bc 03/26/2022, 1:59 PM

## 2022-03-27 MED ORDER — LIDOCAINE 5 % EX PTCH
1.0000 | MEDICATED_PATCH | Freq: Every day | CUTANEOUS | Status: DC
  Administered 2022-03-27: 1 via TRANSDERMAL
  Filled 2022-03-27 (×7): qty 1

## 2022-03-27 MED ORDER — MENTHOL 3 MG MT LOZG
1.0000 | LOZENGE | OROMUCOSAL | Status: DC | PRN
  Administered 2022-03-28: 3 mg via ORAL

## 2022-03-27 MED ORDER — TRAZODONE HCL 100 MG PO TABS
100.0000 mg | ORAL_TABLET | Freq: Every day | ORAL | Status: DC
  Administered 2022-03-28 – 2022-03-31 (×4): 100 mg via ORAL
  Filled 2022-03-27 (×9): qty 1

## 2022-03-27 NOTE — Progress Notes (Signed)
[facility] MD Progress Note  03/27/2022 1:50 PM Jared [name]  MRN:  [id number] Principal Problem: Bipolar 1 disorder, depressed, severe ([place]) Diagnosis: Principal Problem:   Bipolar 1 disorder, depressed, severe ([place]) Active Problems:   Severe opioid use disorder ([place])   Psychoactive substance-induced mood disorder ([place])   Alcohol use disorder   Severe benzodiazepine use disorder ([place])   PTSD (post-traumatic stress disorder)   GAD (generalized anxiety disorder)  Reason For Admission: Jared [name] is a 42 year old Caucasian male with hx of stimulant (methamphetamine, opioid & alcohol use disorders including opioid. Patient is known in this [facility] from his previous admission in 2018 with similar complaints. He is admitted to the [facility] this time around with complaints of intentional suicide attempt by overdose on heroin/fentanyl, Xanax & alcohol. Patient apparently was found unconscious by his mother after he ingested the drugs. His mother called 911& he was taken to the [facility] ED where he was intubated. After medical stabilization & clearance, Jared [name] was transferred to the [facility] for further psychiatric evaluation & treatments. A review of his chart has shown that his UDS was positive for Barbiturates, benzodiazepine, opiates & THC.   24 hr chart review: V/S WNL in the past 24 hrs. Med is med compliant, required Hydroxyzine 25 mg last night for anxiety. Pt isolative to his room in last 24 hrs, coming out for meals and medications, and not attending unit group sessions as per staff reports.   Pt assessment note, 03/27/2021: Pt as assessed in his room while lying in bed. Pt presents with a flat affect and depressed mood. His attention to personal hygiene and grooming is fair, eye contact is good, speech is clear & coherent. Thought contents are organized and logical, and pt currently denies SI/HI/AVH or paranoia. There is no evidence of delusional thoughts.    Pt rates his mood today as 5 (10  being worst), he rates his anxiety level today as 7 (10 being worst). He reports back pain of 7 (10 being worst), states that Tylenol has not been helpful. He states that he fell a few years back, and back pain is chronic. He states that moving around helps the pain. He reports that he wants Ibuprofen, but this is not a good medication for him given that he is on fluids restrictions for CHF. We are ordering Lidoderm patch to help with back pain. He reports that he has a cough and is asking for cough drops which we are also ordering for him. He states that his cough is related to the  pneumonia that he has while hospitalized prior to this admission. He reports having a BM earlier today, denies medication related side effects. He reports a fair sleep quality last night, but states that he will benefit from an increase in his sleep medication. We are adding Trazodone 100 mg for insomnia and keeping the 50 mg as PRN. We are continuing other medications as listed below.  Total Time spent with patient: 45 minutes  Past Psychiatric History: As listed above  Past Medical History:  Past Medical History:  Diagnosis Date   Asthma    Gastric ulcer    Hepatitis C    Hypertension    History reviewed. No pertinent surgical history. Family History:  Family History  Problem Relation Age of Onset   Hypertension Father    Family Psychiatric  History: See H & P Social History:  Social History   Substance and Sexual Activity  Alcohol Use  Yes   Alcohol/week: 2.0 standard drinks of alcohol   Types: 2 Glasses of wine per week     Social History   Substance and Sexual Activity  Drug Use Yes   Types: IV, "Crack" cocaine, Cocaine, Fentanyl, Heroin, MDMA (Ecstacy)   Comment: Polysubstance Use hx. Pt reports more recent has just been Cocaine and Heroin.    Social History   Socioeconomic History   Marital status: Single    Spouse name: Not on file   Number of children: 0   Years of education: Not on file    Highest education level: Not on file  Occupational History   Not on file  Tobacco Use   Smoking status: Former    Types: Cigarettes   Smokeless tobacco: Never  Vaping Use   Vaping Use: Former  Substance and Sexual Activity   Alcohol use: Yes    Alcohol/week: 2.0 standard drinks of alcohol    Types: 2 Glasses of wine per week   Drug use: Yes    Types: IV, "Crack" cocaine, Cocaine, Fentanyl, Heroin, MDMA (Ecstacy)    Comment: Polysubstance Use hx. Pt reports more recent has just been Cocaine and Heroin.   Sexual activity: Not Currently  Other Topics Concern   Not on file  Social History Narrative   Not on file   Social Determinants of Health   Financial Resource Strain: Low Risk  (03/23/2022)   Overall Financial Resource Strain (CARDIA)    Difficulty of Paying Living Expenses: Not very hard  Food Insecurity: No Food Insecurity (03/25/2022)   Hunger Vital Sign    Worried About Running Out of Food in the Last Year: Never true    Ran Out of Food in the Last Year: Never true  Transportation Needs: No Transportation Needs (03/25/2022)   PRAPARE - Administrator, Civil Service (Medical): No    Lack of Transportation (Non-Medical): No  Physical Activity: Not on file  Stress: Not on file  Social Connections: Not on file   Sleep: Fair  Appetite:  Good  Current Medications: Current Facility-Administered Medications  Medication Dose Route Frequency Provider Last Rate Last Admin   alum & mag hydroxide-simeth (MAALOX/MYLANTA) 200-200-20 MG/5ML suspension 30 mL  30 mL Oral Q4H PRN Carlyn Reichert, MD       cariprazine [name]) capsule 3 mg  3 mg Oral Daily Carlyn Reichert, MD   3 mg at 03/27/22 0830   dapagliflozin propanediol (FARXIGA) tablet 10 mg  10 mg Oral Daily Armandina Stammer I, NP   10 mg at 03/27/22 3825   diphenhydrAMINE (BENADRYL) capsule 50 mg  50 mg Oral Q6H PRN Carlyn Reichert, MD       haloperidol (HALDOL) tablet 5 mg  5 mg Oral TID PRN Phineas Inches,  MD       And   LORazepam (ATIVAN) tablet 2 mg  2 mg Oral TID PRN Phineas Inches, MD       And   diphenhydrAMINE (BENADRYL) capsule 50 mg  50 mg Oral TID PRN Massengill, Harrold Donath, MD       haloperidol lactate (HALDOL) injection 5 mg  5 mg Intramuscular TID PRN Massengill, Harrold Donath, MD       And   LORazepam (ATIVAN) injection 2 mg  2 mg Intramuscular TID PRN Massengill, Harrold Donath, MD       And   diphenhydrAMINE (BENADRYL) injection 50 mg  50 mg Intramuscular TID PRN Massengill, Harrold Donath, MD       gabapentin (NEURONTIN) capsule 300  mg  300 mg Oral TID Corky Sox, MD   300 mg at 03/27/22 1146   hydrOXYzine (ATARAX) tablet 25 mg  25 mg Oral TID PRN Corky Sox, MD   25 mg at 03/26/22 2146   lidocaine (LIDODERM) 5 % 1 patch  1 patch Transdermal Daily Massengill, Ovid Curd, MD   1 patch at 03/27/22 1329   LORazepam (ATIVAN) tablet 1.5 mg  1.5 mg Oral Q8H Massengill, Nathan, MD   1.5 mg at 03/27/22 1328   Followed by   Derrill Memo ON 03/28/2022] LORazepam (ATIVAN) tablet 1 mg  1 mg Oral Q8H Massengill, Nathan, MD       Followed by   Derrill Memo ON 03/29/2022] LORazepam (ATIVAN) tablet 0.5 mg  0.5 mg Oral Q8H Massengill, Nathan, MD       Followed by   Derrill Memo ON 03/30/2022] LORazepam (ATIVAN) tablet 0.5 mg  0.5 mg Oral Q12H Massengill, Nathan, MD       Followed by   Derrill Memo ON 03/31/2022] LORazepam (ATIVAN) tablet 0.5 mg  0.5 mg Oral Daily Massengill, Ovid Curd, MD       magnesium hydroxide (MILK OF MAGNESIA) suspension 30 mL  30 mL Oral Daily PRN Corky Sox, MD       menthol-cetylpyridinium (CEPACOL) lozenge 3 mg  1 lozenge Oral PRN Massengill, Ovid Curd, MD       metoprolol succinate (TOPROL-XL) 24 hr tablet 12.5 mg  12.5 mg Oral Daily Corky Sox, MD   12.5 mg at 03/27/22 9735   sacubitril-valsartan (ENTRESTO) 24-26 mg per tablet  1 tablet Oral BID Corky Sox, MD   1 tablet at 03/27/22 3299   spironolactone (ALDACTONE) tablet 12.5 mg  12.5 mg Oral Daily Corky Sox, MD   12.5 mg at 03/27/22 2426    traZODone (DESYREL) tablet 100 mg  100 mg Oral QHS Massengill, Ovid Curd, MD       traZODone (DESYREL) tablet 50 mg  50 mg Oral QHS PRN Massengill, Ovid Curd, MD        Lab Results: No results found for this or any previous visit (from the past 48 hour(s)).  Blood Alcohol level:  Lab Results  Component Value Date   ETH <10 03/16/2022   ETH <10 83/41/9622   Metabolic Disorder Labs: No results found for: "HGBA1C", "MPG" No results found for: "PROLACTIN" No results found for: "CHOL", "TRIG", "HDL", "CHOLHDL", "VLDL", "LDLCALC"  Physical Findings: AIMS:0 CIWA:    COWS:     Musculoskeletal: Strength & Muscle Tone: within normal limits Gait & Station: normal Patient leans: N/A  Psychiatric Specialty Exam:  Presentation  General Appearance:  Appropriate for Environment; Fairly Groomed  Eye Contact: Fair  Speech: Clear and Coherent  Speech Volume: Normal  Handedness: Right  Mood and Affect  Mood: Euthymic  Affect: Congruent  Thought Process  Thought Processes: Coherent  Descriptions of Associations:Intact  Orientation:Full (Time, Place and Person)  Thought Content:Logical  History of Schizophrenia/Schizoaffective disorder:No data recorded Duration of Psychotic Symptoms:No data recorded Hallucinations:Hallucinations: None  Ideas of Reference:None  Suicidal Thoughts:Suicidal Thoughts: No  Homicidal Thoughts:Homicidal Thoughts: No HI Active Intent and/or Plan: Without Intent; Without Plan; Without Means to Carry Out; Without Access to Means   Sensorium  Memory: Immediate Good  Judgment: Fair  Insight: Fair  Community education officer  Concentration: Fair  Attention Span: Fair  Recall: Smiley Houseman of Knowledge: Fair  Language: Fair  Psychomotor Activity  Psychomotor Activity: Psychomotor Activity: Normal   Assets  Assets: Resilience   Sleep  Sleep: Sleep: [place] Number of  Hours of Sleep: 5.5    Physical Exam: Physical  Exam Constitutional:      Appearance: Normal appearance.  HENT:     Nose: Nose normal. No congestion or rhinorrhea.  Eyes:     Pupils: Pupils are equal, round, and reactive to light.  Musculoskeletal:        General: Normal range of motion.     Cervical back: Normal range of motion.  Neurological:     Mental Status: He is alert and oriented to person, place, and time.     Sensory: No sensory deficit.    Review of Systems  Constitutional:  Negative for fever.  HENT: Negative.    Eyes: Negative.   Respiratory: Negative.    Cardiovascular: Negative.   Gastrointestinal: Negative.   Genitourinary: Negative.   Musculoskeletal: Negative.   Skin: Negative.   Neurological: Negative.   Psychiatric/Behavioral:  Positive for depression and substance abuse. Negative for hallucinations, memory loss and suicidal ideas. The patient is nervous/anxious and has insomnia.    Blood pressure 105/76, pulse 94, temperature 98 F (36.7 C), temperature source Oral, resp. rate 16, height 6\' 1"  (1.854 m), weight 70.3 kg, SpO2 100 %. Body mass index is 20.45 kg/m.   Treatment Plan Summary:  Treatment Plan Summary: Daily contact with patient to assess and evaluate symptoms and progress in treatment and Medication management  Observation Level/Precautions:  15 minute checks  Laboratory:  Labs reviewed   Psychotherapy:  Unit Group sessions  Medications:  See [facility]  Consultations:  To be determined   Discharge Concerns:  Safety, medication compliance, mood stability  Estimated LOS: 5-7 days  Other:  N/A   Labs Reviewed on 03/27/21: TSH, HA1C & Lipid panel pending. BMP, CBC, UA reviewed. WBC-13.2, but this has reduced significantly as compared to one from 8 days ago. Tox screen +Opiates, +Benzos, +THC, +Barbs. EKG from 03/16/22 with Qtc-415.  PLAN Safety and Monitoring: Voluntary admission to inpatient psychiatric unit for safety, stabilization and treatment Daily contact with patient to assess and  evaluate symptoms and progress in treatment Patient's case to be discussed in multi-disciplinary team meeting Observation Level : q15 minute checks Vital signs: q12 hours Precautions:Safety  Long Term Goal(s): Improvement in symptoms so as ready for discharge  Short Term Goals: Ability to verbalize feelings will improve, Ability to disclose and discuss suicidal ideas, Ability to demonstrate self-control will improve, Ability to identify and develop effective coping behaviors will improve, Compliance with prescribed medications will improve, and Ability to identify triggers associated with substance abuse/mental health issues will improve  Diagnoses:  Principal Problem:   Bipolar 1 disorder, depressed, severe ([facility]) Active Problems:   Severe opioid use disorder ([facility])   Psychoactive substance-induced mood disorder ([facility])   Alcohol use disorder   Severe benzodiazepine use disorder ([facility])   PTSD (post-traumatic stress disorder)   GAD (generalized anxiety disorder)  Medications -Continue Vrayalar 3 mg po daily for mood control. -Continue gabapentin 300 mg po tid for anxiety/substance withdrawal syndrome. -Continue Vistaril 25 mg po tid prn for anxiety.  -Continue Trazodone 50 mg po Q hs prn for insomnia. -Start Trazodone 100 mg nightly for insomnia   Benzodiazepine taper for benzodiazepine withdrawal management: -Continue Ativan 2 mg po Q 8 hrs x 1 dose (03-26-22). -Continue Ativan 1.5 mg po Q 8 hrs x 3 dose (03-27-22). -Continue Ativan 1 mg po Q 8 hrs x 3 doses (03-28-22).  -Continue Ativan 0.5 mg po Q 8 hrs x 3 doses (03-29-22).  -Continue Ativan 0.5  mg po Q 12 hrs x 2 doses (03-30-22).  -Continue Ativan 0.5 mg po Q daily x 2 doses (03-31-22).   Other medical issues. -Continue Farxiga 10 mg po daily for DM.  -Continue Metoprolol 12.5 mg po daily for HTN/cardiac issues.  -Continue Aldactone 12.5 mg po daily for HTN.  -Continue Entresto 24-26 po bid for HTN. -Start Cepacol Lozenges  PRN for cough -Start Lidoderm patch for back pain    Agitation protocols. -Continue as recommended (see MAR).  -Continue Hydroxyzine 25 mg every 6 hours PRN  Other PRNS -Continue Tylenol 650 mg every 6 hours PRN for mild pain -Continue Maalox 30 mg every 4 hrs PRN for indigestion -Continue Imodium 2-4 mg as needed for diarrhea -Continue Milk of Magnesia as needed every 6 hrs for constipation -Continue Zofran disintegrating tabs every 6 hrs PRN for nausea   Discharge Planning: Social work and case management to assist with discharge planning and identification of hospital follow-up needs prior to discharge Estimated LOS: 5-7 days Discharge Concerns: Need to establish a safety plan; Medication compliance and effectiveness Discharge Goals: Return home with outpatient referrals for mental health follow-up including medication management/psychotherapy  I certify that inpatient services furnished can reasonably be expected to improve the patient's condition.    Starleen Blue, NP 1/2/20241:50 PM

## 2022-03-27 NOTE — Progress Notes (Signed)
   03/27/22 0644  15 Minute Checks  Location Bedroom  Visual Appearance Calm  Behavior Composed  Sleep (Behavioral Health Patients Only)  Calculate sleep? (Click Yes once per 24 hr at 0600 safety check) Yes  Documented sleep last 24 hours 7.5

## 2022-03-27 NOTE — Progress Notes (Signed)
Adult Psychoeducational Group Note  Date:  03/27/2022 Time:  9:06 PM  Group Topic/Focus:  Wrap-Up Group:   The focus of this group is to help patients review their daily goal of treatment and discuss progress on daily workbooks.  Participation Level:  None  Participation Quality:  None  Affect:  n/a  Cognitive:  n/a  Insight: None  Engagement in Group:  None  Modes of Intervention:  Discussion  Additional Comments:  Pt was encouraged to attend group but refused   Gerhard Perches 03/27/2022, 9:06 PM

## 2022-03-27 NOTE — Plan of Care (Signed)
  Problem: Education: Goal: Mental status will improve Outcome: Progressing   Problem: Activity: Goal: Interest or engagement in activities will improve Outcome: Progressing   Problem: Coping: Goal: Ability to verbalize frustrations and anger appropriately will improve Outcome: Progressing   Problem: Education: Goal: Ability to make informed decisions regarding treatment will improve Outcome: Progressing   Problem: Self-Concept: Goal: Will verbalize positive feelings about self Outcome: Progressing

## 2022-03-27 NOTE — Progress Notes (Addendum)
D: Pt denied SI/HI/AVH this morning. Pt denied feelings of depression or anxiety this morning during medication pass. Pt remained isolated in room for a majority of the day. Pt reports that he slept "ok" last night. Pt remains in room for meals as ordered by MD due to unknown RSV status. Pt has been pleasant, calm, and cooperative throughout the shift.   A: RN provided support and encouragement to patient. Pt given scheduled medications as prescribed. Q15 min checks verified for safety.    R: Patient verbally contracts for safety. Patient compliant with medications and treatment plan. Patient is interacting well on the unit. Pt is safe on the unit.   03/27/22 0900  Psych Admission Type (Psych Patients Only)  Admission Status Involuntary  Psychosocial Assessment  Patient Complaints None  Eye Contact Fair  Facial Expression Flat  Affect Depressed  Speech Logical/coherent  Interaction Minimal  Motor Activity Other (Comment) (WDL)  Appearance/Hygiene Unremarkable  Behavior Characteristics Appropriate to situation;Calm  Mood Depressed  Thought Process  Coherency WDL  Content WDL  Delusions None reported or observed  Perception WDL  Hallucination None reported or observed  Judgment Impaired  Confusion None  Danger to Self  Current suicidal ideation? Denies  Self-Injurious Behavior No self-injurious ideation or behavior indicators observed or expressed   Danger to Others  Danger to Others None reported or observed

## 2022-03-27 NOTE — BHH Group Notes (Signed)
Pt did not attend group. 

## 2022-03-27 NOTE — Group Note (Signed)
LCSW Group Therapy Note   Group Date: 03/27/2022 Start Time: 1100 End Time: 1200   Type of Therapy and Topic:  Group Therapy: Boundaries  Participation Level:  Did Not Attend  Description of Group: This group will address the use of boundaries in their personal lives. Patients will explore why boundaries are important, the difference between healthy and unhealthy boundaries, and negative and postive outcomes of different boundaries and will look at how boundaries can be crossed.  Patients will be encouraged to identify current boundaries in their own lives and identify what kind of boundary is being set. Facilitators will guide patients in utilizing problem-solving interventions to address and correct types boundaries being used and to address when no boundary is being used. Understanding and applying boundaries will be explored and addressed for obtaining and maintaining a balanced life. Patients will be encouraged to explore ways to assertively make their boundaries and needs known to significant others in their lives, using other group members and facilitator for role play, support, and feedback.  Therapeutic Goals:  1.  Patient will identify areas in their life where setting clear boundaries could be  used to improve their life.  2.  Patient will identify signs/triggers that a boundary is not being respected. 3.  Patient will identify two ways to set boundaries in order to achieve balance in  their lives: 4.  Patient will demonstrate ability to communicate their needs and set boundaries  through discussion and/or role plays   Therapeutic Modalities:   Cognitive Behavioral Therapy Solution-Focused Therapy  Windle Guard, LCSW 03/27/2022  3:24 PM

## 2022-03-28 NOTE — Progress Notes (Signed)
Psychoeducational Group Note  Date:  03/28/2022 Time:  2130  Group Topic/Focus:  Relapse Prevention Planning:   The focus of this group is to define relapse and discuss the need for planning to combat relapse.  Participation Level: Did Not Attend  Participation Quality:  Not Applicable  Affect:  Not Applicable  Cognitive:  Not Applicable  Insight:  Not Applicable  Engagement in Group: Not Applicable  Additional Comments:  The patient did not attend the evening N.A. meeting.   Ryu Cerreta S 03/28/2022, 9:30 PM

## 2022-03-28 NOTE — Progress Notes (Addendum)
   03/27/22 2112  Vitals  BP 120/77  MAP (mmHg) 88  BP Method Automatic  Pulse Rate 96  Pulse Rate Source Monitor  Oxygen Therapy  SpO2 100 %   BP within normal limits. Patient denies lightheadedness, dizziness, sob and associated cardiac symptoms. Patient appears to be in no acute distress, respirations are even and unlabored. Patient provided with gatorade and encouraged to drink. Patient educated on fall prevention and safety. Hold scheduled lorazepam, per Acquanetta Belling, NP.

## 2022-03-28 NOTE — Group Note (Signed)
Recreation Therapy Group Note   Group Topic:Other  Group Date: 03/28/2022 Start Time: 1400 End Time: 1440 Facilitators: Mishka Stegemann-McCall, LRT,CTRS Location: [address] Dayroom   Goal Area(s) Addresses:  Patient will engage in pro-social way in music group.  Patient will follow directions of drum leader on the first prompt. Patient will demonstrate no behavioral issues during group.  Patient will identify if a reduction in stress level occurs as a result of participation in therapeutic drum circle.    Activity Description/Intervention: Therapeutic Drumming. Patients with peers and staff were given the opportunity to engage in a leader facilitated [place] with staff from the [employer], in partnership with [employer]. Nurse, adult and trained [employer], Devin Going leading with LRT observing and documenting intervention and pt response. This evidenced-based practice targets 7 areas of health and wellbeing in the human experience including: stress-reduction, exercise, self-expression, camaraderie/support, nurturing, spirituality, and music-making (leisure).    Affect/Mood: N/A   Participation Level: Did not attend    Clinical Observations/Individualized Feedback:     Plan: Continue to engage patient in RT group sessions 2-3x/week.   Daelen Belvedere-McCall, LRT,CTRS 03/28/2022 3:33 PM

## 2022-03-28 NOTE — Progress Notes (Signed)
   03/27/22 2048  Vitals  BP (!) 110/52  MAP (mmHg) 68  BP Location Left Arm  BP Method Automatic  Patient Position (if appropriate) Lying  Pulse Rate 78   Patient diastolic BP low. Patient denies lightheadedness, dizziness, sob and associated cardiac symptoms at this time. Patient appears to be in no acute distress, respirations are even and unlabored. Patient provided with gatorade and encouraged to drink. Patient educated on fall prevention and safety.

## 2022-03-28 NOTE — Progress Notes (Signed)
   03/28/22 2300  Psych Admission Type (Psych Patients Only)  Admission Status Involuntary  Psychosocial Assessment  Patient Complaints None  Eye Contact Fair  Facial Expression Flat  Affect Depressed;Sad  Speech Logical/coherent  Interaction Minimal  Motor Activity Slow  Appearance/Hygiene Unremarkable  Behavior Characteristics Cooperative;Appropriate to situation;Calm  Mood Sad;Depressed  Thought Process  Coherency WDL  Content WDL  Delusions None reported or observed  Perception WDL  Hallucination None reported or observed  Judgment Limited  Confusion None  Danger to Self  Current suicidal ideation? Denies  Self-Injurious Behavior No self-injurious ideation or behavior indicators observed or expressed   Agreement Not to Harm Self Yes  Description of Agreement Verbal  Danger to Others  Danger to Others None reported or observed

## 2022-03-28 NOTE — Progress Notes (Signed)
[facility] MD Progress Note  03/28/2022 3:41 PM Jared [name]  MRN:  [id number] Principal Problem: Bipolar 1 disorder, depressed, severe ([place]) Diagnosis: Principal Problem:   Bipolar 1 disorder, depressed, severe ([place]) Active Problems:   Severe opioid use disorder ([place])   Psychoactive substance-induced mood disorder ([place])   Alcohol use disorder   Severe benzodiazepine use disorder ([place])   PTSD (post-traumatic stress disorder)   GAD (generalized anxiety disorder)  Reason For Admission: Jared [name]. Jared [name] is a 42 year old Caucasian male with hx of stimulant (methamphetamine, opioid & alcohol use disorders including opioid. Patient is known in this [facility] from his previous admission in 2018 with similar complaints. He is admitted to the [facility] [place] this time around with complaints of intentional suicide attempt by overdose on heroin/fentanyl, Xanax & alcohol. Patient apparently was found unconscious by his mother after he ingested the drugs. His mother called 911& he was taken to the [facility] ED where he was intubated. After medical stabilization & clearance, Jared [name] was transferred to the [facility] for further psychiatric evaluation & treatments. A review of his chart has shown that his UDS was positive for Barbiturates, benzodiazepine, opiates & THC.   24 hr chart review: V/S remain WNL in the past 24 hrs. pt took all scheduled meds last night with the exception of his scheduled dose of Trazodone 100 mg which he refused. No acute issues noted per chart review of past 24 hrs.   Pt assessment note, 03/28/2021: Mood today is less depressed, affect is congruent. Pt's attention to personal hygiene and grooming is fair, eye contact is good, speech is clear & coherent. Thought contents are organized and logical, and pt currently denies SI/HI/AVH or paranoia. There is no evidence of delusional thoughts.  He denies being in any physical pain today, and reports a good appetite.  Pt reports a poor sleep quality last  night, and has been educated that he needs to take the Trazodone that is scheduled for him for insomnia. He verbalized understanding. Ativan taper remains in place, and plan is to consider discharge once taper is completed. Pt reports that he is tolerating current medications well, denies medication related side effects. We are continuing medications as listed below with no changes being made today. We will continue to monitor on current meds.  Total Time spent with patient: 45 minutes  Past Psychiatric History: As listed above  Past Medical History:  Past Medical History:  Diagnosis Date   Asthma    Gastric ulcer    Hepatitis C    Hypertension    History reviewed. No pertinent surgical history. Family History:  Family History  Problem Relation Age of Onset   Hypertension Father    Family Psychiatric  History: See H & P Social History:  Social History   Substance and Sexual Activity  Alcohol Use Yes   Alcohol/week: 2.0 standard drinks of alcohol   Types: 2 Glasses of wine per week     Social History   Substance and Sexual Activity  Drug Use Yes   Types: IV, "Crack" cocaine, Cocaine, Fentanyl, Heroin, MDMA (Ecstacy)   Comment: Polysubstance Use hx. Pt reports more recent has just been Cocaine and Heroin.    Social History   Socioeconomic History   Marital status: Single    Spouse name: Not on file   Number of children: 0   Years of education: Not on file   Highest education level: Not on file  Occupational History   Not on file  Tobacco Use   Smoking status: Former    Types: Cigarettes   Smokeless tobacco: Never  Vaping Use   Vaping Use: Former  Substance and Sexual Activity   Alcohol use: Yes    Alcohol/week: 2.0 standard drinks of alcohol    Types: 2 Glasses of wine per week   Drug use: Yes    Types: IV, "Crack" cocaine, Cocaine, Fentanyl, Heroin, MDMA (Ecstacy)    Comment: Polysubstance Use hx. Pt reports more recent has just been Cocaine and Heroin.    Sexual activity: Not Currently  Other Topics Concern   Not on file  Social History Narrative   Not on file   Social Determinants of Health   Financial Resource Strain: Low Risk  (03/23/2022)   Overall Financial Resource Strain (CARDIA)    Difficulty of Paying Living Expenses: Not very hard  Food Insecurity: No Food Insecurity (03/25/2022)   Hunger Vital Sign    Worried About Running Out of Food in the Last Year: Never true    Ran Out of Food in the Last Year: Never true  Transportation Needs: No Transportation Needs (03/25/2022)   PRAPARE - Hydrologist (Medical): No    Lack of Transportation (Non-Medical): No  Physical Activity: Not on file  Stress: Not on file  Social Connections: Not on file   Sleep: Fair  Appetite:  Good  Current Medications: Current Facility-Administered Medications  Medication Dose Route Frequency Provider Last Rate Last Admin   alum & mag hydroxide-simeth (MAALOX/MYLANTA) 200-200-20 MG/5ML suspension 30 mL  30 mL Oral Q4H PRN Corky Sox, MD       cariprazine Arman Filter) capsule 3 mg  3 mg Oral Daily Corky Sox, MD   3 mg at 03/28/22 1610   dapagliflozin propanediol (FARXIGA) tablet 10 mg  10 mg Oral Daily Lindell Spar I, NP   10 mg at 03/28/22 9604   diphenhydrAMINE (BENADRYL) capsule 50 mg  50 mg Oral Q6H PRN Corky Sox, MD       haloperidol (HALDOL) tablet 5 mg  5 mg Oral TID PRN Janine Limbo, MD       And   LORazepam (ATIVAN) tablet 2 mg  2 mg Oral TID PRN Janine Limbo, MD       And   diphenhydrAMINE (BENADRYL) capsule 50 mg  50 mg Oral TID PRN Massengill, Ovid Curd, MD       haloperidol lactate (HALDOL) injection 5 mg  5 mg Intramuscular TID PRN Massengill, Ovid Curd, MD       And   LORazepam (ATIVAN) injection 2 mg  2 mg Intramuscular TID PRN Massengill, Ovid Curd, MD       And   diphenhydrAMINE (BENADRYL) injection 50 mg  50 mg Intramuscular TID PRN Massengill, Ovid Curd, MD       gabapentin (NEURONTIN)  capsule 300 mg  300 mg Oral TID Corky Sox, MD   300 mg at 03/28/22 1127   hydrOXYzine (ATARAX) tablet 25 mg  25 mg Oral TID PRN Corky Sox, MD   25 mg at 03/27/22 1653   lidocaine (LIDODERM) 5 % 1 patch  1 patch Transdermal Daily Massengill, Nathan, MD   1 patch at 03/27/22 1329   LORazepam (ATIVAN) tablet 1 mg  1 mg Oral Q8H Massengill, Nathan, MD   1 mg at 03/28/22 1430   Followed by   Derrill Memo ON 03/29/2022] LORazepam (ATIVAN) tablet 0.5 mg  0.5 mg Oral Q8H Massengill, Ovid Curd, MD       Followed by   [  START ON 03/30/2022] LORazepam (ATIVAN) tablet 0.5 mg  0.5 mg Oral Q12H Massengill, Nathan, MD       Followed by   Derrill Memo ON 03/31/2022] LORazepam (ATIVAN) tablet 0.5 mg  0.5 mg Oral Daily Massengill, Nathan, MD       magnesium hydroxide (MILK OF MAGNESIA) suspension 30 mL  30 mL Oral Daily PRN Corky Sox, MD       menthol-cetylpyridinium (CEPACOL) lozenge 3 mg  1 lozenge Oral PRN Janine Limbo, MD   3 mg at 03/28/22 1128   metoprolol succinate (TOPROL-XL) 24 hr tablet 12.5 mg  12.5 mg Oral Daily Corky Sox, MD   12.5 mg at 03/28/22 [id number]   sacubitril-valsartan (ENTRESTO) 24-26 mg per tablet  1 tablet Oral BID Corky Sox, MD   1 tablet at 03/28/22 [id number]   spironolactone (ALDACTONE) tablet 12.5 mg  12.5 mg Oral Daily Corky Sox, MD   12.5 mg at 03/28/22 0739   traZODone (DESYREL) tablet 100 mg  100 mg Oral QHS Massengill, Ovid Curd, MD       traZODone (DESYREL) tablet 50 mg  50 mg Oral QHS PRN Massengill, Ovid Curd, MD        Lab Results: No results found for this or any previous visit (from the past 48 hour(s)).  Blood Alcohol level:  Lab Results  Component Value Date   ETH <10 03/16/2022   ETH <10 [id number]   Metabolic Disorder Labs: No results found for: "HGBA1C", "MPG" No results found for: "PROLACTIN" No results found for: "CHOL", "TRIG", "HDL", "CHOLHDL", "VLDL", "LDLCALC"  Physical Findings: AIMS:0 CIWA:    COWS:     Musculoskeletal: Strength & Muscle  Tone: within normal limits Gait & Station: normal Patient leans: N/A  Psychiatric Specialty Exam:  Presentation  General Appearance:  Appropriate for Environment; Fairly Groomed  Eye Contact: Good  Speech: Clear and Coherent  Speech Volume: Normal  Handedness: Right  Mood and Affect  Mood: Depressed  Affect: Congruent  Thought Process  Thought Processes: Coherent  Descriptions of Associations:Intact  Orientation:Full (Time, Place and Person)  Thought Content:Logical  History of Schizophrenia/Schizoaffective disorder:No data recorded Duration of Psychotic Symptoms:No data recorded Hallucinations:Hallucinations: None  Ideas of Reference:None  Suicidal Thoughts:Suicidal Thoughts: No  Homicidal Thoughts:Homicidal Thoughts: No   Sensorium  Memory: Immediate Good  Judgment: Fair  Insight: Fair  Materials engineer: Fair  Attention Span: Fair  Recall: [place] of Knowledge: Fair  Language: Fair  Psychomotor Activity  Psychomotor Activity: Psychomotor Activity: Normal   Assets  Assets: Communication Skills; Social Support   Sleep  Sleep: Sleep: Poor    Physical Exam: Physical Exam Constitutional:      Appearance: Normal appearance.  HENT:     Nose: Nose normal. No congestion or rhinorrhea.  Eyes:     Pupils: Pupils are equal, round, and reactive to light.  Musculoskeletal:        General: Normal range of motion.     Cervical back: Normal range of motion.  Neurological:     Mental Status: He is alert and oriented to person, place, and time.     Sensory: No sensory deficit.    Review of Systems  Constitutional:  Negative for fever.  HENT: Negative.    Eyes: Negative.   Respiratory: Negative.    Cardiovascular: Negative.   Gastrointestinal: Negative.   Genitourinary: Negative.   Musculoskeletal: Negative.   Skin: Negative.   Neurological: Negative.   Psychiatric/Behavioral:  Positive for  depression and substance abuse. Negative for  hallucinations, memory loss and suicidal ideas. The patient is nervous/anxious and has insomnia.    Blood pressure 129/76, pulse 83, temperature 97.8 F (36.6 C), temperature source Oral, resp. rate 16, height 6\' 1"  (1.854 m), weight 70.3 kg, SpO2 100 %. Body mass index is 20.45 kg/m.   Treatment Plan Summary:  Treatment Plan Summary: Daily contact with patient to assess and evaluate symptoms and progress in treatment and Medication management  Observation Level/Precautions:  15 minute checks  Laboratory:  Labs reviewed   Psychotherapy:  Unit Group sessions  Medications:  See [facility]  Consultations:  To be determined   Discharge Concerns:  Safety, medication compliance, mood stability  Estimated LOS: 5-7 days  Other:  N/A   Labs Reviewed on 03/27/21: TSH, HA1C & Lipid panel pending. BMP, CBC, UA reviewed. WBC-13.2, but this has reduced significantly as compared to one from 8 days ago. Tox screen +Opiates, +Benzos, +THC, +Barbs. EKG from 03/16/22 with Qtc-415.  PLAN Safety and Monitoring: Voluntary admission to inpatient psychiatric unit for safety, stabilization and treatment Daily contact with patient to assess and evaluate symptoms and progress in treatment Patient's case to be discussed in multi-disciplinary team meeting Observation Level : q15 minute checks Vital signs: q12 hours Precautions:Safety  Long Term Goal(s): Improvement in symptoms so as ready for discharge  Short Term Goals: Ability to verbalize feelings will improve, Ability to disclose and discuss suicidal ideas, Ability to demonstrate self-control will improve, Ability to identify and develop effective coping behaviors will improve, Compliance with prescribed medications will improve, and Ability to identify triggers associated with substance abuse/mental health issues will improve  Diagnoses:  Principal Problem:   Bipolar 1 disorder, depressed, severe ([facility]) Active  Problems:   Severe opioid use disorder ([facility])   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 -Continue gabapentin 300 mg po tid for anxiety/substance withdrawal syndrome. -Continue Vistaril 25 mg po tid prn for anxiety.  -Continue Trazodone 50 mg po Q hs prn for insomnia. -Continue Trazodone 100 mg nightly for insomnia   Benzodiazepine taper for benzodiazepine withdrawal management: -Continue Ativan 2 mg po Q 8 hrs x 1 dose (03-26-22). -Continue Ativan 1.5 mg po Q 8 hrs x 3 dose (03-27-22). -Continue Ativan 1 mg po Q 8 hrs x 3 doses (03-28-22).  -Continue Ativan 0.5 mg po Q 8 hrs x 3 doses (03-29-22).  -Continue Ativan 0.5 mg po Q 12 hrs x 2 doses (03-30-22).  -Continue Ativan 0.5 mg po Q daily x 2 doses (03-31-22).   Other medical issues. -Continue Farxiga 10 mg po daily for DM.  -Continue Metoprolol 12.5 mg po daily for HTN/cardiac issues.  -Continue Aldactone 12.5 mg po daily for HTN.  -Continue Entresto 24-26 po bid for HTN. -Continue Cepacol Lozenges PRN for cough -Continue Lidoderm patch for back pain    Agitation protocols. -Continue as recommended (see MAR).  -Continue Hydroxyzine 25 mg every 6 hours PRN  Other PRNS -Continue Tylenol 650 mg every 6 hours PRN for mild pain -Continue Maalox 30 mg every 4 hrs PRN for indigestion -Continue Imodium 2-4 mg as needed for diarrhea -Continue Milk of Magnesia as needed every 6 hrs for constipation -Continue Zofran disintegrating tabs every 6 hrs PRN for nausea   Discharge Planning: Social work and case management to assist with discharge planning and  identification of hospital follow-up needs prior to discharge Estimated LOS: 5-7 days Discharge Concerns: Need to establish a safety plan; Medication compliance and  effectiveness Discharge Goals: Return home with outpatient referrals for mental health follow-up including medication management/psychotherapy  I certify that inpatient services furnished can reasonably be expected to improve the patient's condition.    Nicholes Rough, NP 1/3/20243:41 PM  Patient ID: VERA WOLFFE, male   DOB: [birth year], 42 y.o.   MRN: [id number]

## 2022-03-28 NOTE — Progress Notes (Signed)
D: Pt denied SI/HI/AVH this morning. Pt rated his depression a 4/10, anxiety a 3/10, and feelings of hopelessness a 0/10. Pt denies having withdrawal symptoms at this time. Pt denies any physical problems/ pain. Pt seen participating in groups and interacting on the unit. Pt has been pleasant, calm, and cooperative throughout the shift.   A: RN provided support and encouragement to patient. Pt given scheduled medications as prescribed. Fluid consumption has been monitored throughout shift per MD order. Q15 min checks verified for safety.    R: Patient verbally contracts for safety. Patient compliant with medications and treatment plan. Patient is interacting well on the unit. Pt is safe on the unit.   03/28/22 0924  Psych Admission Type (Psych Patients Only)  Admission Status Involuntary  Psychosocial Assessment  Patient Complaints None  Eye Contact Fair  Facial Expression Flat  Affect Depressed;Sad  Speech Logical/coherent  Interaction Minimal  Motor Activity Other (Comment) (WDL)  Appearance/Hygiene Unremarkable  Behavior Characteristics Appropriate to situation;Calm  Mood Depressed;Sad  Thought Process  Coherency WDL  Content WDL  Delusions None reported or observed  Perception WDL  Hallucination None reported or observed  Judgment Impaired  Confusion None  Danger to Self  Current suicidal ideation? Denies  Self-Injurious Behavior No self-injurious ideation or behavior indicators observed or expressed   Agreement Not to Harm Self Yes  Description of Agreement Verbally contracts for safety  Danger to Others  Danger to Others None reported or observed

## 2022-03-28 NOTE — Progress Notes (Signed)
   03/27/22 2100  Psych Admission Type (Psych Patients Only)  Admission Status Involuntary  Psychosocial Assessment  Patient Complaints None  Eye Contact Fair  Facial Expression Flat  Affect Sad;Depressed  Speech Logical/coherent  Interaction Minimal;Isolative  Motor Activity Other (Comment) (WDL)  Appearance/Hygiene Unremarkable  Behavior Characteristics Appropriate to situation;Calm  Mood Depressed  Thought Process  Coherency WDL  Content WDL  Delusions None reported or observed  Perception WDL  Hallucination None reported or observed  Judgment Limited  Confusion None  Danger to Self  Current suicidal ideation? Denies  Self-Injurious Behavior No self-injurious ideation or behavior indicators observed or expressed   Danger to Others  Danger to Others None reported or observed   Patient alert and oriented. Presenting with a sad, depressed affect and mood. Patient denies SI, HI, AVH, and pain. Scheduled medication lorazepam held per Sandra Cockayne, NP due to low BP. Patient refused scheduled trazodone. Patient fluid intake for 01/02  1.299 L. Support and encouragement provided.  Routine safety checks conducted every 15 minutes. Patient verbally contracts for safety and remains safe on unit.

## 2022-03-29 MED ORDER — HYDROCORTISONE 1 % EX CREA
TOPICAL_CREAM | Freq: Two times a day (BID) | CUTANEOUS | Status: DC
  Administered 2022-03-30: 1 via TOPICAL
  Filled 2022-03-29 (×2): qty 28

## 2022-03-29 NOTE — Progress Notes (Signed)
Patient reports he woke up this morning with a bright red rash that is painful and itching around his groin area. Bilateral inside of upper legs have a red rash with bumps. Pt denies having any sexual intercourse recently. Pt reports he has been using his normal soap from home and he does not believe its any type of allergic reaction to soap. Doris NP made aware. Benadryl capsule given for itching relief.

## 2022-03-29 NOTE — Progress Notes (Signed)
Psychoeducational Group Note  Date:  03/29/2022 Time:  2032  Group Topic/Focus:  Wrap-Up Group:   The focus of this group is to help patients review their daily goal of treatment and discuss progress on daily workbooks.  Participation Level: Did Not Attend  Participation Quality:  Not Applicable  Affect:  Not Applicable  Cognitive:  Not Applicable  Insight:  Not Applicable  Engagement in Group: Not Applicable  Additional Comments:  The patient did not attend group this evening.   Archie Balboa S 03/29/2022, 8:32 PM

## 2022-03-29 NOTE — Progress Notes (Signed)
[facility] MD Progress Note  03/29/2022 4:37 PM Jared [name]  MRN:  [id number] Principal Problem: Bipolar 1 disorder, depressed, severe (HCC) Diagnosis: Principal Problem:   Bipolar 1 disorder, depressed, severe (HCC) Active Problems:   Severe opioid use disorder (HCC)   Psychoactive substance-induced mood disorder (HCC)   Alcohol use disorder   Severe benzodiazepine use disorder (HCC)   PTSD (post-traumatic stress disorder)   GAD (generalized anxiety disorder)  Reason For Admission: Jared [name] is a 42 year old Caucasian male with hx of stimulant (methamphetamine, opioid & alcohol use disorders including opioid. Patient is known in this [facility] from his previous admission in 2018 with similar complaints. He is admitted to the [facility] this time around with complaints of intentional suicide attempt by overdose on heroin/fentanyl, Xanax & alcohol. Patient apparently was found unconscious by his mother after he ingested the drugs. His mother called 911& he was taken to the [facility] ED where he was intubated. After medical stabilization & clearance, Jared [name] was transferred to the [facility] for further psychiatric evaluation & treatments. A review of his chart has shown that his UDS was positive for Barbiturates, benzodiazepine, opiates & THC.   24 hr chart review: V/S remain WNL in the past 24 hrs. pt continues to take  all scheduled meds as ordered.  No acute issues noted per chart review of past 24 hrs. patient has not attended most group sessions in the past 24 hours.  This afternoon, patient's assigned RN reports a bright red rash in patient's bilateral groins but none on his genitalia.  Patient denies being sexually active currently.   Pt assessment note, 03/29/2021: Mood today remains less depressed, affect is congruent. Pt's attention to personal hygiene and grooming continues to be fair, eye contact is good, speech is clear & coherent. Thought contents are organized and logical, and pt  currently denies SI/HI/AVH or paranoia. There is no evidence of delusional thoughts.   Pt reports an improved sleep quality last night after taking trazodone.  He reports a good appetite, denies being in any physical distress other than having the rash to his bilateral upper thigh areas.  Orders given for Benadryl 50 mg and hydrocortisone 1% cream to be applied to area.  Patient denies any other concerns.  Patient has been educated that tentative discharge date for him is Sunday, 04/01/2022, and has verbalized understanding and is in agreement with this discharge date.  Patient is not being discharged soon because he is benzo detox protocol and is on that day.  He has stated that he will be living with his mother after discharge.  All medications are being continued at this time, with no changes.  Total Time spent with patient: 45 minutes  Past Psychiatric History: As listed above  Past Medical History:  Past Medical History:  Diagnosis Date   Asthma    Gastric ulcer    Hepatitis C    Hypertension    History reviewed. No pertinent surgical history. Family History:  Family History  Problem Relation Age of Onset   Hypertension Father    Family Psychiatric  History: See H & P Social History:  Social History   Substance and Sexual Activity  Alcohol Use Yes   Alcohol/week: 2.0 standard drinks of alcohol   Types: 2 Glasses of wine per week     Social History   Substance and Sexual Activity  Drug Use Yes   Types: IV, "Crack" cocaine, Cocaine, Fentanyl, Heroin, MDMA (Ecstacy)   Comment:  Polysubstance Use hx. Pt reports more recent has just been Cocaine and Heroin.    Social History   Socioeconomic History   Marital status: Single    Spouse name: Not on file   Number of children: 0   Years of education: Not on file   Highest education level: Not on file  Occupational History   Not on file  Tobacco Use   Smoking status: Former    Types: Cigarettes   Smokeless tobacco: Never   Vaping Use   Vaping Use: Former  Substance and Sexual Activity   Alcohol use: Yes    Alcohol/week: 2.0 standard drinks of alcohol    Types: 2 Glasses of wine per week   Drug use: Yes    Types: IV, "Crack" cocaine, Cocaine, Fentanyl, Heroin, MDMA (Ecstacy)    Comment: Polysubstance Use hx. Pt reports more recent has just been Cocaine and Heroin.   Sexual activity: Not Currently  Other Topics Concern   Not on file  Social History Narrative   Not on file   Social Determinants of Health   Financial Resource Strain: Low Risk  (03/23/2022)   Overall Financial Resource Strain (CARDIA)    Difficulty of Paying Living Expenses: Not very hard  Food Insecurity: No Food Insecurity (03/25/2022)   Hunger Vital Sign    Worried About Running Out of Food in the Last Year: Never true    Ran Out of Food in the Last Year: Never true  Transportation Needs: No Transportation Needs (03/25/2022)   PRAPARE - Administrator, Civil Service (Medical): No    Lack of Transportation (Non-Medical): No  Physical Activity: Not on file  Stress: Not on file  Social Connections: Not on file   Sleep: Fair  Appetite:  Good  Current Medications: Current Facility-Administered Medications  Medication Dose Route Frequency Provider Last Rate Last Admin   alum & mag hydroxide-simeth (MAALOX/MYLANTA) 200-200-20 MG/5ML suspension 30 mL  30 mL Oral Q4H PRN Carlyn Reichert, MD       cariprazine [name]) capsule 3 mg  3 mg Oral Daily Carlyn Reichert, MD   3 mg at 03/29/22 6767   dapagliflozin propanediol (FARXIGA) tablet 10 mg  10 mg Oral Daily Armandina Stammer I, NP   10 mg at 03/29/22 0805   diphenhydrAMINE (BENADRYL) capsule 50 mg  50 mg Oral Q6H PRN Carlyn Reichert, MD       haloperidol (HALDOL) tablet 5 mg  5 mg Oral TID PRN Phineas Inches, MD       And   LORazepam (ATIVAN) tablet 2 mg  2 mg Oral TID PRN Phineas Inches, MD       And   diphenhydrAMINE (BENADRYL) capsule 50 mg  50 mg Oral TID PRN  Phineas Inches, MD   50 mg at 03/29/22 1523   haloperidol lactate (HALDOL) injection 5 mg  5 mg Intramuscular TID PRN Massengill, Harrold Donath, MD       And   LORazepam (ATIVAN) injection 2 mg  2 mg Intramuscular TID PRN Massengill, Harrold Donath, MD       And   diphenhydrAMINE (BENADRYL) injection 50 mg  50 mg Intramuscular TID PRN Massengill, Harrold Donath, MD       gabapentin (NEURONTIN) capsule 300 mg  300 mg Oral TID Carlyn Reichert, MD   300 mg at 03/29/22 1323   hydrocortisone cream 1 %   Topical BID Starleen Blue, NP       hydrOXYzine (ATARAX) tablet 25 mg  25 mg Oral TID  PRN Carlyn Reichert, MD   25 mg at 03/28/22 1610   lidocaine (LIDODERM) 5 % 1 patch  1 patch Transdermal Daily Massengill, Nathan, MD   1 patch at 03/27/22 1329   LORazepam (ATIVAN) tablet 0.5 mg  0.5 mg Oral Q8H Massengill, Nathan, MD   0.5 mg at 03/29/22 1323   Followed by   Melene Muller ON 03/30/2022] LORazepam (ATIVAN) tablet 0.5 mg  0.5 mg Oral Q12H Massengill, Nathan, MD       Followed by   Melene Muller ON 03/31/2022] LORazepam (ATIVAN) tablet 0.5 mg  0.5 mg Oral Daily Massengill, Harrold Donath, MD       magnesium hydroxide (MILK OF MAGNESIA) suspension 30 mL  30 mL Oral Daily PRN Carlyn Reichert, MD       menthol-cetylpyridinium (CEPACOL) lozenge 3 mg  1 lozenge Oral PRN Massengill, Harrold Donath, MD   3 mg at 03/28/22 1128   metoprolol succinate (TOPROL-XL) 24 hr tablet 12.5 mg  12.5 mg Oral Daily Carlyn Reichert, MD   12.5 mg at 03/29/22 0804   sacubitril-valsartan (ENTRESTO) 24-26 mg per tablet  1 tablet Oral BID Carlyn Reichert, MD   1 tablet at 03/29/22 1062   spironolactone (ALDACTONE) tablet 12.5 mg  12.5 mg Oral Daily Carlyn Reichert, MD   12.5 mg at 03/29/22 0804   traZODone (DESYREL) tablet 100 mg  100 mg Oral QHS Massengill, Harrold Donath, MD   100 mg at 03/28/22 2153   traZODone (DESYREL) tablet 50 mg  50 mg Oral QHS PRN Massengill, Harrold Donath, MD        Lab Results: No results found for this or any previous visit (from the past 48 hour(s)).  Blood  Alcohol level:  Lab Results  Component Value Date   ETH <10 03/16/2022   ETH <10 01/08/2017   Metabolic Disorder Labs: No results found for: "HGBA1C", "MPG" No results found for: "PROLACTIN" No results found for: "CHOL", "TRIG", "HDL", "CHOLHDL", "VLDL", "LDLCALC"  Physical Findings: AIMS:0 CIWA:    COWS:     Musculoskeletal: Strength & Muscle Tone: within normal limits Gait & Station: normal Patient leans: N/A  Psychiatric Specialty Exam:  Presentation  General Appearance:  Appropriate for Environment; Casual  Eye Contact: Fair  Speech: Clear and Coherent  Speech Volume: Normal  Handedness: Right  Mood and Affect  Mood: Depressed  Affect: Congruent  Thought Process  Thought Processes: Coherent  Descriptions of Associations:Intact  Orientation:Full (Time, Place and Person)  Thought Content:Logical  History of Schizophrenia/Schizoaffective disorder:No data recorded Duration of Psychotic Symptoms:No data recorded Hallucinations:Hallucinations: None  Ideas of Reference:None  Suicidal Thoughts:Suicidal Thoughts: No  Homicidal Thoughts:Homicidal Thoughts: No   Sensorium  Memory: Immediate Good  Judgment: Good  Insight: Good  Executive Functions  Concentration: Good  Attention Span: Good  Recall: Good  Fund of Knowledge: Good  Language: Good  Psychomotor Activity  Psychomotor Activity: Psychomotor Activity: Normal   Assets  Assets: Resilience; Social Support   Sleep  Sleep: Sleep: Good    Physical Exam: Physical Exam Constitutional:      Appearance: Normal appearance.  HENT:     Nose: Nose normal. No congestion or rhinorrhea.  Eyes:     Pupils: Pupils are equal, round, and reactive to light.  Musculoskeletal:        General: Normal range of motion.     Cervical back: Normal range of motion.  Neurological:     Mental Status: He is alert and oriented to person, place, and time.     Sensory: No sensory  deficit.    Review of Systems  Constitutional:  Negative for fever.  HENT: Negative.    Eyes: Negative.   Respiratory: Negative.    Cardiovascular: Negative.   Gastrointestinal: Negative.   Genitourinary: Negative.   Musculoskeletal: Negative.   Skin: Negative.   Neurological: Negative.   Psychiatric/Behavioral:  Positive for depression and substance abuse. Negative for hallucinations, memory loss and suicidal ideas. The patient is nervous/anxious and has insomnia.    Blood pressure 124/79, pulse 69, temperature 98.4 F (36.9 C), temperature source Oral, resp. rate 16, height 6\' 1"  (1.854 m), weight 70.3 kg, SpO2 100 %. Body mass index is 20.45 kg/m.   Treatment Plan Summary:  Treatment Plan Summary: Daily contact with patient to assess and evaluate symptoms and progress in treatment and Medication management  Observation Level/Precautions:  15 minute checks  Laboratory:  Labs reviewed   Psychotherapy:  Unit Group sessions  Medications:  See [facility]  Consultations:  To be determined   Discharge Concerns:  Safety, medication compliance, mood stability  Estimated LOS: 5-7 days  Other:  N/A   Labs Reviewed on 03/27/21: TSH, HA1C & Lipid panel pending. BMP, CBC, UA reviewed. WBC-13.2, but this has reduced significantly as compared to one from 8 days ago. Tox screen +Opiates, +Benzos, +THC, +Barbs. EKG from 03/16/22 with Qtc-415.  PLAN Safety and Monitoring: Voluntary admission to inpatient psychiatric unit for safety, stabilization and treatment Daily contact with patient to assess and evaluate symptoms and progress in treatment Patient's case to be discussed in multi-disciplinary team meeting Observation Level : q15 minute checks Vital signs: q12 hours Precautions:Safety  Long Term Goal(s): Improvement in symptoms so as ready for discharge  Short Term Goals: Ability to verbalize feelings will improve, Ability to disclose and discuss suicidal ideas, Ability to demonstrate  self-control will improve, Ability to identify and develop effective coping behaviors will improve, Compliance with prescribed medications will improve, and Ability to identify triggers associated with substance abuse/mental health issues will improve  Diagnoses:  Principal Problem:   Bipolar 1 disorder, depressed, severe (HCC) Active Problems:   Severe opioid use disorder (HCC)   Psychoactive substance-induced mood disorder ([place])   Alcohol use disorder   Severe benzodiazepine use disorder (HCC)   PTSD (post-traumatic stress disorder)   GAD (generalized anxiety disorder)  Medications -Continue Vrayalar 3 mg po daily for mood control. -Continue gabapentin 300 mg po tid for anxiety/substance withdrawal syndrome. -Continue Vistaril 25 mg po tid prn for anxiety.  -Continue Trazodone 50 mg po Q hs prn for insomnia. -Continue Trazodone 100 mg nightly for insomnia   Benzodiazepine taper for benzodiazepine withdrawal management: -Continue Ativan 2 mg po Q 8 hrs x 1 dose (03-26-22). -Continue Ativan 1.5 mg po Q 8 hrs x 3 dose (03-27-22). -Continue Ativan 1 mg po Q 8 hrs x 3 doses (03-28-22).  -Continue Ativan 0.5 mg po Q 8 hrs x 3 doses (03-29-22).  -Continue Ativan 0.5 mg po Q 12 hrs x 2 doses (03-30-22).  -Continue Ativan 0.5 mg po Q daily x 2 doses (03-31-22).   Other medical issues. -Continue Farxiga 10 mg po daily for DM.  -Continue Metoprolol 12.5 mg po daily for HTN/cardiac issues.  -Continue Aldactone 12.5 mg po daily for HTN.  -Continue Entresto 24-26 po bid for HTN. -Continue Cepacol Lozenges PRN for cough -Continue Lidoderm patch for back pain    Agitation protocols. -Continue as recommended (see MAR).  -Continue Hydroxyzine 25 mg every 6 hours PRN  Other PRNS -Continue Tylenol 650  mg every 6 hours PRN for mild pain -Continue Maalox 30 mg every 4 hrs PRN for indigestion -Continue Imodium 2-4 mg as needed for diarrhea -Continue Milk of Magnesia as needed every 6 hrs for  constipation -Continue Zofran disintegrating tabs every 6 hrs PRN for nausea   Discharge Planning: Social work and case management to assist with discharge planning and identification of hospital follow-up needs prior to discharge Estimated LOS: 5-7 days Discharge Concerns: Need to establish a safety plan; Medication compliance and effectiveness Discharge Goals: Return home with outpatient referrals for mental health follow-up including medication management/psychotherapy  I certify that inpatient services furnished can reasonably be expected to improve the patient's condition.    Nicholes Rough, NP 1/4/20244:37 PM  Patient ID: Jared [name], male   DOB: [birth year], 42 y.o.   MRN: [id number] Patient ID: Jared [name], male   DOB: 1980-08-15, 42 y.o.   MRN: [id number]

## 2022-03-29 NOTE — Progress Notes (Signed)
Cumulative intake of fluids for the day is 813ml. Attempted to document in flow sheets but unable to insert it. Oncoming shift made aware.

## 2022-03-29 NOTE — Progress Notes (Signed)
D: Patient is alert, oriented, and cooperative. Denies SI, HI, AVH, and verbally contracts for safety. Patient reports he slept good last night with sleeping medication. Patient reports his appetite as good, energy level as normal, and concentration as good. Patient rates his depression 0/10, hopelessness 0/10, and anxiety 0/10. Patient denies physical symptoms/pain.    A: Scheduled medications administered per MD order. Support provided. Patient educated on safety on the unit and medications. Routine safety checks every 15 minutes. Patient stated understanding to tell nurse about any new physical symptoms. Patient understands to tell staff of any needs.     R: No adverse drug reactions noted. Patient verbally contracts for safety. Patient remains safe at this time and will continue to monitor.    03/29/22 1000  Psych Admission Type (Psych Patients Only)  Admission Status Involuntary  Psychosocial Assessment  Patient Complaints None  Eye Contact Fair  Facial Expression Flat  Affect Depressed  Speech Logical/coherent  Interaction Minimal  Motor Activity Slow  Appearance/Hygiene Unremarkable  Behavior Characteristics Cooperative;Appropriate to situation;Calm  Mood Depressed  Thought Process  Coherency WDL  Content WDL  Delusions None reported or observed  Perception WDL  Hallucination None reported or observed  Judgment Limited  Confusion None  Danger to Self  Current suicidal ideation? Denies  Self-Injurious Behavior No self-injurious ideation or behavior indicators observed or expressed   Agreement Not to Harm Self Yes  Description of Agreement verbal contract for safety  Danger to Others  Danger to Others None reported or observed

## 2022-03-29 NOTE — Group Note (Signed)
[facility] LCSW Group Therapy Note   Group Date: 03/29/2022 Start Time: 1100 End Time: 1200   Type of Therapy/Topic:  Group Therapy:  Balance in Life  Participation Level:  Did Not Attend   Description of Group:    This group will address the concept of balance and how it feels and looks when one is unbalanced. Patients will be encouraged to process areas in their lives that are out of balance, and identify reasons for remaining unbalanced. Facilitators will guide patients utilizing problem- solving interventions to address and correct the stressor making their life unbalanced. Understanding and applying boundaries will be explored and addressed for obtaining  and maintaining a balanced life. Patients will be encouraged to explore ways to assertively make their unbalanced needs known to significant others in their lives, using other group members and facilitator for support and feedback.  Therapeutic Goals: Patient will identify two or more emotions or situations they have that consume much of in their lives. Patient will identify signs/triggers that life has become out of balance:  Patient will identify two ways to set boundaries in order to achieve balance in their lives:  Patient will demonstrate ability to communicate their needs through discussion and/or role plays  Summary of Patient Progress:        Therapeutic Modalities:   Cognitive Behavioral Therapy Solution-Focused Therapy Assertiveness Training   [place], LCSW

## 2022-03-30 ENCOUNTER — Encounter (HOSPITAL_COMMUNITY): Payer: Self-pay

## 2022-03-30 LAB — HEMOGLOBIN A1C

## 2022-03-30 NOTE — Progress Notes (Addendum)
   03/29/22 2106  Psych Admission Type (Psych Patients Only)  Admission Status Involuntary  Psychosocial Assessment  Patient Complaints None  Eye Contact Fair  Facial Expression Flat  Affect Depressed  Speech Logical/coherent  Interaction Minimal  Motor Activity Slow  Appearance/Hygiene Unremarkable  Behavior Characteristics Cooperative;Appropriate to situation;Calm  Mood Depressed  Thought Process  Coherency WDL  Content WDL  Delusions None reported or observed  Perception WDL  Hallucination None reported or observed  Judgment Limited  Confusion None  Danger to Self  Current suicidal ideation? Denies  Self-Injurious Behavior No self-injurious ideation or behavior indicators observed or expressed   Agreement Not to Harm Self Yes  Description of Agreement Verbally contracts for safety.  Danger to Others  Danger to Others None reported or observed   Patient alert and oriented. Presenting with a depressed affect and mood. Patient denies SI, HI, AVH, and pain. Patient denies anxiety and depression. Patient refused 0.5 mg lorazepam and stated "I'm good on it, I'm supposed to stop taking it". Patient educated on lorazepam taper and refused administration. Scheduled trazodone administered to patient, per provider orders. 1.5 L fluid restriction maintained, total fluid intake 01/04 1204.3 ml. Support and encouragement provided. Routine safety checks conducted every 15 minutes. Patient verbally contracts for safety. Patient receptive, calm, and cooperative. Patient remains safe on the unit.

## 2022-03-30 NOTE — BHH Group Notes (Signed)
Pt did not attend AA group  

## 2022-03-30 NOTE — Progress Notes (Signed)
   03/30/22 2200  Psych Admission Type (Psych Patients Only)  Admission Status Involuntary  Psychosocial Assessment  Patient Complaints None  Eye Contact Fair  Facial Expression Flat  Affect Depressed  Speech Logical/coherent  Interaction Minimal  Motor Activity Slow  Appearance/Hygiene Unremarkable  Behavior Characteristics Cooperative;Appropriate to situation  Mood Depressed  Thought Process  Coherency WDL  Content WDL  Delusions None reported or observed  Perception WDL  Hallucination None reported or observed  Judgment Limited  Confusion None  Danger to Self  Current suicidal ideation? Denies  Self-Injurious Behavior No self-injurious ideation or behavior indicators observed or expressed   Agreement Not to Harm Self Yes  Description of Agreement verbally contracts  Danger to Others  Danger to Others None reported or observed   Alert/oriented. Makes needs/concerns known to staff. Pleasant cooperative with staff. Denies SI/HI/A/V hallucinations. Med compliant. Patient states went to group.  Will encourage continue compliance and progression towards goals. Verbally contracted for safety. Will continue to monitor.

## 2022-03-30 NOTE — Plan of Care (Signed)
  Problem: Activity: Goal: Sleeping patterns will improve Outcome: Progressing   

## 2022-03-30 NOTE — Progress Notes (Signed)
   03/30/22 0610  15 Minute Checks  Location Dayroom  Visual Appearance Calm  Behavior Composed  Sleep (Behavioral Health Patients Only)  Calculate sleep? (Click Yes once per 24 hr at 0600 safety check) Yes  Documented sleep last 24 hours 8

## 2022-03-30 NOTE — BH IP Treatment Plan (Signed)
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[facility])   Alcohol use disorder   Severe benzodiazepine use disorder ([place])   PTSD (post-traumatic stress disorder)   GAD (generalized anxiety disorder)   Current Medications:  Current Facility-Administered Medications  Medication Dose Route Frequency Provider Last Rate Last Admin   alum & mag hydroxide-simeth (MAALOX/MYLANTA) 200-200-20 MG/5ML suspension 30 mL  30 mL Oral Q4H PRN Corky Sox, MD       cariprazine Arman Filter) capsule 3 mg  3 mg Oral Daily Corky Sox, MD   3 mg at 03/30/22 4782   dapagliflozin propanediol (FARXIGA) tablet 10 mg  10 mg Oral Daily Lindell Spar I, NP   10 mg at 03/30/22 0755   diphenhydrAMINE (BENADRYL) capsule 50 mg  50 mg Oral Q6H PRN Corky Sox, MD       haloperidol (HALDOL) tablet 5 mg  5 mg Oral TID PRN Janine Limbo, MD       And   LORazepam (ATIVAN) tablet 2 mg  2 mg Oral TID PRN Janine Limbo, MD       And   diphenhydrAMINE (BENADRYL) capsule 50 mg  50 mg Oral TID PRN Janine Limbo, MD   50 mg at 03/29/22 1523   haloperidol lactate (HALDOL) injection 5 mg  5 mg Intramuscular TID PRN Massengill, Ovid Curd, MD       And   LORazepam (ATIVAN) injection 2 mg  2 mg Intramuscular TID PRN Massengill, Ovid Curd, MD       And   diphenhydrAMINE (BENADRYL) injection 50 mg  50 mg Intramuscular TID PRN Massengill, Ovid Curd, MD       gabapentin (NEURONTIN) capsule 300 mg  300 mg Oral TID Corky Sox, MD   300 mg at 03/30/22 1255   hydrocortisone cream 1 %   Topical BID Nicholes Rough, NP   Given at 03/30/22 0813   hydrOXYzine (ATARAX) tablet 25 mg  25 mg Oral TID PRN  Corky Sox, MD   25 mg at 03/30/22 0636   lidocaine (LIDODERM) 5 % 1 patch  1 patch Transdermal Daily Massengill, Nathan, MD   1 patch at 03/27/22 1329   LORazepam (ATIVAN) tablet 0.5 mg  0.5 mg Oral Q12H Massengill, Nathan, MD   0.5 mg at 03/30/22 0818   Followed by   Derrill Memo ON 03/31/2022] LORazepam (ATIVAN) tablet 0.5 mg  0.5 mg Oral Daily Massengill, Nathan, MD       magnesium hydroxide (MILK OF MAGNESIA) suspension 30 mL  30 mL Oral Daily PRN Corky Sox, MD       menthol-cetylpyridinium (CEPACOL) lozenge 3 mg  1 lozenge Oral PRN Massengill, Ovid Curd, MD   3 mg at 03/28/22 1128   metoprolol succinate (TOPROL-XL) 24 hr tablet 12.5 mg  12.5 mg Oral Daily Corky Sox, MD   12.5 mg at 03/30/22 9562   sacubitril-valsartan (ENTRESTO) 24-26 mg per tablet  1 tablet Oral BID Corky Sox, MD   1 tablet at 03/30/22 1308   spironolactone (ALDACTONE) tablet 12.5 mg  12.5 mg Oral Daily Corky Sox, MD   12.5 mg at 03/30/22 0756   traZODone (DESYREL) tablet 100 mg  100 mg Oral QHS Janine Limbo, MD   100 mg at 03/29/22 2106   traZODone (  DESYREL) tablet 50 mg  50 mg Oral QHS PRN Massengill, Ovid Curd, MD       PTA Medications: Medications Prior to Admission  Medication Sig Dispense Refill Last Dose   alprazolam (XANAX) 2 MG tablet Take 2 mg by mouth in the morning, at noon, in the evening, and at bedtime.   Past Month   Multiple Vitamins-Minerals (MULTIVITAMIN WITH MINERALS) tablet Take 1 tablet by mouth daily.   Past Week   cariprazine (VRAYLAR) 3 MG capsule Take 1 capsule (3 mg total) by mouth daily. 30 capsule     dapagliflozin propanediol (FARXIGA) 10 MG TABS tablet Take 1 tablet (10 mg total) by mouth daily. 30 tablet     gabapentin (NEURONTIN) 300 MG capsule Take 1 capsule (300 mg total) by mouth 2 (two) times daily.      metoprolol succinate (TOPROL-XL) 25 MG 24 hr tablet Take 0.5 tablets (12.5 mg total) by mouth daily.      sacubitril-valsartan (ENTRESTO) 24-26 MG Take 1 tablet  by mouth 2 (two) times daily. 60 tablet     spironolactone (ALDACTONE) 25 MG tablet Take 0.5 tablets (12.5 mg total) by mouth daily.      thiamine (VITAMIN B1) 100 MG tablet Take 1 tablet (100 mg total) by mouth daily. 30 tablet 0     Patient Stressors: Legal issue   Marital or family conflict   Medication change or noncompliance   Substance abuse    Patient Strengths: Average or above average intelligence  Capable of independent living  Communication skills  General fund of knowledge  Motivation for treatment/growth  Supportive family/friends   Treatment Modalities: Medication Management, Group therapy, Case management,  1 to 1 session with clinician, Psychoeducation, Recreational therapy.   Physician Treatment Plan for Primary Diagnosis: Bipolar 1 disorder, depressed, severe ([place]) Long Term Goal(s): Improvement in symptoms so as ready for discharge   Short Term Goals: Ability to verbalize feelings will improve Ability to disclose and discuss suicidal ideas Ability to demonstrate self-control will improve Ability to identify and develop effective coping behaviors will improve Compliance with prescribed medications will improve Ability to identify triggers associated with substance abuse/mental health issues will improve  Medication Management: Evaluate patient's response, side effects, and tolerance of medication regimen.  Therapeutic Interventions: 1 to 1 sessions, Unit Group sessions and Medication administration.  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[facility])   Alcohol use disorder   Severe benzodiazepine use disorder ([facility])   PTSD (post-traumatic stress disorder)   GAD (generalized anxiety disorder)  Long Term Goal(s): Improvement in symptoms so as ready for  discharge   Short Term Goals: Ability to verbalize feelings will improve Ability to disclose and discuss suicidal ideas Ability to demonstrate self-control will improve Ability to identify and develop effective coping behaviors will improve Compliance with prescribed medications will improve Ability to identify triggers associated with substance abuse/mental health issues will improve     Medication Management: Evaluate patient's response, side effects, and tolerance of medication regimen.  Therapeutic Interventions: 1 to 1 sessions, Unit Group sessions and Medication administration.  Evaluation of Outcomes: Progressing   RN Treatment Plan for Primary Diagnosis: Bipolar 1 disorder, depressed, severe ([place]) Long Term Goal(s): Knowledge of disease and therapeutic regimen to maintain health will improve  Short Term Goals: Ability to remain free from injury will improve,  Ability to verbalize frustration and anger appropriately will improve, Ability to demonstrate self-control, Ability to participate in decision making will improve, Ability to verbalize feelings will improve, Ability to disclose and discuss suicidal ideas, Ability to identify and develop effective coping behaviors will improve, and Compliance with prescribed medications will improve  Medication Management: RN will administer medications as ordered by provider, will assess and evaluate patient's response and provide education to patient for prescribed medication. RN will report any adverse and/or side effects to prescribing provider.  Therapeutic Interventions: 1 on 1 counseling sessions, Psychoeducation, Medication administration, Evaluate responses to treatment, Monitor vital signs and CBGs as ordered, Perform/monitor CIWA, COWS, AIMS and Fall Risk screenings as ordered, Perform wound care treatments as ordered.  Evaluation of Outcomes: Progressing   LCSW Treatment Plan for Primary Diagnosis: Bipolar 1 disorder, depressed,  severe (H[facility]) Long Term Goal(s): Safe transition to appropriate next level of care at discharge, Engage patient in therapeutic group addressing interpersonal concerns.  Short Term Goals: Engage patient in aftercare planning with referrals and resources, Increase social support, Increase ability to appropriately verbalize feelings, Increase emotional regulation, Facilitate acceptance of mental health diagnosis and concerns, Facilitate patient progression through stages of change regarding substance use diagnoses and concerns, Identify triggers associated with mental health/substance abuse issues, and Increase skills for wellness and recovery  Therapeutic Interventions: Assess for all discharge needs, 1 to 1 time with Social worker, Explore available resources and support systems, Assess for adequacy in community support network, Educate family and significant other(s) on suicide prevention, Complete Psychosocial Assessment, Interpersonal group therapy.  Evaluation of Outcomes: Progressing   Progress in Treatment: Attending groups: Yes. Participating in groups: Yes. Taking medication as prescribed: Yes. Toleration medication: Yes. Family/Significant other contact made: Yes, individual(s) contacted:  Leone Brand, [phone] Patient understands diagnosis: Yes. Discussing patient identified problems/goals with staff: Yes. Medical problems stabilized or resolved: Yes. Denies suicidal/homicidal ideation: Yes. Issues/concerns per patient self-inventory: Yes. Other:   New problem(s) identified: No, Describe:  None Reported  New Short Term/Long Term Goal(s): Medication Stabilization  Patient Goals:  Coping Skills  Discharge Plan or Barriers:   Reason for Continuation of Hospitalization: Aggression Anxiety Delusions  Depression Medication stabilization Suicidal ideation Withdrawal symptoms\  Estimated Length of Stay: 3-7 Days  Last 3 [place] Suicide Severity Risk Score: Flowsheet Row  [facility]-Admission (Discharged) from 03/16/2022 in [place] [facility]  C-SSRS RISK CATEGORY No Risk High Risk       Last PHQ 2/9 Scores:     No data to display         detox, medication management for mood stabilization; elimination of SI thoughts; development of comprehensive mental wellness/sobriety plan   Scribe for Treatment Team: Ane Payment, LCSW 03/30/2022 1:39 PM

## 2022-03-30 NOTE — Progress Notes (Signed)
D: Patient alert and oriented, able to make needs known. Denies SI/HI, AVH at present. Denies pain at present. Patient goal today "keep happy." Rates depression 0/10, hopelessness 0/10, and anxiety 0/10. Patient reports energy level as good. He reports he slept fair last night. Patient does not request any PRN medication at this time.   A: Scheduled medications administered to patient per MD order. Support and encouragement provided. Routine safety checks conducted every fifteen minutes. Patient informed to notify staff with problems or concerns. Frequent verbal contact made.   R: No adverse drug reactions noted. Patient contracts for safety at this time. Patient is compliant with medications and treatment plan. Patient receptive, calm and cooperative. Patient interacts with others appropriately on unit at present. Patient remains safe at present.

## 2022-03-30 NOTE — Progress Notes (Signed)
[facility] MD Progress Note  03/30/2022 2:16 PM Jared [name]  MRN:  [id number] Principal Problem: Bipolar 1 disorder, depressed, severe (HCC) Diagnosis: Principal Problem:   Bipolar 1 disorder, depressed, severe (HCC) Active Problems:   Severe opioid use disorder (HCC)   Psychoactive substance-induced mood disorder (HCC)   Alcohol use disorder   Severe benzodiazepine use disorder (HCC)   PTSD (post-traumatic stress disorder)   GAD (generalized anxiety disorder)  Reason For Admission: Jared [name] is a 42 year old Caucasian male with hx of stimulant (methamphetamine, opioid & alcohol use disorders including opioid. Patient is known in this [facility] from his previous admission in 2018 with similar complaints. He is admitted to the [facility] this time around with complaints of intentional suicide attempt by overdose on heroin/fentanyl, Xanax & alcohol. Patient apparently was found unconscious by his mother after he ingested the drugs. His mother called 911& he was taken to the [facility] ED where he was intubated. After medical stabilization & clearance, Jared [name] was transferred to the [facility] for further psychiatric evaluation & treatments. A review of his chart has shown that his UDS was positive for Barbiturates, benzodiazepine, opiates & THC.   24 hr chart review: V/S remain WNL in the past 24 hrs. pt continues to take  all scheduled meds as ordered.  No acute issues noted per chart review of past 24 hrs. Patient has not attended most group sessions in the past 24 hours.  Pt slept for 8 hrs last night as per nursing documentation. Pt refused his Ativan 0.5 mg last night, stated as per nursing documentation that he was supposed to stop taking it.   Pt assessment note, 03/30/2021: Mood today continues to be less depressed, affect is congruent. Pt's attention to personal hygiene and grooming is good, eye contact is good, speech is clear & coherent. Thought contents are organized and logical, and pt  currently denies SI/HI/AVH or paranoia. There is no evidence of delusional thoughts.   Pt reports a fair sleep quality last night after taking trazodone.  He reports a good appetite, denies being in any physical distress.  Pt reports that the rash in his b/l upper thigh has improved on the hydrocortisone 1% cream applied to area. He denies pain or itching in area. Patient denies any other concerns.  Patient has been educated that tentative discharge date for him is Sunday, 04/01/2022, and has verbalized understanding and is in agreement with this discharge date.  Patient has been educated to continue taking the Ativan until it stops since it is part of his detox protocol. Ativan taper ends on 1/6. He has stated that he will be living with his mother after discharge.  All medications are being continued at this time, with no changes.  Total Time spent with patient: 45 minutes  Past Psychiatric History: As listed above  Past Medical History:  Past Medical History:  Diagnosis Date   Asthma    Gastric ulcer    Hepatitis C    Hypertension    History reviewed. No pertinent surgical history. Family History:  Family History  Problem Relation Age of Onset   Hypertension Father    Family Psychiatric  History: See H & P Social History:  Social History   Substance and Sexual Activity  Alcohol Use Yes   Alcohol/week: 2.0 standard drinks of alcohol   Types: 2 Glasses of wine per week     Social History   Substance and Sexual Activity  Drug Use Yes  Types: IV, "Crack" cocaine, Cocaine, Fentanyl, Heroin, MDMA (Ecstacy)   Comment: Polysubstance Use hx. Pt reports more recent has just been Cocaine and Heroin.    Social History   Socioeconomic History   Marital status: Single    Spouse name: Not on file   Number of children: 0   Years of education: Not on file   Highest education level: Not on file  Occupational History   Not on file  Tobacco Use   Smoking status: Former    Types:  Cigarettes   Smokeless tobacco: Never  Vaping Use   Vaping Use: Former  Substance and Sexual Activity   Alcohol use: Yes    Alcohol/week: 2.0 standard drinks of alcohol    Types: 2 Glasses of wine per week   Drug use: Yes    Types: IV, "Crack" cocaine, Cocaine, Fentanyl, Heroin, MDMA (Ecstacy)    Comment: Polysubstance Use hx. Pt reports more recent has just been Cocaine and Heroin.   Sexual activity: Not Currently  Other Topics Concern   Not on file  Social History Narrative   Not on file   Social Determinants of Health   Financial Resource Strain: Low Risk  (03/23/2022)   Overall Financial Resource Strain (CARDIA)    Difficulty of Paying Living Expenses: Not very hard  Food Insecurity: No Food Insecurity (03/25/2022)   Hunger Vital Sign    Worried About Running Out of Food in the Last Year: Never true    Ran Out of Food in the Last Year: Never true  Transportation Needs: No Transportation Needs (03/25/2022)   PRAPARE - Administrator, Civil Service (Medical): No    Lack of Transportation (Non-Medical): No  Physical Activity: Not on file  Stress: Not on file  Social Connections: Not on file   Sleep: Fair  Appetite:  Good  Current Medications: Current Facility-Administered Medications  Medication Dose Route Frequency Provider Last Rate Last Admin   alum & mag hydroxide-simeth (MAALOX/MYLANTA) 200-200-20 MG/5ML suspension 30 mL  30 mL Oral Q4H PRN Carlyn Reichert, MD       cariprazine [name]) capsule 3 mg  3 mg Oral Daily Carlyn Reichert, MD   3 mg at 03/30/22 1017   dapagliflozin propanediol (FARXIGA) tablet 10 mg  10 mg Oral Daily Armandina Stammer I, NP   10 mg at 03/30/22 0755   diphenhydrAMINE (BENADRYL) capsule 50 mg  50 mg Oral Q6H PRN Carlyn Reichert, MD       haloperidol (HALDOL) tablet 5 mg  5 mg Oral TID PRN Phineas Inches, MD       And   LORazepam (ATIVAN) tablet 2 mg  2 mg Oral TID PRN Phineas Inches, MD       And   diphenhydrAMINE  (BENADRYL) capsule 50 mg  50 mg Oral TID PRN Phineas Inches, MD   50 mg at 03/29/22 1523   haloperidol lactate (HALDOL) injection 5 mg  5 mg Intramuscular TID PRN Massengill, Harrold Donath, MD       And   LORazepam (ATIVAN) injection 2 mg  2 mg Intramuscular TID PRN Massengill, Harrold Donath, MD       And   diphenhydrAMINE (BENADRYL) injection 50 mg  50 mg Intramuscular TID PRN Massengill, Harrold Donath, MD       gabapentin (NEURONTIN) capsule 300 mg  300 mg Oral TID Carlyn Reichert, MD   300 mg at 03/30/22 1255   hydrocortisone cream 1 %   Topical BID Starleen Blue, NP   Given at  03/30/22 0813   hydrOXYzine (ATARAX) tablet 25 mg  25 mg Oral TID PRN Carlyn Reichert, MD   25 mg at 03/30/22 0636   lidocaine (LIDODERM) 5 % 1 patch  1 patch Transdermal Daily Massengill, Nathan, MD   1 patch at 03/27/22 1329   LORazepam (ATIVAN) tablet 0.5 mg  0.5 mg Oral Q12H Massengill, Nathan, MD   0.5 mg at 03/30/22 0818   Followed by   Melene Muller ON 03/31/2022] LORazepam (ATIVAN) tablet 0.5 mg  0.5 mg Oral Daily Massengill, Harrold Donath, MD       magnesium hydroxide (MILK OF MAGNESIA) suspension 30 mL  30 mL Oral Daily PRN Carlyn Reichert, MD       menthol-cetylpyridinium (CEPACOL) lozenge 3 mg  1 lozenge Oral PRN Phineas Inches, MD   3 mg at 03/28/22 1128   metoprolol succinate (TOPROL-XL) 24 hr tablet 12.5 mg  12.5 mg Oral Daily Carlyn Reichert, MD   12.5 mg at 03/30/22 2694   sacubitril-valsartan (ENTRESTO) 24-26 mg per tablet  1 tablet Oral BID Carlyn Reichert, MD   1 tablet at 03/30/22 8546   spironolactone (ALDACTONE) tablet 12.5 mg  12.5 mg Oral Daily Carlyn Reichert, MD   12.5 mg at 03/30/22 0756   traZODone (DESYREL) tablet 100 mg  100 mg Oral QHS Massengill, Harrold Donath, MD   100 mg at 03/29/22 2106   traZODone (DESYREL) tablet 50 mg  50 mg Oral QHS PRN Massengill, Harrold Donath, MD        Lab Results: No results found for this or any previous visit (from the past 48 hour(s)).  Blood Alcohol level:  Lab Results  Component Value Date    ETH <10 03/16/2022   ETH <10 01/08/2017   Metabolic Disorder Labs: Lab Results  Component Value Date   HGBA1C QNSTST 03/26/2022   No results found for: "PROLACTIN" No results found for: "CHOL", "TRIG", "HDL", "CHOLHDL", "VLDL", "LDLCALC"  Physical Findings: AIMS:0 CIWA:    COWS:     Musculoskeletal: Strength & Muscle Tone: within normal limits Gait & Station: normal Patient leans: N/A  Psychiatric Specialty Exam:  Presentation  General Appearance:  Appropriate for Environment; Casual  Eye Contact: Fair  Speech: Clear and Coherent  Speech Volume: Normal  Handedness: Right  Mood and Affect  Mood: Depressed  Affect: Congruent  Thought Process  Thought Processes: Coherent  Descriptions of Associations:Intact  Orientation:Full (Time, Place and Person)  Thought Content:Logical  History of Schizophrenia/Schizoaffective disorder:No data recorded Duration of Psychotic Symptoms:No data recorded Hallucinations:Hallucinations: None  Ideas of Reference:None  Suicidal Thoughts:Suicidal Thoughts: No  Homicidal Thoughts:Homicidal Thoughts: No   Sensorium  Memory: Immediate Good  Judgment: Good  Insight: Good  Executive Functions  Concentration: Good  Attention Span: Good  Recall: Good  Fund of Knowledge: Good  Language: Good  Psychomotor Activity  Psychomotor Activity: Psychomotor Activity: Normal   Assets  Assets: Communication Skills   Sleep  Sleep: Sleep: Fair    Physical Exam: Physical Exam Constitutional:      Appearance: Normal appearance.  HENT:     Nose: Nose normal. No congestion or rhinorrhea.  Eyes:     Pupils: Pupils are equal, round, and reactive to light.  Musculoskeletal:        General: Normal range of motion.     Cervical back: Normal range of motion.  Neurological:     Mental Status: He is alert and oriented to person, place, and time.     Sensory: No sensory deficit.    Review of  Systems   Constitutional:  Negative for fever.  HENT: Negative.    Eyes: Negative.   Respiratory: Negative.    Cardiovascular: Negative.   Gastrointestinal: Negative.   Genitourinary: Negative.   Musculoskeletal: Negative.   Skin: Negative.   Neurological: Negative.   Psychiatric/Behavioral:  Positive for depression and substance abuse. Negative for hallucinations, memory loss and suicidal ideas. The patient has insomnia. The patient is not nervous/anxious.    Blood pressure 113/78, pulse 83, temperature 98.1 F (36.7 C), temperature source Oral, resp. rate 16, height 6\' 1"  (1.854 m), weight 70.3 kg, SpO2 100 %. Body mass index is 20.45 kg/m.   Treatment Plan Summary:  Treatment Plan Summary: Daily contact with patient to assess and evaluate symptoms and progress in treatment and Medication management  Observation Level/Precautions:  15 minute checks  Laboratory:  Labs reviewed   Psychotherapy:  Unit Group sessions  Medications:  See [facility]  Consultations:  To be determined   Discharge Concerns:  Safety, medication compliance, mood stability  Estimated LOS: 5-7 days  Other:  N/A   Labs Reviewed on 03/30/21: TSH, HA1C & Lipid panel pending. BMP, CBC, UA reviewed. WBC-13.2, but this has reduced significantly as compared to one from 8 days ago. Tox screen +Opiates, +Benzos, +THC, +Barbs. EKG from 03/16/22 with Qtc-415.  PLAN Safety and Monitoring: Voluntary admission to inpatient psychiatric unit for safety, stabilization and treatment Daily contact with patient to assess and evaluate symptoms and progress in treatment Patient's case to be discussed in multi-disciplinary team meeting Observation Level : q15 minute checks Vital signs: q12 hours Precautions:Safety  Long Term Goal(s): Improvement in symptoms so as ready for discharge  Short Term Goals: Ability to verbalize feelings will improve, Ability to disclose and discuss suicidal ideas, Ability to demonstrate self-control will improve,  Ability to identify and develop effective coping behaviors will improve, Compliance with prescribed medications will improve, and Ability to identify triggers associated with substance abuse/mental health issues will improve  Diagnoses:  Principal Problem:   Bipolar 1 disorder, depressed, severe (HCC) Active Problems:   Severe opioid use disorder (HCC)   Psychoactive substance-induced mood disorder ([place])   Alcohol use disorder   Severe benzodiazepine use disorder (HCC)   PTSD (post-traumatic stress disorder)   GAD (generalized anxiety disorder)  Medications -Continue Vrayalar 3 mg po daily for mood control. -Continue gabapentin 300 mg po tid for anxiety/substance withdrawal syndrome. -Continue Vistaril 25 mg po tid prn for anxiety.  -Continue Trazodone 50 mg po Q hs prn for insomnia. -Continue Trazodone 100 mg nightly for insomnia   Benzodiazepine taper for benzodiazepine withdrawal management: -Continue Ativan 2 mg po Q 8 hrs x 1 dose (03-26-22). -Continue Ativan 1.5 mg po Q 8 hrs x 3 dose (03-27-22). -Continue Ativan 1 mg po Q 8 hrs x 3 doses (03-28-22).  -Continue Ativan 0.5 mg po Q 8 hrs x 3 doses (03-29-22).  -Continue Ativan 0.5 mg po Q 12 hrs x 2 doses (03-30-22).  -Continue Ativan 0.5 mg po Q daily x 2 doses (03-31-22).   Other medical issues. -Continue Farxiga 10 mg po daily for DM.  -Continue Metoprolol 12.5 mg po daily for HTN/cardiac issues.  -Continue Aldactone 12.5 mg po daily for HTN.  -Continue Entresto 24-26 po bid for HTN. -Continue Cepacol Lozenges PRN for cough -Continue Lidoderm patch for back pain    Agitation protocols. -Continue as recommended (see MAR).  -Continue Hydroxyzine 25 mg every 6 hours PRN  Other PRNS -Continue Tylenol 650 mg every 6  hours PRN for mild pain -Continue Maalox 30 mg every 4 hrs PRN for indigestion -Continue Imodium 2-4 mg as needed for diarrhea -Continue Milk of Magnesia as needed every 6 hrs for constipation -Continue  Zofran disintegrating tabs every 6 hrs PRN for nausea   Discharge Planning: Social work and case management to assist with discharge planning and identification of hospital follow-up needs prior to discharge Estimated LOS: 5-7 days Discharge Concerns: Need to establish a safety plan; Medication compliance and effectiveness Discharge Goals: Return home with outpatient referrals for mental health follow-up including medication management/psychotherapy  I certify that inpatient services furnished can reasonably be expected to improve the patient's condition.    Nicholes Rough, NP 1/5/20242:16 PM  Patient ID: Jared [name], male   DOB: 02-19-1981, 42 y.o.   MRN: [id number] Patient ID: Jared [name], male   DOB: 10-Jan-1981, 42 y.o.   MRN: [id number] Patient ID: Jared [name], male   DOB: 26-Oct-1980, 42 y.o.   MRN: [id number]

## 2022-03-30 NOTE — Plan of Care (Signed)
  Problem: Education: Goal: Knowledge of [place] [facility] Education information/materials will improve Outcome: Progressing Goal: Emotional status will improve Outcome: Progressing Goal: Mental status will improve Outcome: Progressing Goal: Verbalization of understanding the information provided will improve Outcome: Progressing   Problem: Activity: Goal: Interest or engagement in activities will improve Outcome: Progressing Goal: Sleeping patterns will improve Outcome: Progressing   Problem: Coping: Goal: Ability to verbalize frustrations and anger appropriately will improve Outcome: Progressing Goal: Ability to demonstrate self-control will improve Outcome: Progressing   Problem: Health Behavior/Discharge Planning: Goal: Identification of resources available to assist in meeting health care needs will improve Outcome: Progressing Goal: Compliance with treatment plan for underlying cause of condition will improve Outcome: Progressing   Problem: Physical Regulation: Goal: Ability to maintain clinical measurements within normal limits will improve Outcome: Progressing   Problem: Safety: Goal: Periods of time without injury will increase Outcome: Progressing   Problem: Education: Goal: Ability to make informed decisions regarding treatment will improve Outcome: Progressing   Problem: Coping: Goal: Coping ability will improve Outcome: Progressing   Problem: Health Behavior/Discharge Planning: Goal: Identification of resources available to assist in meeting health care needs will improve Outcome: Progressing   Problem: Medication: Goal: Compliance with prescribed medication regimen will improve Outcome: Progressing   Problem: Self-Concept: Goal: Ability to disclose and discuss suicidal ideas will improve Outcome: Progressing Goal: Will verbalize positive feelings about self Outcome: Progressing   

## 2022-03-31 MED ORDER — CLOTRIMAZOLE 1 % EX CREA
TOPICAL_CREAM | Freq: Two times a day (BID) | CUTANEOUS | Status: DC
  Administered 2022-04-01: 1 via TOPICAL
  Filled 2022-03-31 (×2): qty 15

## 2022-03-31 NOTE — Group Note (Signed)
[facility] LCSW Group Therapy Note   Date/Time:  03/31/2022 11:15AM-12:00PM   Type of Therapy and Topic:  Group Therapy:  Feelings About Hospitalization   Participation Level:  Did Not Attend    Description of Group This process group involved patients discussing their feelings related to being hospitalized, as well as the benefits they see to being in the hospital.  These feelings and benefits were itemized.  The group then brainstormed specific ways in which they could seek those same benefits when they discharge and return home.   Therapeutic Goals Patient will identify and describe positive and negative feelings related to hospitalization Patient will verbalize benefits of hospitalization to themselves personally Patients will brainstorm together ways they can obtain similar benefits in the outpatient setting, identify barriers to wellness and possible solutions   Summary of Patient Progress:  N/A   Therapeutic Modalities Cognitive Behavioral Therapy Motivational Interviewing       Dakwon Wenberg Grossman-Orr, LCSW 03/31/2022, 11:52 AM  

## 2022-03-31 NOTE — [facility] Group Notes (Signed)
Goals Group 03/31/22   Group Focus: affirmation, clarity of thought, and goals/reality orientation Treatment Modality:  Psychoeducation Interventions utilized were assignment, group exercise, and support Purpose: To be able to understand and verbalize the reason for their admission to the hospital. To understand that the medication helps with their chemical imbalance but they also need to work on their choices in life. To be challenged to develop a list of 30 positives about themselves. Also introduce the concept that "feelings" are not reality.  Participation Level:  did not attend Additional Comments:  ...  Paulino Rily

## 2022-03-31 NOTE — Progress Notes (Signed)
   03/31/22 2225  Psych Admission Type (Psych Patients Only)  Admission Status Involuntary  Psychosocial Assessment  Patient Complaints None  Eye Contact Brief  Facial Expression Animated  Affect Appropriate to circumstance  Speech Logical/coherent  Interaction Assertive  Motor Activity Other (Comment) (WDL)  Appearance/Hygiene Unremarkable  Behavior Characteristics Appropriate to situation  Mood Anxious  Thought Process  Coherency WDL  Content WDL  Delusions None reported or observed  Perception WDL  Hallucination None reported or observed  Judgment WDL  Confusion None  Danger to Self  Current suicidal ideation? Denies  Self-Injurious Behavior No self-injurious ideation or behavior indicators observed or expressed   Agreement Not to Harm Self Yes  Description of Agreement verbal  Danger to Others  Danger to Others None reported or observed

## 2022-03-31 NOTE — [facility] Group Notes (Signed)
[facility] Group Notes:  (Nursing/MHT/Case Management/Adjunct)  Date:  03/31/2022  Time:  9:38 AM    Group Topic/Focus:  Goals Group:   The focus of this group is to help patients establish daily goals to achieve during treatment and discuss how the patient can incorporate goal setting into their daily lives to aide in recovery.  Participation Level:  Active  Participation Quality:  Appropriate  Affect:  Appropriate  Cognitive:  Appropriate  Insight:  Appropriate  Engagement in Group:  Engaged  Modes of Intervention:  Discussion  Summary of Progress/Problems: Patient goal for the day is to smile more.   Alric Seton 03/31/2022, 9:38 AM

## 2022-03-31 NOTE — Progress Notes (Signed)
   03/31/22 1000  Psych Admission Type (Psych Patients Only)  Admission Status Involuntary  Psychosocial Assessment  Patient Complaints None  Eye Contact Brief  Facial Expression Anxious  Affect Blunted  Speech Logical/coherent  Interaction Assertive  Motor Activity Slow  Appearance/Hygiene Unremarkable  Behavior Characteristics Cooperative;Appropriate to situation  Mood Anxious  Thought Process  Coherency WDL  Content WDL  Delusions None reported or observed  Perception WDL  Hallucination None reported or observed  Judgment WDL  Confusion None  Danger to Self  Current suicidal ideation? Denies  Danger to Others  Danger to Others None reported or observed

## 2022-03-31 NOTE — Progress Notes (Signed)
[facility] MD Progress Note  03/31/2022 4:14 PM Jared [name]  MRN:  [id number] Principal Problem: Bipolar 1 disorder, depressed, severe (HCC) Diagnosis: Principal Problem:   Bipolar 1 disorder, depressed, severe (HCC) Active Problems:   Severe opioid use disorder (HCC)   Psychoactive substance-induced mood disorder (HCC)   Alcohol use disorder   Severe benzodiazepine use disorder (HCC)   PTSD (post-traumatic stress disorder)   GAD (generalized anxiety disorder)  Reason For Admission: Jared [name] is a 42 year old Caucasian male with hx of stimulant (methamphetamine, opioid & alcohol use disorders including opioid. Patient is known in this [facility] from his previous admission in 2018 with similar complaints. He is admitted to the [facility] this time around with complaints of intentional suicide attempt by overdose on heroin/fentanyl, Xanax & alcohol. Patient apparently was found unconscious by his mother after he ingested the drugs. His mother called 911& he was taken to the [facility] ED where he was intubated. After medical stabilization & clearance, Jared [name] was transferred to the [facility] for further psychiatric evaluation & treatments. A review of his chart has shown that his UDS was positive for Barbiturates, benzodiazepine, opiates & THC.   24 hr chart review: V/S remain WNL in the past 24 hrs. pt continues to take  all scheduled meds as ordered.  No acute issues noted per chart review of past 24 hrs. Patient has not attended most group sessions in the past 24 hours.  Pt slept for 7.5 hrs last night as per nursing documentation. Hydroxyzine 25 mg given earlier today morning for anxiety.  Pt assessment note, 03/31/2021: Mood today is euthymic, affect is congruent. Pt's attention to personal hygiene and grooming is good, eye contact is good, speech is clear & coherent. Thought contents are organized and logical, and pt currently denies SI/HI/AVH or paranoia. There is no evidence of delusional  thoughts.   Pt reports a good sleep quality last night , reports a good appetite, denies any current concerns. He denies medication related side effects, verbalizes readiness for discharge, states that he plans to go back to residing with his mother after discharge. Pt's assigned RN educated pt's mother on current medications, and she verbalized understanding. Lotrimin 1 % cream ordered for rash to b/l groin. All medications continued as listed below.  Total Time spent with patient: 45 minutes  Past Psychiatric History: As listed above  Past Medical History:  Past Medical History:  Diagnosis Date   Asthma    Gastric ulcer    Hepatitis C    Hypertension    History reviewed. No pertinent surgical history. Family History:  Family History  Problem Relation Age of Onset   Hypertension Father    Family Psychiatric  History: See H & P Social History:  Social History   Substance and Sexual Activity  Alcohol Use Yes   Alcohol/week: 2.0 standard drinks of alcohol   Types: 2 Glasses of wine per week     Social History   Substance and Sexual Activity  Drug Use Yes   Types: IV, "Crack" cocaine, Cocaine, Fentanyl, Heroin, MDMA (Ecstacy)   Comment: Polysubstance Use hx. Pt reports more recent has just been Cocaine and Heroin.    Social History   Socioeconomic History   Marital status: Single    Spouse name: Not on file   Number of children: 0   Years of education: Not on file   Highest education level: Not on file  Occupational History   Not on file  Tobacco Use   Smoking status: Former    Types: Cigarettes   Smokeless tobacco: Never  Vaping Use   Vaping Use: Former  Substance and Sexual Activity   Alcohol use: Yes    Alcohol/week: 2.0 standard drinks of alcohol    Types: 2 Glasses of wine per week   Drug use: Yes    Types: IV, "Crack" cocaine, Cocaine, Fentanyl, Heroin, MDMA (Ecstacy)    Comment: Polysubstance Use hx. Pt reports more recent has just been Cocaine and  Heroin.   Sexual activity: Not Currently  Other Topics Concern   Not on file  Social History Narrative   Not on file   Social Determinants of Health   Financial Resource Strain: Low Risk  (03/23/2022)   Overall Financial Resource Strain (CARDIA)    Difficulty of Paying Living Expenses: Not very hard  Food Insecurity: No Food Insecurity (03/25/2022)   Hunger Vital Sign    Worried About Running Out of Food in the Last Year: Never true    Ran Out of Food in the Last Year: Never true  Transportation Needs: No Transportation Needs (03/25/2022)   PRAPARE - Administrator, Civil Service (Medical): No    Lack of Transportation (Non-Medical): No  Physical Activity: Not on file  Stress: Not on file  Social Connections: Not on file   Sleep: Fair  Appetite:  Good  Current Medications: Current Facility-Administered Medications  Medication Dose Route Frequency Provider Last Rate Last Admin   alum & mag hydroxide-simeth (MAALOX/MYLANTA) 200-200-20 MG/5ML suspension 30 mL  30 mL Oral Q4H PRN Carlyn Reichert, MD       cariprazine [name]) capsule 3 mg  3 mg Oral Daily Carlyn Reichert, MD   3 mg at 03/31/22 6967   clotrimazole (LOTRIMIN) 1 % cream   Topical BID Starleen Blue, NP   Given at 03/31/22 1600   dapagliflozin propanediol (FARXIGA) tablet 10 mg  10 mg Oral Daily Armandina Stammer I, NP   10 mg at 03/31/22 8938   diphenhydrAMINE (BENADRYL) capsule 50 mg  50 mg Oral Q6H PRN Carlyn Reichert, MD       haloperidol (HALDOL) tablet 5 mg  5 mg Oral TID PRN Phineas Inches, MD       And   LORazepam (ATIVAN) tablet 2 mg  2 mg Oral TID PRN Phineas Inches, MD       And   diphenhydrAMINE (BENADRYL) capsule 50 mg  50 mg Oral TID PRN Phineas Inches, MD   50 mg at 03/31/22 1513   haloperidol lactate (HALDOL) injection 5 mg  5 mg Intramuscular TID PRN Massengill, Harrold Donath, MD       And   LORazepam (ATIVAN) injection 2 mg  2 mg Intramuscular TID PRN Massengill, Harrold Donath, MD       And    diphenhydrAMINE (BENADRYL) injection 50 mg  50 mg Intramuscular TID PRN Massengill, Harrold Donath, MD       gabapentin (NEURONTIN) capsule 300 mg  300 mg Oral TID Carlyn Reichert, MD   300 mg at 03/31/22 1602   hydrocortisone cream 1 %   Topical BID Starleen Blue, NP   Given at 03/31/22 1017   hydrOXYzine (ATARAX) tablet 25 mg  25 mg Oral TID PRN Carlyn Reichert, MD   25 mg at 03/30/22 0636   lidocaine (LIDODERM) 5 % 1 patch  1 patch Transdermal Daily Massengill, Harrold Donath, MD   1 patch at 03/27/22 1329   LORazepam (ATIVAN) tablet 0.5 mg  0.5 mg  Oral Daily Massengill, Nathan, MD   0.5 mg at 03/31/22 0753   magnesium hydroxide (MILK OF MAGNESIA) suspension 30 mL  30 mL Oral Daily PRN Carlyn Reichert, MD       menthol-cetylpyridinium (CEPACOL) lozenge 3 mg  1 lozenge Oral PRN Phineas Inches, MD   3 mg at 03/28/22 1128   metoprolol succinate (TOPROL-XL) 24 hr tablet 12.5 mg  12.5 mg Oral Daily Carlyn Reichert, MD   12.5 mg at 03/31/22 6195   sacubitril-valsartan (ENTRESTO) 24-26 mg per tablet  1 tablet Oral BID Carlyn Reichert, MD   1 tablet at 03/31/22 1602   spironolactone (ALDACTONE) tablet 12.5 mg  12.5 mg Oral Daily Carlyn Reichert, MD   12.5 mg at 03/31/22 0932   traZODone (DESYREL) tablet 100 mg  100 mg Oral QHS Massengill, Harrold Donath, MD   100 mg at 03/30/22 2100   traZODone (DESYREL) tablet 50 mg  50 mg Oral QHS PRN Massengill, Harrold Donath, MD        Lab Results: No results found for this or any previous visit (from the past 48 hour(s)).  Blood Alcohol level:  Lab Results  Component Value Date   ETH <10 03/16/2022   ETH <10 01/08/2017   Metabolic Disorder Labs: Lab Results  Component Value Date   HGBA1C QNSTST 03/26/2022   No results found for: "PROLACTIN" No results found for: "CHOL", "TRIG", "HDL", "CHOLHDL", "VLDL", "LDLCALC"  Physical Findings: AIMS:0 CIWA:    COWS:     Musculoskeletal: Strength & Muscle Tone: within normal limits Gait & Station: normal Patient leans:  N/A  Psychiatric Specialty Exam:  Presentation  General Appearance:  Appropriate for Environment; Casual  Eye Contact: Fair  Speech: Clear and Coherent  Speech Volume: Normal  Handedness: Right  Mood and Affect  Mood: Euthymic  Affect: Appropriate; Congruent  Thought Process  Thought Processes: Coherent  Descriptions of Associations:Intact  Orientation:Full (Time, Place and Person)  Thought Content:Logical  History of Schizophrenia/Schizoaffective disorder:No data recorded Duration of Psychotic Symptoms:No data recorded Hallucinations:Hallucinations: None  Ideas of Reference:None  Suicidal Thoughts:Suicidal Thoughts: No  Homicidal Thoughts:Homicidal Thoughts: No   Sensorium  Memory: Immediate Good  Judgment: Good  Insight: Good  Executive Functions  Concentration: Good  Attention Span: Good  Recall: Good  Fund of Knowledge: Good  Language: Good  Psychomotor Activity  Psychomotor Activity: Psychomotor Activity: Normal   Assets  Assets: Communication Skills; Housing; Social Support   Sleep  Sleep: Sleep: Good    Physical Exam: Physical Exam Constitutional:      Appearance: Normal appearance.  HENT:     Nose: Nose normal. No congestion or rhinorrhea.  Eyes:     Pupils: Pupils are equal, round, and reactive to light.  Musculoskeletal:        General: Normal range of motion.     Cervical back: Normal range of motion.  Neurological:     Mental Status: He is alert and oriented to person, place, and time.     Sensory: No sensory deficit.    Review of Systems  Constitutional:  Negative for fever.  HENT: Negative.    Eyes: Negative.   Respiratory: Negative.    Cardiovascular: Negative.   Gastrointestinal: Negative.   Genitourinary: Negative.   Musculoskeletal: Negative.   Skin: Negative.   Neurological: Negative.   Psychiatric/Behavioral:  Positive for depression and substance abuse. Negative for  hallucinations, memory loss and suicidal ideas. The patient has insomnia. The patient is not nervous/anxious.    Blood pressure 125/82, pulse 86,  temperature 98.9 F (37.2 C), temperature source Oral, resp. rate 16, height 6\' 1"  (1.854 m), weight 70.3 kg, SpO2 100 %. Body mass index is 20.45 kg/m.   Treatment Plan Summary:  Treatment Plan Summary: Daily contact with patient to assess and evaluate symptoms and progress in treatment and Medication management  Observation Level/Precautions:  15 minute checks  Laboratory:  Labs reviewed   Psychotherapy:  Unit Group sessions  Medications:  See [facility]  Consultations:  To be determined   Discharge Concerns:  Safety, medication compliance, mood stability  Estimated LOS: 5-7 days  Other:  N/A   Labs Reviewed on 03/30/21: TSH, HA1C & Lipid panel pending. BMP, CBC, UA reviewed. WBC-13.2, but this has reduced significantly as compared to one from 8 days ago. Tox screen +Opiates, +Benzos, +THC, +Barbs. EKG from 03/16/22 with Qtc-415.  PLAN Safety and Monitoring: Voluntary admission to inpatient psychiatric unit for safety, stabilization and treatment Daily contact with patient to assess and evaluate symptoms and progress in treatment Patient's case to be discussed in multi-disciplinary team meeting Observation Level : q15 minute checks Vital signs: q12 hours Precautions:Safety  Long Term Goal(s): Improvement in symptoms so as ready for discharge  Short Term Goals: Ability to verbalize feelings will improve, Ability to disclose and discuss suicidal ideas, Ability to demonstrate self-control will improve, Ability to identify and develop effective coping behaviors will improve, Compliance with prescribed medications will improve, and Ability to identify triggers associated with substance abuse/mental health issues will improve  Diagnoses:  Principal Problem:   Bipolar 1 disorder, depressed, severe (HCC) Active Problems:   Severe opioid use  disorder (HCC)   Psychoactive substance-induced mood disorder (HCC)   Alcohol use disorder   Severe benzodiazepine use disorder (HCC)   PTSD (post-traumatic stress disorder)   GAD (generalized anxiety disorder)  Medications -Continue Vrayalar 3 mg po daily for mood control. -Continue gabapentin 300 mg po tid for anxiety/substance withdrawal syndrome. -Continue Vistaril 25 mg po tid prn for anxiety.  -Continue Trazodone 50 mg po Q hs prn for insomnia. -Continue Trazodone 100 mg nightly for insomnia   Benzodiazepine taper for benzodiazepine withdrawal management: -Continue Ativan 2 mg po Q 8 hrs x 1 dose (03-26-22). -Continue Ativan 1.5 mg po Q 8 hrs x 3 dose (03-27-22). -Continue Ativan 1 mg po Q 8 hrs x 3 doses (03-28-22).  -Continue Ativan 0.5 mg po Q 8 hrs x 3 doses (03-29-22).  -Continue Ativan 0.5 mg po Q 12 hrs x 2 doses (03-30-22).  -Continue Ativan 0.5 mg po Q daily x 2 doses (03-31-22).   Other medical issues. -Start Lotrimin Cream 1 % cream for b/l groin area -Continue Farxiga 10 mg po daily for DM.  -Continue Metoprolol 12.5 mg po daily for HTN/cardiac issues.  -Continue Aldactone 12.5 mg po daily for HTN.  -Continue Entresto 24-26 po bid for HTN. -Continue Cepacol Lozenges PRN for cough -Continue Lidoderm patch for back pain    Agitation protocols. -Continue as recommended (see MAR).  -Continue Hydroxyzine 25 mg every 6 hours PRN  Other PRNS -Continue Tylenol 650 mg every 6 hours PRN for mild pain -Continue Maalox 30 mg every 4 hrs PRN for indigestion -Continue Imodium 2-4 mg as needed for diarrhea -Continue Milk of Magnesia as needed every 6 hrs for constipation -Continue Zofran disintegrating tabs every 6 hrs PRN for nausea   Discharge Planning: Social work and case management to assist with discharge planning and identification of hospital follow-up needs prior to discharge Estimated LOS: 5-7  days Discharge Concerns: Need to establish a safety plan;  Medication compliance and effectiveness Discharge Goals: Return home with outpatient referrals for mental health follow-up including medication management/psychotherapy  I certify that inpatient services furnished can reasonably be expected to improve the patient's condition.    Nicholes Rough, NP 1/6/20244:14 PM  Patient ID: Jared [name], male   DOB: 06-Mar-1981, 42 y.o.   MRN: [id number] Patient ID:

## 2022-03-31 NOTE — [facility] Group Notes (Signed)
.  Psychoeducational Group Note    Date:  03/31/2022 Time:1300-1400    Purpose of Group: . The group focus' on teaching patients on how to identify their needs and their Life Skills:  A group where two lists are made. What people need and what are things that we do that are unhealthy. The lists are developed by the patients and it is explained that we often do the actions that are not healthy to get our list of needs met.  Goal:: to develop the coping skills needed to get their needs met  Participation Level:  did not attend Additional Comments:    Jared [name] A  

## 2022-04-01 DIAGNOSIS — F314 Bipolar disorder, current episode depressed, severe, without psychotic features: Secondary | ICD-10-CM

## 2022-04-01 MED ORDER — HYDROXYZINE HCL 25 MG PO TABS: 25.0000 mg | ORAL_TABLET | Freq: Three times a day (TID) | ORAL | 0 refills | Status: AC | PRN

## 2022-04-01 MED ORDER — DAPAGLIFLOZIN PROPANEDIOL 10 MG PO TABS: 10.0000 mg | ORAL_TABLET | Freq: Every day | ORAL | 0 refills | Status: AC

## 2022-04-01 MED ORDER — GABAPENTIN 300 MG PO CAPS: 300.0000 mg | ORAL_CAPSULE | Freq: Three times a day (TID) | ORAL | 0 refills | Status: AC

## 2022-04-01 MED ORDER — SACUBITRIL-VALSARTAN 24-26 MG PO TABS: 1.0000 | ORAL_TABLET | Freq: Two times a day (BID) | ORAL | 0 refills | Status: AC

## 2022-04-01 MED ORDER — LORAZEPAM 0.5 MG PO TABS
0.5000 mg | ORAL_TABLET | Freq: Every day | ORAL | Status: AC
  Filled 2022-04-01: qty 1

## 2022-04-01 MED ORDER — TRAZODONE HCL 100 MG PO TABS: 100.0000 mg | ORAL_TABLET | Freq: Every day | ORAL | 0 refills | Status: AC

## 2022-04-01 MED ORDER — MENTHOL 3 MG MT LOZG: 1.0000 | LOZENGE | OROMUCOSAL | 12 refills | Status: AC | PRN

## 2022-04-01 MED ORDER — CARIPRAZINE HCL 3 MG PO CAPS: 3.0000 mg | ORAL_CAPSULE | Freq: Every day | ORAL | 0 refills | Status: AC

## 2022-04-01 MED ORDER — SPIRONOLACTONE 25 MG PO TABS: 12.5000 mg | ORAL_TABLET | Freq: Every day | ORAL | 0 refills | Status: AC

## 2022-04-01 MED ORDER — METOPROLOL SUCCINATE ER 25 MG PO TB24: 12.5000 mg | ORAL_TABLET | Freq: Every day | ORAL | 0 refills | Status: AC

## 2022-04-01 MED ORDER — CLOTRIMAZOLE 1 % EX CREA: TOPICAL_CREAM | Freq: Two times a day (BID) | CUTANEOUS | 0 refills | Status: AC

## 2022-04-01 NOTE — [facility] Group Notes (Signed)
Adult Psychoeducational Group Note Date:  04/01/2022 Time:  0900-1000 Group Topic/Focus: PROGRESSIVE RELAXATION. A group where deep breathing is taught and tensing and relaxation muscle groups is used. Imagery is used as well.  Pts are asked to imagine 3 pillars that hold them up when they are not able to hold themselves up and to share that with the group.   Participation Level:  Active  Participation Quality:  Appropriate  Affect:  Appropriate  Cognitive:  Approprate  Insight: Improving  Engagement in Group:  Engaged  Modes of Intervention:  deep breathing, Imagery. Discussion  Additional Comments:  Pt rates his energy at a 6.5/10. States music, his man cave, and talking to his son.   : Jared [name]

## 2022-04-01 NOTE — Progress Notes (Signed)
  [facility] Adult Case Management Discharge Plan :  Will you be returning to the same living situation after discharge:  Yes,  with mother At discharge, do you have transportation home?: Yes,  mother will transport Do you have the ability to pay for your medications: Yes,  has [employer]  Release of information consent forms completed and in the chart;  Patient's signature needed at discharge.  Patient to Follow up at:  Follow-up [facility]. Go on 04/17/2022.   Why: You have an assessment appointment to re-establish care with this provider on 04/17/22 at 11:30 am.   At this time you will be scheduled for therapy and medication management services. Contact information: [address] [place] [address] [phone]                 Next level of care provider has access to [place] and Suicide Prevention discussed: Yes,  with mother Lenell Antu)     Has patient been referred to the Quitline?: Patient refused referral  Patient has been referred for addiction treatment: Pt. refused referral  Lubertha South, LCSW 04/01/2022, 10:09 AM

## 2022-04-01 NOTE — [facility] Group Notes (Signed)
Adult Psychoeducational Group Note  Date:  04/01/2022 Time:  9:07 AM  Group Topic/Focus:  Goals Group:   The focus of this group is to help patients establish daily goals to achieve during treatment and discuss how the patient can incorporate goal setting into their daily lives to aide in recovery.  Participation Level:  Active  Participation Quality:  Appropriate  Affect:  Appropriate  Cognitive:  Appropriate  Insight: Appropriate  Engagement in Group:  Engaged  Modes of Intervention:  Discussion  Additional Comments:  Patient goal of the day is to smile more.   Jared [name] R Parnell Spieler 04/01/2022, 9:07 AM

## 2022-04-01 NOTE — Discharge Summary (Signed)
Physician Discharge Summary Note  Patient:  Jared [name] is an 42 y.o., male MRN:  [id number] DOB:  [birth year] Patient phone:  [phone] (home)  Patient address:   [address] [place] [address],  Total Time spent with patient: 30 minutes  Date of Admission:  03/25/2022 Date of Discharge: 04/01/2022  Reason For Admission: Jared [name] is a 42 year old Caucasian male with hx of stimulant (methamphetamine, opioid & alcohol use disorders including opioid. Patient is known in this [facility] from his previous admission in 2018 with similar complaints. He is admitted to the [facility] this time around with complaints of intentional suicide attempt by overdose on heroin/fentanyl, Xanax & alcohol. Patient apparently was found unconscious by his mother after he ingested the drugs. His mother called 911& he was taken to the [facility] ED where he was intubated. After medical stabilization & clearance, Jared [name] was transferred to the [facility] for further psychiatric evaluation & treatments. A review of his chart has shown that his UDS was positive for Barbiturates, benzodiazepine, opiates & THC.    Principal Problem: Bipolar 1 disorder, depressed, severe (HCC) Discharge Diagnoses: Principal Problem:   Bipolar 1 disorder, depressed, severe (HCC) Active Problems:   Severe opioid use disorder (HCC)   Psychoactive substance-induced mood disorder (HCC)   Alcohol use disorder   Severe benzodiazepine use disorder (HCC)   PTSD (post-traumatic stress disorder)   GAD (generalized anxiety disorder)  Past Psychiatric History: See H & P                                                                         HOSPITAL COURSE During the patient's hospitalization, patient had extensive initial psychiatric evaluation, and follow-up psychiatric evaluations every day. Psychiatric diagnoses provided upon initial assessment are as listed above.   Patient's psychiatric medications were adjusted on  admission as follows: -Vrayalar 3 mg po daily for mood control. -Gabapentin 300 mg po tid for anxiety/substance withdrawal syndrome. -Vistaril 25 mg po tid prn for anxiety.  -Trazodone 50 mg po Q hs prn for insomnia.   Benzodiazepine taper for benzodiazepine withdrawal management was given as follows: -Ativan 2 mg po Q 8 hrs x 1 dose (03-26-22). -Ativan 1.5 mg po Q 8 hrs x 3 dose (03-27-22). - Ativan 1 mg po Q 8 hrs x 3 doses (03-28-22).  -Ativan 0.5 mg po Q 8 hrs x 3 doses (03-29-22).  -Ativan 0.5 mg po Q 12 hrs x 2 doses (03-30-22).  -Ativan 0.5 mg po Q daily x 2 doses (03-31-22).   Medications started on MedSurg unit prior to coming to [facility] as follows:  -Farxiga 10 mg po daily for DM.  -Metoprolol 12.5 mg po daily for HTN/cardiac issues.  -Aldactone 12.5 mg po daily for HTN.  Sherryll Burger 24-26 po bid for HTN.   During the hospitalization, other adjustments were made to the patient's psychiatric medication regimen. Medications at discharge are as follows: -Continue Vrayalar 3 mg po daily for mood control. -Continue gabapentin 300 mg po tid for anxiety/substance withdrawal syndrome. -Continue Vistaril 25 mg po tid prn for anxiety.  -Continue Trazodone 50 mg po Q hs prn for insomnia. -Continue Trazodone 100 mg nightly for insomnia -Continue Lotrimin Cream 1 %  cream for b/l groin area -Continue Cepacol Lozenges PRN for cough   30 day worth of Prescriptions for the following medications sent to pt's pharmacy at discharge. Medications were started on the medical unit by the medical team. Pt has been educated to follow up with his primary care provider after discharge for a determination regarding if he needs to continue taking these medications or not.   -Continue Farxiga 10 mg po daily for DM.  -Continue Metoprolol 12.5 mg po daily for HTN/cardiac issues.  -Continue Aldactone 12.5 mg po daily for HTN.  -Continue Entresto 24-26 po bid for HTN.   Patient's care was discussed during the  interdisciplinary team meeting every day during the hospitalization. The patient denies having side effects to prescribed psychiatric medication. Gradually, patient started adjusting to milieu. The patient was evaluated each day by a clinical provider to ascertain response to treatment. Improvement was noted by the patient's report of decreasing symptoms, improved sleep and appetite, affect, medication tolerance, behavior, and participation in unit programming.  Patient was asked each day to complete a self inventory noting mood, mental status, pain, new symptoms, anxiety and concerns.     Symptoms were reported as significantly decreased or resolved completely by discharge.  On day of discharge, the patient reports that their mood is stable. The patient denied having suicidal thoughts for more than 48 hours prior to discharge.  Patient denies having homicidal thoughts.  Patient denies having auditory hallucinations.  Patient denies any visual hallucinations or other symptoms of psychosis. The patient was motivated to continue taking medication with a goal of continued improvement in mental health.    The patient reports their target psychiatric symptoms of depression, insomnia & anxiety responded well to the psychiatric medications, and the patient reports overall benefit from this psychiatric hospitalization. Supportive psychotherapy was provided to the patient. The patient also participated in regular group therapy while hospitalized. Coping skills, problem solving as well as relaxation therapies were also part of the unit programming.   Labs were reviewed with the patient, and abnormal results were discussed with the patient. Educated pt to complete TSH & Lipid panel  outpatient.  HA1C WNL & BMP, CBC, UA reviewed. WBC-13.2, but this has reduced significantly as compared to one from 8 days ago. Tox screen +Opiates, +Benzos, +THC, +Barbs. EKG from 03/16/22 with [id number]. Educated pt to f/u with PCP for all  medical conditions.   The patient is able to verbalize their individual safety plan to this provider.   # It is recommended to the patient to continue psychiatric medications as prescribed, after discharge from the hospital.     # It is recommended to the patient to follow up with your outpatient psychiatric provider and PCP.   # It was discussed with the patient, the impact of alcohol, drugs, tobacco have been there overall psychiatric and medical wellbeing, and total abstinence from substance use was recommended the patient.ed.   # Prescriptions provided or sent directly to preferred pharmacy at discharge. Patient agreeable to plan. Given opportunity to ask questions. Appears to feel comfortable with discharge.    # In the event of worsening symptoms, the patient is instructed to call the crisis hotline, 911 and or go to the nearest ED for appropriate evaluation and treatment of symptoms. To follow-up with primary care provider for other medical issues, concerns and or health care needs   # Patient was discharged home to his mother's home with a plan to follow up as noted below.  Past Medical History:  Past Medical History:  Diagnosis Date   Asthma    Gastric ulcer    Hepatitis C    Hypertension    History reviewed. No pertinent surgical history. Family History:  Family History  Problem Relation Age of Onset   Hypertension Father    Family Psychiatric  History: See H & P Social History:  Social History   Substance and Sexual Activity  Alcohol Use Yes   Alcohol/week: 2.0 standard drinks of alcohol   Types: 2 Glasses of wine per week     Social History   Substance and Sexual Activity  Drug Use Yes   Types: IV, "Crack" cocaine, Cocaine, Fentanyl, Heroin, MDMA (Ecstacy)   Comment: Polysubstance Use hx. Pt reports more recent has just been Cocaine and Heroin.    Social History   Socioeconomic History   Marital status: Single    Spouse name: Not on file   Number of  children: 0   Years of education: Not on file   Highest education level: Not on file  Occupational History   Not on file  Tobacco Use   Smoking status: Former    Types: Cigarettes   Smokeless tobacco: Never  Vaping Use   Vaping Use: Former  Substance and Sexual Activity   Alcohol use: Yes    Alcohol/week: 2.0 standard drinks of alcohol    Types: 2 Glasses of wine per week   Drug use: Yes    Types: IV, "Crack" cocaine, Cocaine, Fentanyl, Heroin, MDMA (Ecstacy)    Comment: Polysubstance Use hx. Pt reports more recent has just been Cocaine and Heroin.   Sexual activity: Not Currently  Other Topics Concern   Not on file  Social History Narrative   Not on file   Social Determinants of Health   Financial Resource Strain: Low Risk  (03/23/2022)   Overall Financial Resource Strain (CARDIA)    Difficulty of Paying Living Expenses: Not very hard  Food Insecurity: No Food Insecurity (03/25/2022)   Hunger Vital Sign    Worried About Running Out of Food in the Last Year: Never true    Ran Out of Food in the Last Year: Never true  Transportation Needs: No Transportation Needs (03/25/2022)   PRAPARE - Administrator, Civil Service (Medical): No    Lack of Transportation (Non-Medical): No  Physical Activity: Not on file  Stress: Not on file  Social Connections: Not on file   Physical Findings: AIMS:0 CIWA: n/a COWS: n/a  Musculoskeletal: Strength & Muscle Tone: within normal limits Gait & Station: normal Patient leans: N/A   Psychiatric Specialty Exam:  Presentation  General Appearance:  Appropriate for Environment; Casual  Eye Contact: Good  Speech: Clear and Coherent  Speech Volume: Normal  Handedness: Right   Mood and Affect  Mood: Euthymic  Affect: Congruent   Thought Process  Thought Processes: Coherent  Descriptions of Associations:Intact  Orientation:Full (Time, Place and Person)  Thought Content:Logical  History of  Schizophrenia/Schizoaffective disorder:No data recorded Duration of Psychotic Symptoms:No data recorded Hallucinations:Hallucinations: None  Ideas of Reference:None  Suicidal Thoughts:Suicidal Thoughts: No  Homicidal Thoughts:Homicidal Thoughts: No  Sensorium  Memory: Immediate Good  Judgment: Good  Insight: Good   Executive Functions  Concentration: Good  Attention Span: Good  Recall: Good  Fund of Knowledge: Good  Language: Good  Psychomotor Activity  Psychomotor Activity: Psychomotor Activity: Normal   Assets  Assets: Communication Skills; Resilience; Social Support  Sleep  Sleep: Sleep: Good  Physical  Exam: Physical Exam Review of Systems  Constitutional: Negative.   HENT: Negative.    Eyes: Negative.   Respiratory: Negative.    Cardiovascular: Negative.   Gastrointestinal: Negative.   Genitourinary: Negative.   Musculoskeletal: Negative.   Skin: Negative.   Neurological: Negative.   Psychiatric/Behavioral:  Positive for depression (Denies SI, denies HI, denies AVH, no signs of psychosis, verbally contracts for safety outside of [facility]) and substance abuse. Negative for hallucinations, memory loss and suicidal ideas. The patient is not nervous/anxious and does not have insomnia.    Blood pressure 116/75, pulse 89, temperature 98.4 F (36.9 C), temperature source Oral, resp. rate 18, height 6\' 1"  (1.854 m), weight 70.3 kg, SpO2 100 %. Body mass index is 20.45 kg/m.   Social History   Tobacco Use  Smoking Status Former   Types: Cigarettes  Smokeless Tobacco Never   Tobacco Cessation:  N/A, patient does not currently use tobacco products  Blood Alcohol level:  Lab Results  Component Value Date   ETH <10 03/16/2022   ETH <10 01/08/2017    Metabolic Disorder Labs:  Lab Results  Component Value Date   HGBA1C QNSTST 03/26/2022   No results found for: "PROLACTIN" No results found for: "CHOL", "TRIG", "HDL", "CHOLHDL", "VLDL",  "LDLCALC"  See Psychiatric Specialty Exam and Suicide Risk Assessment completed by Attending Physician prior to discharge.  Discharge destination:  Home  Is patient on multiple antipsychotic therapies at discharge:  No   Has Patient had three or more failed trials of antipsychotic monotherapy by history:  No  Recommended Plan for Multiple Antipsychotic Therapies: NA  Allergies as of 04/01/2022       Reactions   Tylenol [acetaminophen] Rash        Medication List     STOP taking these medications    alprazolam 2 MG tablet Commonly known as: XANAX       TAKE these medications      Indication  cariprazine 3 MG capsule Commonly known as: Vraylar Take 1 capsule (3 mg total) by mouth daily.  Indication: Mood control   clotrimazole 1 % cream Commonly known as: LOTRIMIN Apply topically 2 (two) times daily.  Indication: b/l groin rash   dapagliflozin propanediol 10 MG Tabs tablet Commonly known as: FARXIGA Take 1 tablet (10 mg total) by mouth daily. Start taking on: April 02, 2022  Indication: Cardiac Failure, substance induced HF-ordered by med surg-will need to f/u with PCP to determine if med still needed   gabapentin 300 MG capsule Commonly known as: NEURONTIN Take 1 capsule (300 mg total) by mouth 3 (three) times daily. What changed: when to take this  Indication: anxiety   hydrOXYzine 25 MG tablet Commonly known as: ATARAX Take 1 tablet (25 mg total) by mouth 3 (three) times daily as needed for anxiety.  Indication: Feeling Anxious   menthol-cetylpyridinium 3 MG lozenge Commonly known as: CEPACOL Take 1 lozenge (3 mg total) by mouth as needed for sore throat.  Indication: throat irritation   metoprolol succinate 25 MG 24 hr tablet Commonly known as: TOPROL-XL Take 0.5 tablets (12.5 mg total) by mouth daily. Start taking on: April 02, 2022  Indication: High Blood Pressure Disorder, substance induced HF-ordered by med surg-will need to f/u with PCP to  determine if med still needed   multivitamin with minerals tablet Take 1 tablet by mouth daily.  Indication: Nutritional Support   sacubitril-valsartan 24-26 MG Commonly known as: ENTRESTO Take 1 tablet by mouth 2 (two) times daily.  Indication: substance induced HF-ordered by med surg-will need to f/u with PCP to determine if med still needed   spironolactone 25 MG tablet Commonly known as: ALDACTONE Take 0.5 tablets (12.5 mg total) by mouth daily. Start taking on: April 02, 2022  Indication: High Blood Pressure Disorder, substance induced HF-ordered by med surg-will need to f/u with PCP to determine if med still needed   thiamine 100 MG tablet Commonly known as: VITAMIN B1 Take 1 tablet (100 mg total) by mouth daily.  Indication: nutritional support   traZODone 100 MG tablet Commonly known as: DESYREL Take 1 tablet (100 mg total) by mouth at bedtime.  Indication: [place]. Go on 04/17/2022.   Why: You have an assessment appointment to re-establish care with this provider on 04/17/22 at 11:30 am.   At this time you will be scheduled for therapy and medication management services. Contact information: [address] [place] [address] [phone]                Signed: Nicholes Rough, NP 04/01/2022, 5:47 PM

## 2022-04-01 NOTE — Progress Notes (Signed)
Pt discharged at this time. Pt left facility with mother. Pt removed all belongings and verbalized understanding of discharge instructions, medications, and follow up care. Pt denies suicidal and homicidal thoughts as well as a/v hallucinations.

## 2022-04-01 NOTE — [facility] Suicide Risk Assessment (Signed)
Suicide Risk Assessment  Discharge Assessment    [name] Suicide Risk Assessment   Principal Problem: Bipolar 1 disorder, depressed, severe (HCC) Discharge Diagnoses: Principal Problem:   Bipolar 1 disorder, depressed, severe (HCC) Active Problems:   Severe opioid use disorder (HCC)   Psychoactive substance-induced mood disorder (HCC)   Alcohol use disorder   Severe benzodiazepine use disorder (HCC)   PTSD (post-traumatic stress disorder)   GAD (generalized anxiety disorder)  Reason For Admission: Jared [name]. Jared [name] is a 42 year old Caucasian male with hx of stimulant (methamphetamine, opioid & alcohol use disorders including opioid. Patient is known in this [facility] from his previous admission in 2018 with similar complaints. He is admitted to the [facility] this time around with complaints of intentional suicide attempt by overdose on heroin/fentanyl, Xanax & alcohol. Patient apparently was found unconscious by his mother after he ingested the drugs. His mother called 911& he was taken to [facility] ED where he was intubated. After medical stabilization & clearance, Jared [name] was transferred to the [facility] for further psychiatric evaluation & treatments. A review of his chart has shown that his UDS was positive for Barbiturates, benzodiazepine, opiates & THC.                                                 HOSPITAL COURSE During the patient's hospitalization, patient had extensive initial psychiatric evaluation, and follow-up psychiatric evaluations every day. Psychiatric diagnoses provided upon initial assessment are as listed above.  Patient's psychiatric medications were adjusted on admission as follows: -Vrayalar 3 mg po daily for mood control. -Gabapentin 300 mg po tid for anxiety/substance withdrawal syndrome. -Vistaril 25 mg po tid prn for anxiety.  -Trazodone 50 mg po Q hs prn for insomnia.   Benzodiazepine taper for benzodiazepine withdrawal management was given as  follows: -Ativan 2 mg po Q 8 hrs x 1 dose (03-26-22). -Ativan 1.5 mg po Q 8 hrs x 3 dose (03-27-22). - Ativan 1 mg po Q 8 hrs x 3 doses (03-28-22).  -Ativan 0.5 mg po Q 8 hrs x 3 doses (03-29-22).  -Ativan 0.5 mg po Q 12 hrs x 2 doses (03-30-22).  -Ativan 0.5 mg po Q daily x 2 doses (03-31-22).   Medications started on MedSurg unit prior to coming to [facility] as follows:  -Farxiga 10 mg po daily for DM.  -Metoprolol 12.5 mg po daily for HTN/cardiac issues.  -Aldactone 12.5 mg po daily for HTN.  Jared [name] 24-26 po bid for HTN.  During the hospitalization, other adjustments were made to the patient's psychiatric medication regimen. Medications at discharge are as follows: -Continue Vrayalar 3 mg po daily for mood control. -Continue gabapentin 300 mg po tid for anxiety/substance withdrawal syndrome. -Continue Vistaril 25 mg po tid prn for anxiety.  -Continue Trazodone 50 mg po Q hs prn for insomnia. -Continue Trazodone 100 mg nightly for insomnia -Continue Lotrimin Cream 1 % cream for b/l groin area -Continue Cepacol Lozenges PRN for cough  30 day worth of Prescriptions for the following medications sent to pt's pharmacy at discharge. Medications were started on the medical unit by the medical team. Pt has been educated to follow up with his primary care provider after discharge for a determination regarding if he needs to continue taking these medications or not.  -Continue Farxiga 10 mg po daily for DM.  -  Continue Metoprolol 12.5 mg po daily for HTN/cardiac issues.  -Continue Aldactone 12.5 mg po daily for HTN.  -Continue Entresto 24-26 po bid for HTN.  Patient's care was discussed during the interdisciplinary team meeting every day during the hospitalization. The patient denies having side effects to prescribed psychiatric medication. Gradually, patient started adjusting to milieu. The patient was evaluated each day by a clinical provider to ascertain response to treatment. Improvement was  noted by the patient's report of decreasing symptoms, improved sleep and appetite, affect, medication tolerance, behavior, and participation in unit programming.  Patient was asked each day to complete a self inventory noting mood, mental status, pain, new symptoms, anxiety and concerns.    Symptoms were reported as significantly decreased or resolved completely by discharge.  On day of discharge, the patient reports that their mood is stable. The patient denied having suicidal thoughts for more than 48 hours prior to discharge.  Patient denies having homicidal thoughts.  Patient denies having auditory hallucinations.  Patient denies any visual hallucinations or other symptoms of psychosis. The patient was motivated to continue taking medication with a goal of continued improvement in mental health.   The patient reports their target psychiatric symptoms of depression, insomnia & anxiety responded well to the psychiatric medications, and the patient reports overall benefit from this psychiatric hospitalization. Supportive psychotherapy was provided to the patient. The patient also participated in regular group therapy while hospitalized. Coping skills, problem solving as well as relaxation therapies were also part of the unit programming.  Labs were reviewed with the patient, and abnormal results were discussed with the patient. Educated pt to complete TSH & Lipid panel  outpatient.  HA1C WNL & BMP, CBC, UA reviewed. WBC-13.2, but this has reduced significantly as compared to one from 8 days ago. Tox screen +Opiates, +Benzos, +THC, +Barbs. EKG from 03/16/22 with Qtc-415. Educated pt to f/u with PCP for all medical conditions.  The patient is able to verbalize their individual safety plan to this provider.  # It is recommended to the patient to continue psychiatric medications as prescribed, after discharge from the hospital.    # It is recommended to the patient to follow up with your outpatient  psychiatric provider and PCP.  # It was discussed with the patient, the impact of alcohol, drugs, tobacco have been there overall psychiatric and medical wellbeing, and total abstinence from substance use was recommended the patient.ed.  # Prescriptions provided or sent directly to preferred pharmacy at discharge. Patient agreeable to plan. Given opportunity to ask questions. Appears to feel comfortable with discharge.    # In the event of worsening symptoms, the patient is instructed to call the crisis hotline, 911 and or go to the nearest ED for appropriate evaluation and treatment of symptoms. To follow-up with primary care provider for other medical issues, concerns and or health care needs  # Patient was discharged home to his mother's home with a plan to follow up as noted below.   Total Time spent with patient: 30 minutes  Musculoskeletal: Strength & Muscle Tone: within normal limits Gait & Station: normal Patient leans: N/A  Psychiatric Specialty Exam  Presentation  General Appearance:  Appropriate for Environment; Casual  Eye Contact: Good  Speech: Clear and Coherent  Speech Volume: Normal  Handedness: Right   Mood and Affect  Mood: Euthymic  Duration of Depression Symptoms: No data recorded Affect: Congruent   Thought Process  Thought Processes: Coherent  Descriptions of Associations:Intact  Orientation:Full (Time, Place  and Person)  Thought Content:Logical  History of Schizophrenia/Schizoaffective disorder:No data recorded Duration of Psychotic Symptoms:No data recorded Hallucinations:Hallucinations: None  Ideas of Reference:None  Suicidal Thoughts:Suicidal Thoughts: No  Homicidal Thoughts:Homicidal Thoughts: No   Sensorium  Memory: Immediate Good  Judgment: Good  Insight: Good   Executive Functions  Concentration: Good  Attention Span: Good  Recall: Good  Fund of Knowledge: Good  Language: Good   Psychomotor  Activity  Psychomotor Activity: Psychomotor Activity: Normal   Assets  Assets: Communication Skills; Resilience; Social Support   Sleep  Sleep: Sleep: Good   Physical Exam: Physical Exam Constitutional:      Appearance: Normal appearance.  HENT:     Head: Normocephalic.  Eyes:     Pupils: Pupils are equal, round, and reactive to light.  Musculoskeletal:     Cervical back: Normal range of motion.  Neurological:     General: No focal deficit present.     Mental Status: He is alert and oriented to person, place, and time.    Review of Systems  Constitutional:  Negative for fever.  HENT: Negative.    Eyes: Negative.  Negative for blurred vision.  Respiratory: Negative.  Negative for cough.   Cardiovascular: Negative.  Negative for chest pain.  Gastrointestinal: Negative.   Genitourinary: Negative.   Musculoskeletal: Negative.   Skin: Negative.   Neurological: Negative.   Psychiatric/Behavioral:  Positive for depression (Denies SI, denies HI, denies AVH, verbally contracts for safety outside of [facility]) and substance abuse (Educated on the negative impact of substance use on his mental illness.). Negative for hallucinations, memory loss and suicidal ideas. The patient is nervous/anxious (resolving on current medications) and has insomnia (Resolved on current medications).    Blood pressure 116/75, pulse 89, temperature 98.4 F (36.9 C), temperature source Oral, resp. rate 18, height 6\' 1"  (1.854 m), weight 70.3 kg, SpO2 100 %. Body mass index is 20.45 kg/m.  Mental Status Per Nursing Assessment::   On Admission:  Suicidal ideation indicated by others  Demographic Factors:  Male  Loss Factors: NA  Historical Factors: NA  Risk Reduction Factors:   Living with another person, especially a relative and Positive social support  Continued Clinical Symptoms:  Alcohol/Substance Abuse/Dependencies  Cognitive Features That Contribute To Risk:  None    Suicide Risk:   Mild:  There are no identifiable suicide plans, no associated intent, mild dysphoria and related symptoms, good self-control (both objective and subjective assessment), few other risk factors, and identifiable protective factors, including available and accessible social support.    Follow-up [facility]. Go on 04/17/2022.   Why: You have an assessment appointment to re-establish care with this provider on 04/17/22 at 11:30 am.   At this time you will be scheduled for therapy and medication management services. Contact information: [place] [address] [phone]                 Nicholes Rough, NP 04/01/2022, 10:39 AM

## 2022-04-02 ENCOUNTER — Telehealth (HOSPITAL_COMMUNITY): Payer: Self-pay | Admitting: Vascular Surgery

## 2022-04-02 NOTE — Telephone Encounter (Signed)
Returned pt , # that was left on VM and the # in system not working , pt needs samples of Entresto and faxiga

## 2022-04-04 NOTE — Telephone Encounter (Addendum)
Patients mother called requesting samples for patient, samples left up front for pick up, patient will be seen in [place] clinic next week.   Medication Samples have been provided to the patient.  Drug name: Wilder Glade       Strength: 10mg         Qty: 4  LOT: [id number] Exp.Date: 08/23/2024  Dosing instructions: take 1 tab po qd  The patient has been instructed regarding the correct time, dose, and frequency of taking this medication, including desired effects and most common side effects.   Nerine Pulse R Riyana Biel 7:41 AM 04/04/2022  Medication Samples have been provided to the patient.  Drug name: Delene Loll       Strength: 24-26mg         Qty: 4  LOT: [id number]  Exp.Date: 06/2024  Dosing instructions: take 1 tab po bid  The patient has been instructed regarding the correct time, dose, and frequency of taking this medication, including desired effects and most common side effects.   Kerina Simoneau R Chala Gul 6:46 AM 04/04/2022

## 2022-04-06 ENCOUNTER — Telehealth (HOSPITAL_COMMUNITY): Payer: Self-pay

## 2022-04-06 NOTE — Telephone Encounter (Signed)
Attempted to reach patient to confirm appointment, but number is not in service.

## 2022-04-09 ENCOUNTER — Encounter (HOSPITAL_COMMUNITY): Payer: BC Managed Care – PPO

## 2022-04-09 ENCOUNTER — Other Ambulatory Visit (HOSPITAL_COMMUNITY): Payer: Self-pay

## 2022-04-09 NOTE — Progress Notes (Incomplete)
HEART & VASCULAR TRANSITION OF CARE CONSULT NOTE     Referring Physician: Primary Care: Primary Cardiologist:  HPI: Referred to clinic by *** for heart failure consultation.   Jared [name] is a 42 yo male w/ Psych hx of intentional overdose several years ago.    Recently presented to the ED via EMS on 03/16/22 after his mother found him down on the floor, unknown time. Heroin, xanax and wine found close by. On EMS arrival given narcan, noted to be hypoxic and hypotensive which required Nor-epi drip and intubated for airway protection.    Labs on admission showed Na+ 135, K+ 4.3, Cr 1.14, Mag 1.6, hsTn 307, lactic 2.4, WBC 11.2, Hgb 17.5. RSV +. Rectal temp 101.3. Admitted to PCCM for septic shock with intentional drug overdose. Treated with IV antibiotics, BCx remained negative. UDS was positive for Barbiturates, benzodiazepine, opiates & THC.    Echo was done and showed reduced LVEF 30-35%, global hypokinesis, mildly reduced RV, normal RV size, no valvular disease. HS trop was elevated, peaking at 387. General cardiology was consulted. 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 Once stable and off pressor support in the ICU, GDMT was initiated. Once medically cleared he was transferred to  inpatient psych facility once medically cleared. During the hospitalization, adjustments were made to his psychiatric medication regimen. He was discharged from [facility] on 1/7. Referred to [facility] for hospital f/u for HF.     Cardiac Testing    2D Echo 12/23   1. Left ventricular ejection fraction, by estimation, is 30 to 35%. The  left ventricle has moderately decreased function. The left ventricle  demonstrates global hypokinesis. Left ventricular diastolic parameters are  indeterminate.   2. Right ventricular systolic function is mildly reduced. The right  ventricular size is normal. There is normal pulmonary artery systolic  pressure.   3. No evidence of mitral valve regurgitation.    4. The aortic valve is tricuspid. Aortic valve regurgitation is not  visualized.   5. The inferior vena cava is dilated in size with >50% respiratory  variability, suggesting right atrial pressure of 8 mmHg.   Comparison(s): No prior Echocardiogram.   Review of Systems: [y] = yes, [ ]  = no   General: Weight gain [ ] ; Weight loss [ ] ; Anorexia [ ] ; Fatigue [ ] ; Fever [ ] ; Chills [ ] ; Weakness [ ]   Cardiac: Chest pain/pressure [ ] ; Resting SOB [ ] ; Exertional SOB [ ] ; Orthopnea [ ] ; Pedal Edema [ ] ; Palpitations [ ] ; Syncope [ ] ; Presyncope [ ] ; Paroxysmal nocturnal dyspnea[ ]   Pulmonary: Cough [ ] ; Wheezing[ ] ; Hemoptysis[ ] ; Sputum [ ] ; Snoring [ ]   GI: Vomiting[ ] ; Dysphagia[ ] ; Melena[ ] ; Hematochezia [ ] ; Heartburn[ ] ; Abdominal pain [ ] ; Constipation [ ] ; Diarrhea [ ] ; BRBPR [ ]   GU: Hematuria[ ] ; Dysuria [ ] ; Nocturia[ ]   Vascular: Pain in legs with walking [ ] ; Pain in feet with lying flat [ ] ; Non-healing sores [ ] ; Stroke [ ] ; TIA [ ] ; Slurred speech [ ] ;  Neuro: Headaches[ ] ; Vertigo[ ] ; Seizures[ ] ; Paresthesias[ ] ;Blurred vision [ ] ; Diplopia [ ] ; Vision changes [ ]   Ortho/Skin: Arthritis [ ] ; Joint pain [ ] ; Muscle pain [ ] ; Joint swelling [ ] ; Back Pain [ ] ; Rash [ ]   Psych: Depression[ ] ; Anxiety[ ]   Heme: Bleeding problems [ ] ; Clotting disorders [ ] ; Anemia [ ]   Endocrine: Diabetes [ ] ; Thyroid dysfunction[ ]    Past  Medical History:  Diagnosis Date   Asthma    Gastric ulcer    Hepatitis C    Hypertension     Current Outpatient Medications  Medication Sig Dispense Refill   cariprazine (VRAYLAR) 3 MG capsule Take 1 capsule (3 mg total) by mouth daily. 30 capsule 0   clotrimazole (LOTRIMIN) 1 % cream Apply topically 2 (two) times daily. 30 g 0   dapagliflozin propanediol (FARXIGA) 10 MG TABS tablet Take 1 tablet (10 mg total) by mouth daily. 30 tablet 0   gabapentin (NEURONTIN) 300 MG capsule Take 1 capsule (300 mg total) by mouth 3 (three) times daily. 90 capsule 0    hydrOXYzine (ATARAX) 25 MG tablet Take 1 tablet (25 mg total) by mouth 3 (three) times daily as needed for anxiety. 30 tablet 0   menthol-cetylpyridinium (CEPACOL) 3 MG lozenge Take 1 lozenge (3 mg total) by mouth as needed for sore throat. 100 tablet 12   metoprolol succinate (TOPROL-XL) 25 MG 24 hr tablet Take 0.5 tablets (12.5 mg total) by mouth daily. 15 tablet 0   Multiple Vitamins-Minerals (MULTIVITAMIN WITH MINERALS) tablet Take 1 tablet by mouth daily.     sacubitril-valsartan (ENTRESTO) 24-26 MG Take 1 tablet by mouth 2 (two) times daily. 60 tablet 0   spironolactone (ALDACTONE) 25 MG tablet Take 0.5 tablets (12.5 mg total) by mouth daily. 15 tablet 0   thiamine (VITAMIN B1) 100 MG tablet Take 1 tablet (100 mg total) by mouth daily. 30 tablet 0   traZODone (DESYREL) 100 MG tablet Take 1 tablet (100 mg total) by mouth at bedtime. 30 tablet 0   No current facility-administered medications for this visit.    Allergies  Allergen Reactions   Tylenol [Acetaminophen] Rash      Social History   Socioeconomic History   Marital status: Single    Spouse name: Not on file   Number of children: 0   Years of education: Not on file   Highest education level: Not on file  Occupational History   Not on file  Tobacco Use   Smoking status: Former    Types: Cigarettes   Smokeless tobacco: Never  Vaping Use   Vaping Use: Former  Substance and Sexual Activity   Alcohol use: Yes    Alcohol/week: 2.0 standard drinks of alcohol    Types: 2 Glasses of wine per week   Drug use: Yes    Types: IV, "Crack" cocaine, Cocaine, Fentanyl, Heroin, MDMA (Ecstacy)    Comment: Polysubstance Use hx. Pt reports more recent has just been Cocaine and Heroin.   Sexual activity: Not Currently  Other Topics Concern   Not on file  Social History Narrative   Not on file   Social Determinants of Health   Financial Resource Strain: Low Risk  (03/23/2022)   Overall Financial Resource Strain (CARDIA)     Difficulty of Paying Living Expenses: Not very hard  Food Insecurity: No Food Insecurity (03/25/2022)   Hunger Vital Sign    Worried About Running Out of Food in the Last Year: Never true    Ran Out of Food in the Last Year: Never true  Transportation Needs: No Transportation Needs (03/25/2022)   PRAPARE - Hydrologist (Medical): No    Lack of Transportation (Non-Medical): No  Physical Activity: Not on file  Stress: Not on file  Social Connections: Not on file  Intimate Partner Violence: Not At Risk (03/25/2022)   Humiliation, Afraid, Rape, and Kick questionnaire  Fear of Current or Ex-Partner: No    Emotionally Abused: No    Physically Abused: No    Sexually Abused: No      Family History  Problem Relation Age of Onset   Hypertension Father     There were no vitals filed for this visit.  PHYSICAL EXAM: General:  Well appearing. No respiratory difficulty HEENT: normal Neck: supple. no JVD. Carotids 2+ bilat; no bruits. No lymphadenopathy or thryomegaly appreciated. Cor: PMI nondisplaced. Regular rate & rhythm. No rubs, gallops or murmurs. Lungs: clear Abdomen: soft, nontender, nondistended. No hepatosplenomegaly. No bruits or masses. Good bowel sounds. Extremities: no cyanosis, clubbing, rash, edema Neuro: alert & oriented x 3, cranial nerves grossly intact. moves all 4 extremities w/o difficulty. Affect pleasant.  ECG:   ASSESSMENT & PLAN:  1. Chronic Systolic Heart Failure - Echo 12/23 w/ mod-severely reduced LVEF, 30-35%, RV mildly reduced. 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 UDS + for Barbiturates, benzodiazepine, opiates & THC. Hs trops during admission were mildly elevated, peaking at 387, however felt to be due to demand ischemia. He has very little risk factors for CAD and no h/o anginal symptoms.  - focus currently is titration of GDMT and avoidance of further drug use. Plan repeat echo in ~3 months. If  EF fails to improve, would recommend cMRI +/- LHC   NYHA Class ***   - Farxiga 10 mg  - Entresto 24-26 mg bid - Spiro 12.5 mg  - Toprol XL 12.5 mg daily   2. Polysubstance Use - suspect contributing to cardiomyopathy  - imperative to refrain from further use   3. Psych  - h/o SI and recent attempted drug overdose - completed inpatient psych treatment w/ adjustment to psychiatric medication regimen - followed by outpatient BH - QTC on today's EKG ***    NYHA *** GDMT  Diuretic- BB- Ace/ARB/ARNI MRA SGLT2i    Referred to HFSW (PCP, Medications, Transportation, ETOH Abuse, Drug Abuse, Insurance, Financial ): Yes or No Refer to Pharmacy: Yes or No Refer to Home Health: Yes on No Refer to Advanced Heart Failure Clinic: Yes or no  Refer to General Cardiology: Yes or No  Follow up

## 2022-05-14 ENCOUNTER — Telehealth (HOSPITAL_COMMUNITY): Payer: Self-pay | Admitting: Licensed Clinical Social Worker

## 2022-05-14 NOTE — Telephone Encounter (Signed)
CSW called pt to inquire if he needed samples of farxiga and entresto that was left at front desk over a month ago.  Pt reports he continues not to have medications.  CSW reviewed chart and saw that pt has not completed appt with [place] so informed pt I would need to reach out to clinical staff about getting rescheduled so we could confirm what medications he should be on- informed pt we would not be able to give him samples until he was seen by [place] in clinic- pt expressed understanding.  Clinic staff to follow up in rescheduling  [name], [facility] Desk#: [phone] Cell#: [phone]

## 2022-05-17 ENCOUNTER — Ambulatory Visit: Payer: BLUE CROSS/BLUE SHIELD | Admitting: Family Medicine

## 2022-05-31 ENCOUNTER — Encounter (HOSPITAL_COMMUNITY): Payer: Self-pay

## 2022-05-31 ENCOUNTER — Emergency Department (HOSPITAL_COMMUNITY): Payer: 59

## 2022-05-31 ENCOUNTER — Emergency Department (HOSPITAL_COMMUNITY)
Admission: EM | Admit: 2022-05-31 | Discharge: 2022-05-31 | Disposition: A | Discharge status: Home / Self Care | Payer: 59 | Attending: Emergency Medicine | Admitting: Emergency Medicine

## 2022-05-31 DIAGNOSIS — I10 Essential (primary) hypertension: Secondary | ICD-10-CM | POA: Diagnosis not present

## 2022-05-31 DIAGNOSIS — Z79899 Other long term (current) drug therapy: Secondary | ICD-10-CM | POA: Diagnosis not present

## 2022-05-31 DIAGNOSIS — M7989 Other specified soft tissue disorders: Secondary | ICD-10-CM | POA: Diagnosis not present

## 2022-05-31 DIAGNOSIS — J45909 Unspecified asthma, uncomplicated: Secondary | ICD-10-CM | POA: Diagnosis not present

## 2022-05-31 DIAGNOSIS — L03113 Cellulitis of right upper limb: Secondary | ICD-10-CM | POA: Insufficient documentation

## 2022-05-31 DIAGNOSIS — M79641 Pain in right hand: Secondary | ICD-10-CM | POA: Diagnosis not present

## 2022-05-31 LAB — COMPREHENSIVE METABOLIC PANEL
ALT: 13 U/L (ref 0–44)
AST: 26 U/L (ref 15–41)
Albumin: 4 g/dL (ref 3.5–5.0)
Alkaline Phosphatase: 60 U/L (ref 38–126)
Anion gap: 6 (ref 5–15)
BUN: 12 mg/dL (ref 6–20)
CO2: 25 mmol/L (ref 22–32)
Calcium: 8.6 mg/dL — ABNORMAL LOW (ref 8.9–10.3)
Chloride: 103 mmol/L (ref 98–111)
Creatinine, Ser: 0.69 mg/dL (ref 0.61–1.24)
GFR, Estimated: >60 mL/min (ref >60)
Glucose, Bld: 89 mg/dL (ref 70–99)
Potassium: 4.3 mmol/L (ref 3.5–5.1)
Sodium: 134 mmol/L — ABNORMAL LOW (ref 135–145)
Total Bilirubin: 1.1 mg/dL (ref 0.3–1.2)
Total Protein: 7.8 g/dL (ref 6.5–8.1)

## 2022-05-31 LAB — CBC WITH DIFFERENTIAL/PLATELET
Abs Immature Granulocytes: 0.07 10*3/uL (ref 0.00–0.07)
Basophils Absolute: 0.1 10*3/uL (ref 0.0–0.1)
Basophils Relative: 0 %
Eosinophils Absolute: 0.1 10*3/uL (ref 0.0–0.5)
Eosinophils Relative: 1 %
HCT: 43.7 % (ref 39.0–52.0)
Hemoglobin: 15 g/dL (ref 13.0–17.0)
Immature Granulocytes: 0 %
Lymphocytes Relative: 13 %
Lymphs Abs: 2.7 10*3/uL (ref 0.7–4.0)
MCH: 30.5 pg (ref 26.0–34.0)
MCHC: 34.3 g/dL (ref 30.0–36.0)
MCV: 88.8 fL (ref 80.0–100.0)
Monocytes Absolute: 1.7 10*3/uL — ABNORMAL HIGH (ref 0.1–1.0)
Monocytes Relative: 9 %
Neutro Abs: 15.8 10*3/uL — ABNORMAL HIGH (ref 1.7–7.7)
Neutrophils Relative %: 77 %
Platelets: 304 10*3/uL (ref 150–400)
RBC: 4.92 MIL/uL (ref 4.22–5.81)
RDW: 13.1 % (ref 11.5–15.5)
WBC: 20.5 10*3/uL — ABNORMAL HIGH (ref 4.0–10.5)
nRBC: 0 % (ref 0.0–0.2)

## 2022-05-31 LAB — LACTIC ACID, PLASMA: Lactic Acid, Venous: 1.2 mmol/L (ref 0.5–1.9)

## 2022-05-31 MED ORDER — LIDOCAINE-EPINEPHRINE-TETRACAINE (LET) TOPICAL GEL
3.0000 mL | Freq: Once | TOPICAL | Status: AC
  Administered 2022-05-31: 3 mL via TOPICAL
  Filled 2022-05-31: qty 3

## 2022-05-31 MED ORDER — IBUPROFEN 600 MG PO TABS: 600.0000 mg | ORAL_TABLET | Freq: Four times a day (QID) | ORAL | 0 refills | Status: AC | PRN

## 2022-05-31 MED ORDER — MORPHINE SULFATE (PF) 4 MG/ML IV SOLN
4.0000 mg | Freq: Once | INTRAVENOUS | Status: AC
  Administered 2022-05-31: 4 mg via INTRAVENOUS
  Filled 2022-05-31: qty 1

## 2022-05-31 MED ORDER — DOXYCYCLINE HYCLATE 100 MG PO CAPS: 100.0000 mg | ORAL_CAPSULE | Freq: Two times a day (BID) | ORAL | 0 refills | Status: AC

## 2022-05-31 MED ORDER — DOXYCYCLINE HYCLATE 100 MG PO TABS
100.0000 mg | ORAL_TABLET | Freq: Once | ORAL | Status: AC
  Administered 2022-05-31: 100 mg via ORAL
  Filled 2022-05-31: qty 1

## 2022-05-31 MED ORDER — HYDROMORPHONE HCL 1 MG/ML IJ SOLN
1.0000 mg | Freq: Once | INTRAMUSCULAR | Status: AC
  Administered 2022-05-31: 1 mg via INTRAVENOUS
  Filled 2022-05-31: qty 1

## 2022-05-31 MED ORDER — OXYCODONE HCL 5 MG PO TABS: 5.0000 mg | ORAL_TABLET | Freq: Three times a day (TID) | ORAL | 0 refills | Status: AC | PRN

## 2022-05-31 NOTE — ED Provider Notes (Signed)
[name] [facility] Provider Note   CSN: [id number] Arrival date & time: 05/31/22  1400     History  Chief Complaint  Patient presents with   Hand Pain    Jared [name] is a 42 y.o. male.  With history of hepatitis C, hypertension, asthma, gastric ulcers, polysubstance abuse who presents to the ED for evaluation of right hand pain.  States it began 5 days ago and has progressively gotten worse.  Localizes it to the right thumb.  Denies any known injury to the area.  States yesterday he noticed some purulent drainage from the dorsal aspect of the DIP of the right thumb.  Denies fevers, chills, night sweats, shortness of breath, numbness or tingling.  Describes the pain as severe.  Has a history of injection drug use with last use more than 1 year ago.  Typically used heroin when he did inject drugs.   Hand Pain       Home Medications Prior to Admission medications   Medication Sig Start Date End Date Taking? Authorizing Provider  doxycycline (VIBRAMYCIN) 100 MG capsule Take 1 capsule (100 mg total) by mouth 2 (two) times daily for 10 days. 05/31/22 06/10/22 Yes Trifan, Carola Rhine, MD  ibuprofen (ADVIL) 600 MG tablet Take 1 tablet (600 mg total) by mouth every 6 (six) hours as needed. 05/31/22 06/30/22 Yes Trifan, Carola Rhine, MD  oxyCODONE (ROXICODONE) 5 MG immediate release tablet Take 1 tablet (5 mg total) by mouth every 8 (eight) hours as needed for up to 3 doses for severe pain. 05/31/22  Yes Trifan, Carola Rhine, MD  cariprazine (VRAYLAR) 3 MG capsule Take 1 capsule (3 mg total) by mouth daily. 04/01/22   Nicholes Rough, NP  clotrimazole (LOTRIMIN) 1 % cream Apply topically 2 (two) times daily. 04/01/22   Nicholes Rough, NP  dapagliflozin propanediol (FARXIGA) 10 MG TABS tablet Take 1 tablet (10 mg total) by mouth daily. 04/02/22   Nicholes Rough, NP  gabapentin (NEURONTIN) 300 MG capsule Take 1 capsule (300 mg total) by mouth 3 (three) times daily. 04/01/22    Nicholes Rough, NP  hydrOXYzine (ATARAX) 25 MG tablet Take 1 tablet (25 mg total) by mouth 3 (three) times daily as needed for anxiety. 04/01/22   Nicholes Rough, NP  menthol-cetylpyridinium (CEPACOL) 3 MG lozenge Take 1 lozenge (3 mg total) by mouth as needed for sore throat. 04/01/22   Nicholes Rough, NP  metoprolol succinate (TOPROL-XL) 25 MG 24 hr tablet Take 0.5 tablets (12.5 mg total) by mouth daily. 04/02/22   Nicholes Rough, NP  Multiple Vitamins-Minerals (MULTIVITAMIN WITH MINERALS) tablet Take 1 tablet by mouth daily.    [provider]  sacubitril-valsartan (ENTRESTO) 24-26 MG Take 1 tablet by mouth 2 (two) times daily. 04/01/22   Nicholes Rough, NP  spironolactone (ALDACTONE) 25 MG tablet Take 0.5 tablets (12.5 mg total) by mouth daily. 04/02/22   Nicholes Rough, NP  thiamine (VITAMIN B1) 100 MG tablet Take 1 tablet (100 mg total) by mouth daily. 03/24/22   Thurnell Lose, MD  traZODone (DESYREL) 100 MG tablet Take 1 tablet (100 mg total) by mouth at bedtime. 04/01/22   Nicholes Rough, NP      Allergies    Tylenol [acetaminophen]    Review of Systems   Review of Systems  Musculoskeletal:  Positive for arthralgias.  Skin:  Positive for wound.  All other systems reviewed and are negative.   Physical Exam Updated Vital Signs BP 106/74  Pulse 78   Temp 98.2 F (36.8 C) (Oral)   Resp 18   SpO2 99%  Physical Exam Vitals and nursing note reviewed.  Constitutional:      General: He is not in acute distress.    Appearance: Normal appearance. He is normal weight. He is not ill-appearing.  HENT:     Head: Normocephalic and atraumatic.  Cardiovascular:     Rate and Rhythm: Normal rate and regular rhythm.  Pulmonary:     Effort: Pulmonary effort is normal. No respiratory distress.     Breath sounds: No stridor. No wheezing, rhonchi or rales.  Abdominal:     General: Abdomen is flat.  Musculoskeletal:        General: Normal range of motion.     Cervical back: Neck supple.      Comments: Significant swelling of the right thumb.  Decreased flexion range of motion of the right thumb.  Preserved range of motion of all other fingers.  Significant tenderness to palpation  Skin:    General: Skin is warm and dry.     Capillary Refill: Capillary refill takes less than 2 seconds.     Comments: Significant erythema to the right thumb with surrounding erythema of the dorsal aspect of the right hand extending to the wrist.  Small open wound with scant purulent drainage noted  Neurological:     Mental Status: He is alert and oriented to person, place, and time.  Psychiatric:        Mood and Affect: Mood normal.        Behavior: Behavior normal.     ED Results / Procedures / Treatments   Labs (all labs ordered are listed, but only abnormal results are displayed) Labs Reviewed  CBC WITH DIFFERENTIAL/PLATELET - Abnormal; Notable for the following components:      Result Value   WBC 20.5 (*)    Neutro Abs 15.8 (*)    Monocytes Absolute 1.7 (*)    All other components within normal limits  COMPREHENSIVE METABOLIC PANEL - Abnormal; Notable for the following components:   Sodium 134 (*)    Calcium 8.6 (*)    All other components within normal limits  LACTIC ACID, PLASMA    EKG None  Radiology DG Hand Complete Right  Result Date: 05/31/2022 CLINICAL DATA:  Erythema and swelling along the thumb region EXAM: RIGHT HAND - COMPLETE 3+ VIEW COMPARISON:  02/16/2016 FINDINGS: Dorsal soft tissue swelling along the first metacarpal and proximal phalanx of the thumb. No gas or foreign body identified. No fracture or acute bony findings observed. IMPRESSION: 1. Dorsal soft tissue swelling along the first metacarpal and proximal phalanx of the thumb. No gas or foreign body identified. Electronically Signed   By: Van Clines M.D.   On: 05/31/2022 14:35    Procedures Procedures    Medications Ordered in ED Medications  morphine (PF) 4 MG/ML injection 4 mg (4 mg  Intravenous Given 05/31/22 1457)  HYDROmorphone (DILAUDID) injection 1 mg (1 mg Intravenous Given 05/31/22 1624)  lidocaine-EPINEPHrine-tetracaine (LET) topical gel (3 mLs Topical Given 05/31/22 1624)  doxycycline (VIBRA-TABS) tablet 100 mg (100 mg Oral Given 05/31/22 1749)    ED Course/ Medical Decision Making/ A&P                             Medical Decision Making Risk Prescription drug management.  This patient presents to the ED for concern of right hand pain  and swelling, this involves an extensive number of treatment options, and is a complaint that carries with it a high risk of complications and morbidity.  The differential diagnosis includes cellulitis, sepsis/bacteremia, osteomyelitis, tenosynovitis  Co morbidities that complicate the patient evaluation  hepatitis C, hypertension, asthma, gastric ulcers, polysubstance abuse  My initial workup includes infection labs, x-ray hand, pain control  Additional history obtained from: Nursing notes from this visit  I ordered, reviewed and interpreted labs which include: Lactate, CBC, CMP.  Leukocytosis of 20.4.  Labs otherwise reassuring.  Lactate normal  I ordered imaging studies including x-ray right hand I independently visualized and interpreted imaging which showed dorsal soft tissue swelling along the first metacarpal and proximal phalanx of the thumb I agree with the radiologist interpretation  Afebrile, hemodynamically stable.  42 year old male presents ED for evaluation of infection to the right thumb and hand.  On exam he has some significant swelling and erythema to the right thumb which extends to the wrist and to the palmar aspect of the thumb.  There is a small fluctuant mass overlying the DIP.  This was incised with a stab incision in the ED with scant purulent drainage.  Patient was started on clindamycin earlier today by an online provider.  He was switched to doxycycline today and given his first dose in the ED.   Prescription was sent.  Initially believe patient was a candidate for IV antibiotics and admission due to possibility of tenosynovitis, however he adamantly declines admission.  Possible outcomes were discussed with patient and he still declines.  He was given contact information for hand surgery and encouraged to follow-up.  At this time there does not appear to be any evidence of an acute emergency medical condition and the patient appears stable for discharge with appropriate outpatient follow up. Diagnosis was discussed with patient who verbalizes understanding of care plan and is agreeable to discharge. I have discussed return precautions with patient and mother who verbalizes understanding. Patient encouraged to follow-up with their PCP within 1 week. All questions answered.  Patient's case discussed with Dr. Langston Masker who agrees with plan to discharge with follow-up.   Note: Portions of this report may have been transcribed using voice recognition software. Every effort was made to ensure accuracy; however, inadvertent computerized transcription errors may still be present.        Final Clinical Impression(s) / ED Diagnoses Final diagnoses:  Cellulitis of hand, right    Rx / DC Orders ED Discharge Orders          Ordered    doxycycline (VIBRAMYCIN) 100 MG capsule  2 times daily        05/31/22 1605    oxyCODONE (ROXICODONE) 5 MG immediate release tablet  Every 8 hours PRN        05/31/22 1605    ibuprofen (ADVIL) 600 MG tablet  Every 6 hours PRN        05/31/22 1605              Roylene Reason, Hershal Coria 05/31/22 1815    Wyvonnia Dusky, MD 06/01/22 [phone]

## 2022-05-31 NOTE — Discharge Instructions (Signed)
You have been seen today for your complaint of an infection. Your lab work showed a high white blood cell count which is indicative of an infection, but was otherwise reassuring. Your discharge medications include doxycycline. This is an antibiotic. You should take it as prescribed. You should take it for the entire duration of the prescription. This may cause an upset stomach. This is normal. You may take this with food. You may also eat yogurt to prevent diarrhea. Oxycodone. This is an opioid pain medication. You should only take this medication as needed for severe pain. You should not drive, operate heavy machinery or make important decisions while taking this medication. You should use alternative methods for pain relief while taking this medication including stretching, gentle range of motion, and alternating tylenol and ibuprofen. Follow up with: Dr. Tempie Donning.  He is a Copy.  You should call either today or tomorrow morning to schedule an appointment for the soonest available for an ED follow-up visit Please seek immediate medical care if you develop any of the following symptoms: You notice red streaks coming from the infected area. You notice the skin turns purple or black and falls off. At this time there does not appear to be the presence of an emergent medical condition, however there is always the potential for conditions to change. Please read and follow the below instructions.  Do not take your medicine if  develop an itchy rash, swelling in your mouth or lips, or difficulty breathing; call 911 and seek immediate emergency medical attention if this occurs.  You may review your lab tests and imaging results in their entirety on your MyChart account.  Please discuss all results of fully with your primary care provider and other specialist at your follow-up visit.  Note: Portions of this text may have been transcribed using voice recognition software. Every effort was made to ensure  accuracy; however, inadvertent computerized transcription errors may still be present.

## 2022-05-31 NOTE — ED Triage Notes (Signed)
Pt presents with c/o right hand pain. Pt reports he noticed a spot on his hand approx 5 days ago and since then it has turned red and is swelling.

## 2022-06-11 DIAGNOSIS — F17290 Nicotine dependence, other tobacco product, uncomplicated: Secondary | ICD-10-CM | POA: Diagnosis not present

## 2022-06-11 DIAGNOSIS — F1120 Opioid dependence, uncomplicated: Secondary | ICD-10-CM | POA: Diagnosis not present

## 2022-06-18 DIAGNOSIS — F17290 Nicotine dependence, other tobacco product, uncomplicated: Secondary | ICD-10-CM | POA: Diagnosis not present

## 2022-06-18 DIAGNOSIS — F1120 Opioid dependence, uncomplicated: Secondary | ICD-10-CM | POA: Diagnosis not present
